# Patient Record
Sex: Male | Born: 1940
Health system: Southern US, Community
[De-identification: ages and names within clinical notes are randomized; demographics above are authoritative.]

## PROBLEM LIST (undated history)

## (undated) DIAGNOSIS — H3530 Unspecified macular degeneration: Secondary | ICD-10-CM

## (undated) DIAGNOSIS — J45909 Unspecified asthma, uncomplicated: Secondary | ICD-10-CM

## (undated) DIAGNOSIS — H9190 Unspecified hearing loss, unspecified ear: Secondary | ICD-10-CM

## (undated) DIAGNOSIS — M722 Plantar fascial fibromatosis: Secondary | ICD-10-CM

## (undated) DIAGNOSIS — E119 Type 2 diabetes mellitus without complications: Secondary | ICD-10-CM

## (undated) HISTORY — PX: EYE SURGERY: SHX253

## (undated) HISTORY — DX: Type 2 diabetes mellitus without complications: E11.9

## (undated) HISTORY — PX: HEMORRHOID SURGERY: SHX153

---

## 1999-04-18 ENCOUNTER — Encounter: Payer: Self-pay | Admitting: Emergency Medicine

## 1999-04-18 ENCOUNTER — Emergency Department (HOSPITAL_COMMUNITY): Admission: EM | Admit: 1999-04-18 | Discharge: 1999-04-18 | Payer: Self-pay | Admitting: Emergency Medicine

## 1999-04-28 ENCOUNTER — Ambulatory Visit (HOSPITAL_COMMUNITY): Admission: RE | Admit: 1999-04-28 | Discharge: 1999-04-28 | Payer: Self-pay | Admitting: Internal Medicine

## 1999-04-28 ENCOUNTER — Encounter: Payer: Self-pay | Admitting: Internal Medicine

## 2001-08-29 ENCOUNTER — Other Ambulatory Visit: Admission: RE | Admit: 2001-08-29 | Discharge: 2001-08-29 | Payer: Self-pay | Admitting: Otolaryngology

## 2001-08-29 ENCOUNTER — Encounter (INDEPENDENT_AMBULATORY_CARE_PROVIDER_SITE_OTHER): Payer: Self-pay | Admitting: Specialist

## 2001-12-11 ENCOUNTER — Ambulatory Visit (HOSPITAL_COMMUNITY): Admission: RE | Admit: 2001-12-11 | Discharge: 2001-12-11 | Payer: Self-pay | Admitting: Ophthalmology

## 2005-06-07 ENCOUNTER — Ambulatory Visit: Payer: Self-pay | Admitting: Pulmonary Disease

## 2009-10-27 ENCOUNTER — Encounter: Admission: RE | Admit: 2009-10-27 | Discharge: 2009-10-27 | Payer: Self-pay | Admitting: Family Medicine

## 2012-01-12 DIAGNOSIS — H3530 Unspecified macular degeneration: Secondary | ICD-10-CM | POA: Diagnosis not present

## 2012-03-09 DIAGNOSIS — L259 Unspecified contact dermatitis, unspecified cause: Secondary | ICD-10-CM | POA: Diagnosis not present

## 2012-08-07 DIAGNOSIS — Z23 Encounter for immunization: Secondary | ICD-10-CM | POA: Diagnosis not present

## 2012-08-13 ENCOUNTER — Emergency Department (HOSPITAL_BASED_OUTPATIENT_CLINIC_OR_DEPARTMENT_OTHER): Payer: Medicare Other

## 2012-08-13 ENCOUNTER — Emergency Department (HOSPITAL_BASED_OUTPATIENT_CLINIC_OR_DEPARTMENT_OTHER)
Admission: EM | Admit: 2012-08-13 | Discharge: 2012-08-13 | Disposition: A | Discharge status: Home / Self Care | Payer: Medicare Other | Attending: Emergency Medicine | Admitting: Emergency Medicine

## 2012-08-13 ENCOUNTER — Encounter (HOSPITAL_BASED_OUTPATIENT_CLINIC_OR_DEPARTMENT_OTHER): Payer: Self-pay | Admitting: Emergency Medicine

## 2012-08-13 DIAGNOSIS — S60229A Contusion of unspecified hand, initial encounter: Secondary | ICD-10-CM | POA: Diagnosis not present

## 2012-08-13 DIAGNOSIS — T148XXA Other injury of unspecified body region, initial encounter: Secondary | ICD-10-CM | POA: Diagnosis not present

## 2012-08-13 DIAGNOSIS — J45909 Unspecified asthma, uncomplicated: Secondary | ICD-10-CM | POA: Diagnosis not present

## 2012-08-13 DIAGNOSIS — IMO0002 Reserved for concepts with insufficient information to code with codable children: Secondary | ICD-10-CM | POA: Insufficient documentation

## 2012-08-13 DIAGNOSIS — M79609 Pain in unspecified limb: Secondary | ICD-10-CM | POA: Diagnosis not present

## 2012-08-13 DIAGNOSIS — R220 Localized swelling, mass and lump, head: Secondary | ICD-10-CM | POA: Diagnosis not present

## 2012-08-13 DIAGNOSIS — Y92009 Unspecified place in unspecified non-institutional (private) residence as the place of occurrence of the external cause: Secondary | ICD-10-CM | POA: Insufficient documentation

## 2012-08-13 DIAGNOSIS — Z79899 Other long term (current) drug therapy: Secondary | ICD-10-CM | POA: Diagnosis not present

## 2012-08-13 DIAGNOSIS — S199XXA Unspecified injury of neck, initial encounter: Secondary | ICD-10-CM | POA: Diagnosis not present

## 2012-08-13 DIAGNOSIS — R221 Localized swelling, mass and lump, neck: Secondary | ICD-10-CM | POA: Diagnosis not present

## 2012-08-13 DIAGNOSIS — S61409A Unspecified open wound of unspecified hand, initial encounter: Secondary | ICD-10-CM | POA: Diagnosis not present

## 2012-08-13 DIAGNOSIS — T1490XA Injury, unspecified, initial encounter: Secondary | ICD-10-CM | POA: Diagnosis not present

## 2012-08-13 DIAGNOSIS — S0003XA Contusion of scalp, initial encounter: Secondary | ICD-10-CM | POA: Diagnosis not present

## 2012-08-13 DIAGNOSIS — S0083XA Contusion of other part of head, initial encounter: Secondary | ICD-10-CM | POA: Diagnosis not present

## 2012-08-13 DIAGNOSIS — R51 Headache: Secondary | ICD-10-CM | POA: Insufficient documentation

## 2012-08-13 DIAGNOSIS — S0990XA Unspecified injury of head, initial encounter: Secondary | ICD-10-CM | POA: Diagnosis not present

## 2012-08-13 DIAGNOSIS — W010XXA Fall on same level from slipping, tripping and stumbling without subsequent striking against object, initial encounter: Secondary | ICD-10-CM | POA: Insufficient documentation

## 2012-08-13 DIAGNOSIS — M19049 Primary osteoarthritis, unspecified hand: Secondary | ICD-10-CM | POA: Diagnosis not present

## 2012-08-13 DIAGNOSIS — S60222A Contusion of left hand, initial encounter: Secondary | ICD-10-CM

## 2012-08-13 HISTORY — DX: Unspecified asthma, uncomplicated: J45.909

## 2012-08-13 MED ORDER — BACITRACIN ZINC 500 UNIT/GM EX OINT: TOPICAL_OINTMENT | Freq: Two times a day (BID) | CUTANEOUS | Status: DC

## 2012-08-13 MED ORDER — BACITRACIN 500 UNIT/GM EX OINT
1.0000 "application " | TOPICAL_OINTMENT | Freq: Two times a day (BID) | CUTANEOUS | Status: DC
  Administered 2012-08-13: 1 via TOPICAL
  Filled 2012-08-13: qty 0.9

## 2012-08-13 MED ORDER — NAPROXEN 500 MG PO TABS: 500.0000 mg | ORAL_TABLET | Freq: Two times a day (BID) | ORAL | Status: DC

## 2012-08-13 NOTE — ED Provider Notes (Signed)
History     CSN: [id number]  Arrival date & time 08/13/12  1153   First MD Initiated Contact with Patient 08/13/12 1215      Chief Complaint  Patient presents with  . Fall    (Consider location/radiation/quality/duration/timing/severity/associated sxs/prior treatment) HPI Comments: 71 year old male presents after a fall which was mechanical, states that he was walking in his garage when his shoe caught and he fell forward landing on his left hand in his left for head. This was acute in onset, the pain is persistent, gradually improving, mild at this time and not associated with weakness, numbness, nausea, vomiting, blurred vision, dizziness. He denies chest pain, shortness of breath, back or neck pain. He was immobilized with a backboard and c-collar prior to arrival by ambulance and had dressings placed over the dorsum of his left hand and his left forehead.  Patient is a 71 y.o. male presenting with fall. The history is provided by the patient and the EMS personnel.  Fall The accident occurred less than 1 hour ago.    Past Medical History  Diagnosis Date  . Asthma     No past surgical history on file.  No family history on file.  History  Substance Use Topics  . Smoking status: Not on file  . Smokeless tobacco: Not on file  . Alcohol Use:       Review of Systems  All other systems reviewed and are negative.    Allergies  Codeine and Prednisone  Home Medications   Current Outpatient Rx  Name Route Sig Dispense Refill  . ALBUTEROL SULFATE HFA 108 (90 BASE) MCG/ACT IN AERS Inhalation Inhale 2 puffs into the lungs every 6 (six) hours as needed.    Marland Kitchen BACITRACIN ZINC 500 UNIT/GM EX OINT Topical Apply topically 2 (two) times daily. 120 g 0  . NAPROXEN 500 MG PO TABS Oral Take 1 tablet (500 mg total) by mouth 2 (two) times daily with a meal. 30 tablet 0    BP 124/78  Pulse 61  Temp 97.9 F (36.6 C) (Oral)  Resp 17  SpO2 97%  Physical Exam  Nursing note and  vitals reviewed. Constitutional: He appears well-developed and well-nourished. No distress.  HENT:  Head: Normocephalic.  Mouth/Throat: Oropharynx is clear and moist. No oropharyngeal exudate.       Contusion and abrasion over the left upper forehead, no malocclusion, no hemotympanum, no raccoon eyes, no battle sign  Eyes: Conjunctivae normal and EOM are normal. Pupils are equal, round, and reactive to light. Right eye exhibits no discharge. Left eye exhibits no discharge. No scleral icterus.  Neck: Normal range of motion. Neck supple. No JVD present. No thyromegaly present.  Cardiovascular: Normal rate, regular rhythm, normal heart sounds and intact distal pulses.  Exam reveals no gallop and no friction rub.   No murmur heard. Pulmonary/Chest: Effort normal and breath sounds normal. No respiratory distress. He has no wheezes. He has no rales.  Abdominal: Soft. Bowel sounds are normal. He exhibits no distension and no mass. There is no tenderness.  Musculoskeletal: Normal range of motion. He exhibits tenderness ( Minimal tenderness over the dorsum of the left hand with associated small skin tear). He exhibits no edema.  Lymphadenopathy:    He has no cervical adenopathy.  Neurological: He is alert. Coordination normal.       Normal gait, normal balance, normal speech, normal extraocular movements, normal strength of the bilateral upper and lower extremities.  Skin: Skin is warm and dry.  There is erythema ( Small hematoma with associated abrasion to the left upper 4 head, no laceration).  Psychiatric: He has a normal mood and affect. His behavior is normal.    ED Course  Procedures (including critical care time)  Labs Reviewed - No data to display Ct Head Wo Contrast  08/13/2012  *RADIOLOGY REPORT*  Clinical Data: Fall with abrasion to left side of forehead. Headache.  CT HEAD WITHOUT CONTRAST  Technique:  Contiguous axial images were obtained from the base of the skull through the vertex  without contrast.  Comparison: None.  Findings: Bone windows demonstrate mild left frontal scalp soft tissue swelling, including image 43 of series 3.  Minimal mucosal thickening of ethmoid air cells.  No underlying skull fracture. Clear mastoid air cells.  Soft tissue windows demonstrate mild low density in the periventricular white matter likely related to small vessel disease. No  mass lesion, hemorrhage, hydrocephalus, acute infarct, intra-axial, or extra-axial fluid collection.  IMPRESSION:  1.  Mild left frontal scalp soft tissue swelling, without acute intracranial abnormality. 2.  Mild small vessel ischemic change. 3.  Minimal sinus disease.   Original Report Authenticated By: Consuello Bossier, M.D.    Dg Hand Complete Left  08/13/2012  *RADIOLOGY REPORT*  Clinical Data:  Left ring finger pain at proximal interphalangeal joint  LEFT HAND - COMPLETE 3+ VIEW  Comparison: None.  Findings: No acute fracture, or malalignment.  Mild soft tissue swelling about the ring finger proximal interphalangeal joint.  The carpus is intact.  Minimal degenerative changes without focality.  IMPRESSION: Negative radiographs of the left hand   Original Report Authenticated By: HEATH      1. Contusion of scalp   2. Minor head injury   3. Contusion of left hand       MDM  Rule out intracranial hemorrhage, suspect superficial skin injuries, patient has normal balance and neurologic exam at this time. According to medical record the patient is not on anticoagulant therapy   CT scan evaluated by myself, I see no signs of intracranial hemorrhage or fracture. Left hand radiographs reveal no sign of fracture. Patient given reassurance, he has normal mental status and is with family members who will watch him today. Wound care provided bacitracin and sterile dressing.     Vida Roller, MD 08/13/12 1352

## 2012-08-13 NOTE — ED Notes (Signed)
Per EMS:  Pt fell this am, tripped on rug.  Pt has abrasion to forehead and skin tears to left hand.  No LOC.

## 2012-10-05 DIAGNOSIS — H6690 Otitis media, unspecified, unspecified ear: Secondary | ICD-10-CM | POA: Diagnosis not present

## 2013-02-06 DIAGNOSIS — R05 Cough: Secondary | ICD-10-CM | POA: Diagnosis not present

## 2013-02-06 DIAGNOSIS — IMO0002 Reserved for concepts with insufficient information to code with codable children: Secondary | ICD-10-CM | POA: Diagnosis not present

## 2013-02-06 DIAGNOSIS — M79609 Pain in unspecified limb: Secondary | ICD-10-CM | POA: Diagnosis not present

## 2013-02-06 DIAGNOSIS — R059 Cough, unspecified: Secondary | ICD-10-CM | POA: Diagnosis not present

## 2013-02-06 DIAGNOSIS — M1710 Unilateral primary osteoarthritis, unspecified knee: Secondary | ICD-10-CM | POA: Diagnosis not present

## 2013-06-16 ENCOUNTER — Emergency Department (HOSPITAL_BASED_OUTPATIENT_CLINIC_OR_DEPARTMENT_OTHER): Payer: Medicare Other

## 2013-06-16 ENCOUNTER — Encounter (HOSPITAL_BASED_OUTPATIENT_CLINIC_OR_DEPARTMENT_OTHER): Payer: Self-pay | Admitting: *Deleted

## 2013-06-16 ENCOUNTER — Emergency Department (HOSPITAL_BASED_OUTPATIENT_CLINIC_OR_DEPARTMENT_OTHER)
Admission: EM | Admit: 2013-06-16 | Discharge: 2013-06-16 | Disposition: A | Discharge status: Home / Self Care | Payer: Medicare Other | Attending: Emergency Medicine | Admitting: Emergency Medicine

## 2013-06-16 DIAGNOSIS — M7730 Calcaneal spur, unspecified foot: Secondary | ICD-10-CM | POA: Diagnosis not present

## 2013-06-16 DIAGNOSIS — Z8669 Personal history of other diseases of the nervous system and sense organs: Secondary | ICD-10-CM | POA: Diagnosis not present

## 2013-06-16 DIAGNOSIS — M79671 Pain in right foot: Secondary | ICD-10-CM

## 2013-06-16 DIAGNOSIS — Z87891 Personal history of nicotine dependence: Secondary | ICD-10-CM | POA: Diagnosis not present

## 2013-06-16 DIAGNOSIS — M79609 Pain in unspecified limb: Secondary | ICD-10-CM | POA: Insufficient documentation

## 2013-06-16 DIAGNOSIS — J45909 Unspecified asthma, uncomplicated: Secondary | ICD-10-CM | POA: Insufficient documentation

## 2013-06-16 HISTORY — DX: Unspecified macular degeneration: H35.30

## 2013-06-16 MED ORDER — TRAMADOL HCL 50 MG PO TABS: 50.0000 mg | ORAL_TABLET | Freq: Four times a day (QID) | ORAL | Status: DC | PRN

## 2013-06-16 MED ORDER — TRAMADOL HCL 50 MG PO TABS
ORAL_TABLET | ORAL | Status: AC
  Filled 2013-06-16: qty 1

## 2013-06-16 MED ORDER — TRAMADOL HCL 50 MG PO TABS
50.0000 mg | ORAL_TABLET | Freq: Once | ORAL | Status: AC
  Administered 2013-06-16: 50 mg via ORAL

## 2013-06-16 MED ORDER — HYDROCODONE-ACETAMINOPHEN 5-325 MG PO TABS
1.0000 | ORAL_TABLET | Freq: Once | ORAL | Status: DC
  Filled 2013-06-16: qty 1

## 2013-06-16 NOTE — ED Provider Notes (Signed)
CSN: [id number]     Arrival date & time 06/16/13  4403 History  This chart was scribed for Doug Sou, MD by Ladona Ridgel Day, ED scribe. This patient was seen in room MH10/MH10 and the patient's care was started at 1947.   First MD Initiated Contact with Patient 06/16/13 1947     Chief Complaint  Patient presents with  . Foot Pain   Patient is a 72 y.o. male presenting with lower extremity pain. The history is provided by the patient. No language interpreter was used.  Foot Pain This is a new problem. The current episode started more than 2 days ago. The problem occurs constantly. The problem has been gradually worsening. Pertinent negatives include no chest pain, no abdominal pain, no headaches and no shortness of breath. Nothing relieves the symptoms. He has tried acetaminophen for the symptoms. The treatment provided no relief.   HPI Comments: Bryan Turner is a 72 y.o. male who presents to the Emergency Department complaining of constant, gradual onset, gradually worsening, 8/10, right foot pain which he localizes to the dorsal aspect of his foot, onset 1 week ago, no recent injuries. He states minimal pain with ranging his right ankle. He states pain is improved when he is walking but recently has been using a cane for assistance to ambulate. He states taking tylenol w/out relief from his foot pain.  He has a hx of mild asthma, takes no regular medicines. He does not smoke or drink alcohol.  He is allergic to codeine and prednisone.  Past Medical History  Diagnosis Date  . Asthma   . Macular degeneration    Past Surgical History  Procedure Laterality Date  . Eye surgery    . Hemorrhoid surgery     No family history on file. History  Substance Use Topics  . Smoking status: Former Smoker    Quit date: 06/16/1992  . Smokeless tobacco: Never Used  . Alcohol Use: No    Review of Systems  Constitutional: Negative.  Negative for fever and chills.  HENT: Negative.  Negative for  congestion.   Respiratory: Negative.  Negative for shortness of breath.   Cardiovascular: Negative.  Negative for chest pain.  Gastrointestinal: Negative.  Negative for nausea, vomiting and abdominal pain.  Musculoskeletal: Negative.  Negative for back pain.       Right dorsal foot pain  Skin: Negative.  Negative for color change and pallor.  Neurological: Negative.  Negative for weakness and headaches.  Psychiatric/Behavioral: Negative.   All other systems reviewed and are negative.   A complete 10 system review of systems was obtained and all systems are negative except as noted in the HPI and PMH.   Allergies  Aspirin; Codeine; and Prednisone  Home Medications   Current Outpatient Rx  Name  Route  Sig  Dispense  Refill  . albuterol (PROVENTIL HFA;VENTOLIN HFA) 108 (90 BASE) MCG/ACT inhaler   Inhalation   Inhale 2 puffs into the lungs every 6 (six) hours as needed.         . bacitracin ointment   Topical   Apply topically 2 (two) times daily.   120 g   0   . naproxen (NAPROSYN) 500 MG tablet   Oral   Take 1 tablet (500 mg total) by mouth 2 (two) times daily with a meal.   30 tablet   0    Triage Vitals: BP 131/73  Pulse 67  Temp(Src) 98 F (36.7 C) (Oral)  Resp 20  Ht 6' (1.829 m)  Wt 227 lb (102.967 kg)  BMI 30.78 kg/m2  SpO2 98% Physical Exam  Nursing note and vitals reviewed. Constitutional: He appears well-developed and well-nourished.  HENT:  Head: Normocephalic and atraumatic.  Eyes: Conjunctivae are normal. Pupils are equal, round, and reactive to light.  Neck: Neck supple. No tracheal deviation present. No thyromegaly present.  Cardiovascular: Normal rate and regular rhythm.   No murmur heard. Pulmonary/Chest: Effort normal and breath sounds normal.  Abdominal: Soft. Bowel sounds are normal. He exhibits no distension. There is no tenderness.  Musculoskeletal: Normal range of motion. He exhibits tenderness. He exhibits no edema.  Right lower  extremity no swelling no redness no temperature differential in comparison to left lower extremity. DP pulse 2+. He is minimally tender at the dorsum of the foot. Good capillary refill. Patient walks with cane has pain at right foot upon walking.  Neurological: He is alert. Coordination normal.  Skin: Skin is warm and dry. No rash noted.  Psychiatric: He has a normal mood and affect.    ED Course   Procedures (including critical care time) DIAGNOSTIC STUDIES: Oxygen Saturation is 98% on room air, normal by my interpretation.    COORDINATION OF CARE: At 806 PM Discussed treatment plan with patient which includes pain medicine, right foot X-ray. Patient agrees.   Labs Reviewed - No data to display No results found. No diagnosis found. 9:40 PM pain improved after treatment with tramadol. X-ray viewed by me No results found for this or any previous visit. Dg Foot Complete Right  06/16/2013   *RADIOLOGY REPORT*  Clinical Data: 72 year old male with right foot pain.  RIGHT FOOT COMPLETE - 3+ VIEW  Comparison: None  Findings: There is no evidence of fracture, subluxation or dislocation. The Lisfranc joints are intact. No focal bony lesions are present. Vascular calcifications are identified. A small calcaneal spur is noted.  IMPRESSION: No evidence of acute abnormality.  Calcaneal spur.   Original Report Authenticated By: Harmon Pier, M.D.    MDM  Plan prescription tramadol He is instructed to go to the local pharmacy to have an orthotic made for his shoe. ReferralDr, Charlsie Merles as needed  Diagnosis right foot pain I personally performed the services described in this documentation, which was scribed in my presence. The recorded information has been reviewed and considered.   Doug Sou, MD 06/16/13 2144

## 2013-06-16 NOTE — ED Notes (Signed)
C/o right foot pain x 1 week- also having leg spasms- denies injury

## 2013-07-04 DIAGNOSIS — M722 Plantar fascial fibromatosis: Secondary | ICD-10-CM | POA: Diagnosis not present

## 2013-07-11 DIAGNOSIS — M722 Plantar fascial fibromatosis: Secondary | ICD-10-CM | POA: Diagnosis not present

## 2013-08-10 DIAGNOSIS — Z23 Encounter for immunization: Secondary | ICD-10-CM | POA: Diagnosis not present

## 2013-11-06 DIAGNOSIS — R05 Cough: Secondary | ICD-10-CM | POA: Diagnosis not present

## 2013-11-06 DIAGNOSIS — J111 Influenza due to unidentified influenza virus with other respiratory manifestations: Secondary | ICD-10-CM | POA: Diagnosis not present

## 2013-11-07 DIAGNOSIS — J111 Influenza due to unidentified influenza virus with other respiratory manifestations: Secondary | ICD-10-CM | POA: Diagnosis not present

## 2013-11-16 ENCOUNTER — Emergency Department (HOSPITAL_BASED_OUTPATIENT_CLINIC_OR_DEPARTMENT_OTHER)
Admission: EM | Admit: 2013-11-16 | Discharge: 2013-11-16 | Disposition: A | Discharge status: Home / Self Care | Payer: Medicare Other | Attending: Emergency Medicine | Admitting: Emergency Medicine

## 2013-11-16 ENCOUNTER — Emergency Department (HOSPITAL_BASED_OUTPATIENT_CLINIC_OR_DEPARTMENT_OTHER): Payer: Medicare Other

## 2013-11-16 ENCOUNTER — Encounter (HOSPITAL_BASED_OUTPATIENT_CLINIC_OR_DEPARTMENT_OTHER): Payer: Self-pay | Admitting: Emergency Medicine

## 2013-11-16 DIAGNOSIS — Z791 Long term (current) use of non-steroidal anti-inflammatories (NSAID): Secondary | ICD-10-CM | POA: Insufficient documentation

## 2013-11-16 DIAGNOSIS — J45901 Unspecified asthma with (acute) exacerbation: Secondary | ICD-10-CM | POA: Diagnosis not present

## 2013-11-16 DIAGNOSIS — Z87891 Personal history of nicotine dependence: Secondary | ICD-10-CM | POA: Insufficient documentation

## 2013-11-16 DIAGNOSIS — R05 Cough: Secondary | ICD-10-CM | POA: Diagnosis not present

## 2013-11-16 DIAGNOSIS — R9431 Abnormal electrocardiogram [ECG] [EKG]: Secondary | ICD-10-CM | POA: Diagnosis not present

## 2013-11-16 DIAGNOSIS — R6883 Chills (without fever): Secondary | ICD-10-CM | POA: Diagnosis not present

## 2013-11-16 DIAGNOSIS — J209 Acute bronchitis, unspecified: Secondary | ICD-10-CM | POA: Diagnosis not present

## 2013-11-16 DIAGNOSIS — Z8669 Personal history of other diseases of the nervous system and sense organs: Secondary | ICD-10-CM | POA: Insufficient documentation

## 2013-11-16 DIAGNOSIS — Z79899 Other long term (current) drug therapy: Secondary | ICD-10-CM | POA: Insufficient documentation

## 2013-11-16 DIAGNOSIS — R059 Cough, unspecified: Secondary | ICD-10-CM | POA: Diagnosis not present

## 2013-11-16 LAB — CBC WITH DIFFERENTIAL/PLATELET
BASOS ABS: 0 10*3/uL (ref 0.0–0.1)
Basophils Relative: 0 % (ref 0–1)
Eosinophils Absolute: 0.1 10*3/uL (ref 0.0–0.7)
Eosinophils Relative: 0 % (ref 0–5)
HEMATOCRIT: 40.6 % (ref 39.0–52.0)
HEMOGLOBIN: 13.8 g/dL (ref 13.0–17.0)
LYMPHS PCT: 30 % (ref 12–46)
Lymphs Abs: 5.7 10*3/uL — ABNORMAL HIGH (ref 0.7–4.0)
MCH: 30.6 pg (ref 26.0–34.0)
MCHC: 34 g/dL (ref 30.0–36.0)
MCV: 90 fL (ref 78.0–100.0)
MONO ABS: 1.8 10*3/uL — AB (ref 0.1–1.0)
MONOS PCT: 9 % (ref 3–12)
NEUTROS ABS: 11.6 10*3/uL — AB (ref 1.7–7.7)
Neutrophils Relative %: 61 % (ref 43–77)
Platelets: 221 10*3/uL (ref 150–400)
RBC: 4.51 MIL/uL (ref 4.22–5.81)
RDW: 12.7 % (ref 11.5–15.5)
WBC: 19.2 10*3/uL — AB (ref 4.0–10.5)

## 2013-11-16 LAB — COMPREHENSIVE METABOLIC PANEL
ALK PHOS: 52 U/L (ref 39–117)
ALT: 16 U/L (ref 0–53)
AST: 20 U/L (ref 0–37)
Albumin: 3.4 g/dL — ABNORMAL LOW (ref 3.5–5.2)
BILIRUBIN TOTAL: 1.3 mg/dL — AB (ref 0.3–1.2)
BUN: 11 mg/dL (ref 6–23)
CHLORIDE: 100 meq/L (ref 96–112)
CO2: 22 meq/L (ref 19–32)
CREATININE: 1 mg/dL (ref 0.50–1.35)
Calcium: 8.9 mg/dL (ref 8.4–10.5)
GFR calc Af Amer: 84 mL/min — ABNORMAL LOW (ref >90)
GFR, EST NON AFRICAN AMERICAN: 73 mL/min — AB (ref >90)
Glucose, Bld: 244 mg/dL — ABNORMAL HIGH (ref 70–99)
Potassium: 3.9 mEq/L (ref 3.7–5.3)
Sodium: 140 mEq/L (ref 137–147)
Total Protein: 6.9 g/dL (ref 6.0–8.3)

## 2013-11-16 LAB — TROPONIN I: Troponin I: <0.3 ng/mL (ref <0.30)

## 2013-11-16 MED ORDER — ALBUTEROL SULFATE HFA 108 (90 BASE) MCG/ACT IN AERS
2.0000 | INHALATION_SPRAY | RESPIRATORY_TRACT | Status: DC | PRN
  Administered 2013-11-16: 2 via RESPIRATORY_TRACT
  Filled 2013-11-16: qty 6.7

## 2013-11-16 MED ORDER — IPRATROPIUM BROMIDE 0.02 % IN SOLN
0.5000 mg | Freq: Once | RESPIRATORY_TRACT | Status: AC
  Administered 2013-11-16: 0.5 mg via RESPIRATORY_TRACT
  Filled 2013-11-16: qty 2.5

## 2013-11-16 MED ORDER — AZITHROMYCIN 250 MG PO TABS: ORAL_TABLET | ORAL | Status: DC

## 2013-11-16 MED ORDER — ALBUTEROL SULFATE (2.5 MG/3ML) 0.083% IN NEBU
5.0000 mg | INHALATION_SOLUTION | Freq: Once | RESPIRATORY_TRACT | Status: AC
  Administered 2013-11-16: 5 mg via RESPIRATORY_TRACT
  Filled 2013-11-16: qty 6

## 2013-11-16 NOTE — ED Notes (Signed)
Coughing and wheezing for the last twenty four hours.  Recently diagnosed with the flu which had resolved.

## 2013-11-16 NOTE — Discharge Instructions (Signed)
Zithromax as prescribed.  Albuterol inhaler 2 puffs every 4 hours as needed for wheezing.  Return to the emergency department if you develop chest pain, difficulty breathing, or other new or concerning symptoms.   Acute Bronchitis Bronchitis is inflammation of the airways that extend from the windpipe into the lungs (bronchi). The inflammation often causes mucus to develop. This leads to a cough, which is the most common symptom of bronchitis.  In acute bronchitis, the condition usually develops suddenly and goes away over time, usually in a couple weeks. Smoking, allergies, and asthma can make bronchitis worse. Repeated episodes of bronchitis may cause further lung problems.  CAUSES Acute bronchitis is most often caused by the same virus that causes a cold. The virus can spread from person to person (contagious).  SIGNS AND SYMPTOMS   Cough.   Fever.   Coughing up mucus.   Body aches.   Chest congestion.   Chills.   Shortness of breath.   Sore throat.  DIAGNOSIS  Acute bronchitis is usually diagnosed through a physical exam. Tests, such as chest X-rays, are sometimes done to rule out other conditions.  TREATMENT  Acute bronchitis usually goes away in a couple weeks. Often times, no medical treatment is necessary. Medicines are sometimes given for relief of fever or cough. Antibiotics are usually not needed but may be prescribed in certain situations. In some cases, an inhaler may be recommended to help reduce shortness of breath and control the cough. A cool mist vaporizer may also be used to help thin bronchial secretions and make it easier to clear the chest.  HOME CARE INSTRUCTIONS  Get plenty of rest.   Drink enough fluids to keep your urine clear or pale yellow (unless you have a medical condition that requires fluid restriction). Increasing fluids may help thin your secretions and will prevent dehydration.   Only take over-the-counter or prescription medicines  as directed by your health care provider.   Avoid smoking and secondhand smoke. Exposure to cigarette smoke or irritating chemicals will make bronchitis worse. If you are a smoker, consider using nicotine gum or skin patches to help control withdrawal symptoms. Quitting smoking will help your lungs heal faster.   Reduce the chances of another bout of acute bronchitis by washing your hands frequently, avoiding people with cold symptoms, and trying not to touch your hands to your mouth, nose, or eyes.   Follow up with your health care provider as directed.  SEEK MEDICAL CARE IF: Your symptoms do not improve after 1 week of treatment.  SEEK IMMEDIATE MEDICAL CARE IF:  You develop an increased fever or chills.   You have chest pain.   You have severe shortness of breath.  You have bloody sputum.   You develop dehydration.  You develop fainting.  You develop repeated vomiting.  You develop a severe headache. MAKE SURE YOU:   Understand these instructions.  Will watch your condition.  Will get help right away if you are not doing well or get worse. Document Released: 12/09/2004 Document Revised: 07/04/2013 Document Reviewed: 04/24/2013 [facility] Patient Information 2014 [place].

## 2013-11-16 NOTE — ED Provider Notes (Signed)
CSN: [id number]     Arrival date & time 11/16/13  0998 History   First MD Initiated Contact with Patient 11/16/13 1021     Chief Complaint  Patient presents with  . Cough   (Consider location/radiation/quality/duration/timing/severity/associated sxs/prior Treatment) HPI Comments: Patient is a 73 year old male with history of asthma. He presents today with complaints of chest congestion, cough, and wheezing which is worsened over the past 24 hours. He was diagnosed 11 days ago with influenza and was improving until 2 days ago. He denies fevers but does state that he is tight in the chest and back. This is worse with breathing. He denies any leg pain or swelling.  Patient is a 73 y.o. male presenting with cough. The history is provided by the patient.  Cough Cough characteristics:  Productive Sputum characteristics:  Clear Severity:  Moderate Duration:  10 days Timing:  Constant Progression:  Worsening Chronicity:  New Worsened by:  Nothing tried Ineffective treatments:  None tried Associated symptoms: chills   Associated symptoms: no chest pain and no fever     Past Medical History  Diagnosis Date  . Asthma   . Macular degeneration    Past Surgical History  Procedure Laterality Date  . Eye surgery    . Hemorrhoid surgery     No family history on file. History  Substance Use Topics  . Smoking status: Former Smoker    Quit date: 06/16/1992  . Smokeless tobacco: Never Used  . Alcohol Use: No    Review of Systems  Constitutional: Positive for chills. Negative for fever.  Respiratory: Positive for cough.   Cardiovascular: Negative for chest pain.  All other systems reviewed and are negative.    Allergies  Aspirin; Codeine; and Prednisone  Home Medications   Current Outpatient Rx  Name  Route  Sig  Dispense  Refill  . albuterol (PROVENTIL HFA;VENTOLIN HFA) 108 (90 BASE) MCG/ACT inhaler   Inhalation   Inhale 2 puffs into the lungs every 6 (six) hours as needed.          . bacitracin ointment   Topical   Apply topically 2 (two) times daily.   120 g   0   . naproxen (NAPROSYN) 500 MG tablet   Oral   Take 1 tablet (500 mg total) by mouth 2 (two) times daily with a meal.   30 tablet   0   . traMADol (ULTRAM) 50 MG tablet   Oral   Take 1 tablet (50 mg total) by mouth every 6 (six) hours as needed for pain.   15 tablet   0    BP 117/69  Temp(Src) 98.8 F (37.1 C) (Oral)  Resp 22  SpO2 98% Physical Exam  Nursing note and vitals reviewed. Constitutional: He is oriented to person, place, and time. He appears well-developed and well-nourished. No distress.  HENT:  Head: Normocephalic and atraumatic.  Mouth/Throat: Oropharynx is clear and moist.  Neck: Normal range of motion. Neck supple.  Cardiovascular: Normal rate, regular rhythm and normal heart sounds.   No murmur heard. Pulmonary/Chest: Effort normal and breath sounds normal. No respiratory distress. He has no wheezes.  Abdominal: Soft. Bowel sounds are normal. He exhibits no distension. There is no tenderness.  Musculoskeletal: Normal range of motion. He exhibits no edema.  Lymphadenopathy:    He has no cervical adenopathy.  Neurological: He is alert and oriented to person, place, and time.  Skin: Skin is warm and dry. He is not diaphoretic.  ED Course  Procedures (including critical care time) Labs Review Labs Reviewed - No data to display Imaging Review No results found.  EKG Interpretation    Date/Time:  Friday November 16 2013 10:57:43 EST Ventricular Rate:  76 PR Interval:  190 QRS Duration: 122 QT Interval:  418 QTC Calculation: 470 R Axis:   41 Text Interpretation:  Normal sinus rhythm Right bundle branch block Abnormal ECG Confirmed by DELOS  MD, Gorman Safi ([id number]) on 11/16/2013 12:13:45 PM            MDM  No diagnosis found. Patient is a 73 year old male presents with persistent cough for 10 days. He was diagnosed with influenza A and seemed to improve  until 2 days ago. He is now having a productive cough. His workup reveals no evidence for cardiac etiology and chest x-ray is negative for pneumonia. I suspect a superimposed bronchitis and we'll treat with Zithromax. He is having no hypoxia and his respiratory status is stable. I believe he is stable for discharge and he is to return to the emergency department if his symptoms worsen or change.    Veryl Speak, MD 11/16/13 1215

## 2013-11-21 ENCOUNTER — Encounter: Payer: Self-pay | Admitting: Podiatry

## 2013-11-21 ENCOUNTER — Ambulatory Visit (INDEPENDENT_AMBULATORY_CARE_PROVIDER_SITE_OTHER): Payer: Medicare Other | Admitting: Podiatry

## 2013-11-21 VITALS — BP 99/81 | HR 86 | Resp 12

## 2013-11-21 DIAGNOSIS — M722 Plantar fascial fibromatosis: Secondary | ICD-10-CM

## 2013-11-21 MED ORDER — TRIAMCINOLONE ACETONIDE 10 MG/ML IJ SUSP
10.0000 mg | Freq: Once | INTRAMUSCULAR | Status: AC
  Administered 2013-11-21: 10 mg

## 2013-11-22 NOTE — Progress Notes (Signed)
Subjective:     Patient ID: Bryan Turner, male   DOB: 20-Jun-1941, 73 y.o.   MRN: [id number]  HPI patient states that his right heel is still hurting. Slightly better but worse when he gets up in the morning and after. To sitting and feels like it needs to be stretched more   Review of Systems     Objective:   Physical Exam Neurovascular status unchanged with pain in the medial fascially band at its insertion into the calcaneus and slightly into the central band    Assessment:     Continued plantar fasciitis right that is failing to respond to brace and stretching exercises    Plan:     Reinjected the plantar fascia 3 mg Kenalog Xylocaine mixture and went ahead and dispensed night splint with instructions on usage. He will be reevaluated in 4-6 weeks and may require custom orthotic devices

## 2013-11-24 ENCOUNTER — Emergency Department (HOSPITAL_BASED_OUTPATIENT_CLINIC_OR_DEPARTMENT_OTHER): Payer: Medicare Other

## 2013-11-24 ENCOUNTER — Emergency Department (HOSPITAL_BASED_OUTPATIENT_CLINIC_OR_DEPARTMENT_OTHER)
Admission: EM | Admit: 2013-11-24 | Discharge: 2013-11-24 | Disposition: A | Discharge status: Home / Self Care | Payer: Medicare Other | Attending: Emergency Medicine | Admitting: Emergency Medicine

## 2013-11-24 ENCOUNTER — Encounter (HOSPITAL_BASED_OUTPATIENT_CLINIC_OR_DEPARTMENT_OTHER): Payer: Self-pay | Admitting: Emergency Medicine

## 2013-11-24 DIAGNOSIS — Z8669 Personal history of other diseases of the nervous system and sense organs: Secondary | ICD-10-CM | POA: Insufficient documentation

## 2013-11-24 DIAGNOSIS — Z87891 Personal history of nicotine dependence: Secondary | ICD-10-CM | POA: Insufficient documentation

## 2013-11-24 DIAGNOSIS — S298XXA Other specified injuries of thorax, initial encounter: Secondary | ICD-10-CM | POA: Diagnosis not present

## 2013-11-24 DIAGNOSIS — J45909 Unspecified asthma, uncomplicated: Secondary | ICD-10-CM | POA: Diagnosis not present

## 2013-11-24 DIAGNOSIS — S20219A Contusion of unspecified front wall of thorax, initial encounter: Secondary | ICD-10-CM | POA: Diagnosis not present

## 2013-11-24 DIAGNOSIS — R1011 Right upper quadrant pain: Secondary | ICD-10-CM | POA: Diagnosis not present

## 2013-11-24 DIAGNOSIS — Y939 Activity, unspecified: Secondary | ICD-10-CM | POA: Insufficient documentation

## 2013-11-24 DIAGNOSIS — Z79899 Other long term (current) drug therapy: Secondary | ICD-10-CM | POA: Insufficient documentation

## 2013-11-24 DIAGNOSIS — S20211A Contusion of right front wall of thorax, initial encounter: Secondary | ICD-10-CM

## 2013-11-24 DIAGNOSIS — W19XXXA Unspecified fall, initial encounter: Secondary | ICD-10-CM

## 2013-11-24 DIAGNOSIS — Y92009 Unspecified place in unspecified non-institutional (private) residence as the place of occurrence of the external cause: Secondary | ICD-10-CM | POA: Insufficient documentation

## 2013-11-24 DIAGNOSIS — R079 Chest pain, unspecified: Secondary | ICD-10-CM | POA: Diagnosis not present

## 2013-11-24 DIAGNOSIS — W1809XA Striking against other object with subsequent fall, initial encounter: Secondary | ICD-10-CM | POA: Insufficient documentation

## 2013-11-24 MED ORDER — TRAMADOL HCL 50 MG PO TABS: 50.0000 mg | ORAL_TABLET | Freq: Four times a day (QID) | ORAL | Status: AC | PRN

## 2013-11-24 NOTE — ED Provider Notes (Signed)
Medical screening examination/treatment/procedure(s) were conducted as a shared visit with non-physician practitioner(s) or resident  and myself.  I personally evaluated the patient during the encounter and agree with the findings and plan unless otherwise indicated.    I have personally reviewed any xrays and/ or EKG's with the provider and I agree with interpretation.   Right anterior rib pain since falling over tub on Thursday. Pain with palpation. Exam right lower anterior rib pain with palpation, no step off, abd soft, no focal tenderness.  Pain not worse with eating but with coughing/ breathing.   Bedside US no free fluid in abdo.  Discussed reasons to return, pt has fup with pcp for type II DM this week.   Ultrasound limited abdominal and limited transthoracic ultrasound (FAST)  Indication: Right flank pain, fall Four views were obtained using the low frequency transducer: Splenorenal, Hepatorenal, Retrovesical, Pericardial subxyphoid Interpretation: No free fluid visualized surrounding the kidneys, pelvis or pericardium. Images archived electronically Dr. Reather Converse personally performed and interpreted the images  See NP note for further details, well appearing in ED.   Bryan Clonts, MD 11/25/13 [phone]

## 2013-11-24 NOTE — ED Provider Notes (Signed)
CSN: [id number]     Arrival date & time 11/24/13  1200 History   First MD Initiated Contact with Patient 11/24/13 1222     Chief Complaint  Patient presents with  . Fall  . Rib Injury   (Consider location/radiation/quality/duration/timing/severity/associated sxs/prior Treatment) Patient is a 73 y.o. male presenting with fall. The history is provided by the patient.  Fall This is a new problem. The current episode started in the past 7 days. The problem occurs constantly. The problem has been gradually worsening. Pertinent negatives include no anorexia, congestion, diaphoresis, fever, headaches, nausea, neck pain or vomiting. Abdominal pain: right upper at rib edge. The symptoms are aggravated by twisting and coughing.   Bryan Turner is a 73 y.o. male who presents to the ED with right side chest and rib pain. He fell in the bath tub 2 days ago and hit his right side. He has continued to have increased pain in the right lower chest area. Pain is worse with movement and deep breath. He took Tramadol that he had from a previous injury but but continues to wake up at night with pain. He denies nausea, vomiting, shortness of breat or cardiac problems.   Past Medical History  Diagnosis Date  . Asthma   . Macular degeneration    Past Surgical History  Procedure Laterality Date  . Eye surgery    . Hemorrhoid surgery     No family history on file. History  Substance Use Topics  . Smoking status: Former Smoker    Quit date: 06/16/1992  . Smokeless tobacco: Never Used  . Alcohol Use: No    Review of Systems  Constitutional: Negative for fever and diaphoresis.  HENT: Negative for congestion.   Eyes: Negative for visual disturbance.  Respiratory: Negative for chest tightness.   Cardiovascular: Negative for leg swelling.  Gastrointestinal: Negative for nausea, vomiting and anorexia. Abdominal pain: right upper at rib edge.  Musculoskeletal: Negative for neck pain.  Skin: Negative for  wound.  Neurological: Negative for dizziness and headaches.  Psychiatric/Behavioral: Negative for confusion. The patient is not nervous/anxious.     Allergies  Aspirin; Codeine; and Prednisone  Home Medications   Current Outpatient Rx  Name  Route  Sig  Dispense  Refill  . albuterol (PROVENTIL HFA;VENTOLIN HFA) 108 (90 BASE) MCG/ACT inhaler   Inhalation   Inhale 2 puffs into the lungs every 6 (six) hours as needed.         . traMADol (ULTRAM) 50 MG tablet   Oral   Take 1 tablet (50 mg total) by mouth every 6 (six) hours as needed for pain.   15 tablet   0   . ZOSTAVAX 10175 UNT/0.65ML injection                BP 136/77  Pulse 59  Temp(Src) 98.7 F (37.1 C) (Oral)  Resp 20  SpO2 97% Physical Exam  Nursing note and vitals reviewed. Constitutional: He is oriented to person, place, and time. He appears well-developed and well-nourished. No distress.  HENT:  Head: Normocephalic and atraumatic.  Eyes: Conjunctivae and EOM are normal.  Neck: Normal range of motion. Neck supple.  Cardiovascular: Normal rate and regular rhythm.   Pulmonary/Chest: Effort normal. Wheezes: occasional  He exhibits tenderness (right).  Right anterior rib pain.  Abdominal: Soft. Bowel sounds are normal. There is tenderness in the right upper quadrant. There is no rebound, no guarding and no CVA tenderness.  Musculoskeletal: Normal range of motion.  Neurological: He is alert and oriented to person, place, and time. No cranial nerve deficit.  Skin: Skin is warm and dry.  Psychiatric: He has a normal mood and affect. His behavior is normal.   Dg Chest 2 View  11/24/2013   CLINICAL DATA:  Fall, right chest pain  EXAM: CHEST  2 VIEW  COMPARISON:  11/16/2013  FINDINGS: The heart size and mediastinal contours are within normal limits. Both lungs are clear. The visualized skeletal structures are unremarkable. Minor diffuse thoracic degenerative change. Stable exam.  IMPRESSION: No active  cardiopulmonary disease.   Electronically Signed   By: Daryll Brod M.D.   On: 11/24/2013 12:43    ED Course: Dr. Reather Converse in to examine the patient and ultrasound.  Procedures  MDM  73 y.o. male with right anterior rib pain s/p fall. Will treat pain and he will return for worsening symptoms. No concern for abdominal hematoma or liver contusion at this time. Detailed instructions on return, signs and symptoms to watch for. He voices understanding.    Medication List    TAKE these medications       traMADol 50 MG tablet  Commonly known as:  ULTRAM  Take 1 tablet (50 mg total) by mouth every 6 (six) hours as needed.      ASK your doctor about these medications       albuterol 108 (90 BASE) MCG/ACT inhaler  Commonly known as:  PROVENTIL HFA;VENTOLIN HFA  Inhale 2 puffs into the lungs every 6 (six) hours as needed.     ZOSTAVAX 16109 UNT/0.65ML injection  Generic drug:  zoster vaccine live (PF)           Ashley Murrain, NP 11/26/13 1221

## 2013-11-24 NOTE — Discharge Instructions (Signed)
Thank you for allowing me to take care of your today. It was very nice to meet you and your family.  Continue your Tramadol as needed for pain. Apply ice to the area. Follow up with your doctor as scheduled. Return here as needed.

## 2013-11-24 NOTE — ED Notes (Signed)
Patient reports that he fell over lip of tub on Thursday landing on right rib area, no loc. Pain increased with inspiration and any movement

## 2013-11-28 DIAGNOSIS — IMO0001 Reserved for inherently not codable concepts without codable children: Secondary | ICD-10-CM | POA: Diagnosis not present

## 2013-11-28 NOTE — ED Provider Notes (Signed)
Medical screening examination/treatment/procedure(s) were conducted as a shared visit with non-physician practitioner(s) or resident  and myself.  I personally evaluated the patient during the encounter and agree with the findings and plan unless otherwise indicated.    I have personally reviewed any xrays and/ or EKG's with the provider and I agree with interpretation.   See my note.   Mariea Clonts, MD 11/28/13 [phone]

## 2013-12-07 ENCOUNTER — Encounter: Payer: Medicare Other | Attending: Family Medicine

## 2013-12-07 VITALS — Ht 72.0 in | Wt 217.4 lb

## 2013-12-07 DIAGNOSIS — E119 Type 2 diabetes mellitus without complications: Secondary | ICD-10-CM | POA: Diagnosis not present

## 2013-12-07 DIAGNOSIS — Z713 Dietary counseling and surveillance: Secondary | ICD-10-CM | POA: Insufficient documentation

## 2013-12-12 NOTE — Progress Notes (Signed)
Patient was seen on 12/07/13 for the first of a series of three diabetes self-management courses at the [facility].  Current HbA1c: not available  The following learning objectives were met by the patient during this class:  Describe diabetes  State some common risk factors for diabetes  Defines the role of glucose and insulin  Identifies type of diabetes and pathophysiology  Describe the relationship between diabetes and cardiovascular risk  State the members of the Healthcare Team  States the rationale for glucose monitoring  State when to test glucose  State their individual Target Range  State the importance of logging glucose readings  Describe how to interpret glucose readings  Identifies A1C target  Explain the correlation between A1c and eAG values  State symptoms and treatment of high blood glucose  State symptoms and treatment of low blood glucose  Explain proper technique for glucose testing  Identifies proper sharps disposal  Handouts given during class include:  Living Well with Diabetes book  Carb Counting and Meal Planning book  Meal Plan Card  Carbohydrate guide  Meal planning worksheet  Low Sodium Flavoring Tips  The diabetes portion plate  O3J to eAG Conversion Chart  Diabetes Medications  Diabetes Recommended Care Schedule  Support Group  Diabetes Success Plan  Core Class Satisfaction Survey  Follow-Up Plan:  Attend core 2

## 2013-12-21 ENCOUNTER — Encounter: Payer: Medicare Other | Attending: Family Medicine

## 2013-12-21 DIAGNOSIS — E119 Type 2 diabetes mellitus without complications: Secondary | ICD-10-CM | POA: Diagnosis not present

## 2013-12-21 DIAGNOSIS — Z713 Dietary counseling and surveillance: Secondary | ICD-10-CM | POA: Diagnosis not present

## 2013-12-25 NOTE — Progress Notes (Signed)
Patient was seen on 11/20/13 for the third of a series of three diabetes self-management courses at the [facility]. The following learning objectives were met by the patient during this class:    State the amount of activity recommended for healthy living   Describe activities suitable for individual needs   Identify ways to regularly incorporate activity into daily life   Identify barriers to activity and ways to over come these barriers  Identify diabetes medications being personally used and their primary action for lowering glucose and possible side effects   Describe role of stress on blood glucose and develop strategies to address psychosocial issues   Identify diabetes complications and ways to prevent them  Explain how to manage diabetes during illness   Evaluate success in meeting personal goal   Establish 2-3 goals that they will plan to diligently work on until they return for the  57-monthfollow-up visit  Goals:  Follow Diabetes Meal Plan as instructed  Aim for 15-30 mins of physical activity daily as tolerated  Bring food record and glucose log to your follow up visit  Your patient has established the following 4 month goals in their individualized success plan:  Count carbs at most meal and snacks  Reduce fat in my diet  Increase my activity at least 7 days a week by walking  Take DM medications as scheduled  Test glucose at least 4 days a week  To help manage my stress, I will Walk at least 5 times a week  Your patient has identified these potential barriers to change:  None noted  Your patient has identified their diabetes self-care support plan as  DSME support Group

## 2014-01-17 DIAGNOSIS — E119 Type 2 diabetes mellitus without complications: Secondary | ICD-10-CM | POA: Diagnosis not present

## 2014-01-17 DIAGNOSIS — H3530 Unspecified macular degeneration: Secondary | ICD-10-CM | POA: Diagnosis not present

## 2014-02-21 DIAGNOSIS — IMO0001 Reserved for inherently not codable concepts without codable children: Secondary | ICD-10-CM | POA: Diagnosis not present

## 2014-02-26 DIAGNOSIS — M25569 Pain in unspecified knee: Secondary | ICD-10-CM | POA: Diagnosis not present

## 2014-02-26 DIAGNOSIS — E119 Type 2 diabetes mellitus without complications: Secondary | ICD-10-CM | POA: Diagnosis not present

## 2014-03-18 ENCOUNTER — Ambulatory Visit: Payer: PRIVATE HEALTH INSURANCE

## 2014-03-28 ENCOUNTER — Encounter (HOSPITAL_COMMUNITY): Admission: EM | Disposition: A | Discharge status: Home Health | Payer: Self-pay | Source: Home / Self Care

## 2014-03-28 ENCOUNTER — Observation Stay (HOSPITAL_COMMUNITY): Payer: Medicare Other | Admitting: Anesthesiology

## 2014-03-28 ENCOUNTER — Encounter (HOSPITAL_COMMUNITY): Payer: Medicare Other | Admitting: Anesthesiology

## 2014-03-28 ENCOUNTER — Inpatient Hospital Stay (HOSPITAL_BASED_OUTPATIENT_CLINIC_OR_DEPARTMENT_OTHER)
Admission: EM | Admit: 2014-03-28 | Discharge: 2014-03-31 | DRG: 418 | Disposition: A | Discharge status: Home Health | Payer: Medicare Other | Attending: General Surgery | Admitting: General Surgery

## 2014-03-28 ENCOUNTER — Observation Stay (HOSPITAL_COMMUNITY): Payer: Medicare Other

## 2014-03-28 ENCOUNTER — Emergency Department (HOSPITAL_BASED_OUTPATIENT_CLINIC_OR_DEPARTMENT_OTHER): Payer: Medicare Other

## 2014-03-28 ENCOUNTER — Encounter (HOSPITAL_BASED_OUTPATIENT_CLINIC_OR_DEPARTMENT_OTHER): Payer: Self-pay | Admitting: Emergency Medicine

## 2014-03-28 DIAGNOSIS — R5383 Other fatigue: Secondary | ICD-10-CM | POA: Diagnosis present

## 2014-03-28 DIAGNOSIS — K819 Cholecystitis, unspecified: Secondary | ICD-10-CM | POA: Diagnosis present

## 2014-03-28 DIAGNOSIS — M545 Low back pain, unspecified: Secondary | ICD-10-CM | POA: Diagnosis present

## 2014-03-28 DIAGNOSIS — K560 Paralytic ileus: Secondary | ICD-10-CM | POA: Diagnosis not present

## 2014-03-28 DIAGNOSIS — Z9181 History of falling: Secondary | ICD-10-CM | POA: Diagnosis not present

## 2014-03-28 DIAGNOSIS — E119 Type 2 diabetes mellitus without complications: Secondary | ICD-10-CM | POA: Diagnosis present

## 2014-03-28 DIAGNOSIS — H9190 Unspecified hearing loss, unspecified ear: Secondary | ICD-10-CM | POA: Diagnosis present

## 2014-03-28 DIAGNOSIS — K8066 Calculus of gallbladder and bile duct with acute and chronic cholecystitis without obstruction: Secondary | ICD-10-CM | POA: Diagnosis not present

## 2014-03-28 DIAGNOSIS — Z87891 Personal history of nicotine dependence: Secondary | ICD-10-CM | POA: Diagnosis not present

## 2014-03-28 DIAGNOSIS — K8010 Calculus of gallbladder with chronic cholecystitis without obstruction: Secondary | ICD-10-CM | POA: Diagnosis present

## 2014-03-28 DIAGNOSIS — K8020 Calculus of gallbladder without cholecystitis without obstruction: Secondary | ICD-10-CM | POA: Diagnosis not present

## 2014-03-28 DIAGNOSIS — Z79899 Other long term (current) drug therapy: Secondary | ICD-10-CM

## 2014-03-28 DIAGNOSIS — H3530 Unspecified macular degeneration: Secondary | ICD-10-CM | POA: Diagnosis present

## 2014-03-28 DIAGNOSIS — K8000 Calculus of gallbladder with acute cholecystitis without obstruction: Principal | ICD-10-CM | POA: Diagnosis present

## 2014-03-28 DIAGNOSIS — K810 Acute cholecystitis: Secondary | ICD-10-CM | POA: Diagnosis not present

## 2014-03-28 DIAGNOSIS — R5381 Other malaise: Secondary | ICD-10-CM | POA: Diagnosis not present

## 2014-03-28 DIAGNOSIS — R1011 Right upper quadrant pain: Secondary | ICD-10-CM | POA: Diagnosis not present

## 2014-03-28 DIAGNOSIS — J45909 Unspecified asthma, uncomplicated: Secondary | ICD-10-CM | POA: Diagnosis present

## 2014-03-28 HISTORY — PX: CHOLECYSTECTOMY: SHX55

## 2014-03-28 LAB — CBC WITH DIFFERENTIAL/PLATELET
BASOS ABS: 0 10*3/uL (ref 0.0–0.1)
BASOS PCT: 0 % (ref 0–1)
EOS ABS: 0.1 10*3/uL (ref 0.0–0.7)
EOS PCT: 1 % (ref 0–5)
HEMATOCRIT: 39.6 % (ref 39.0–52.0)
Hemoglobin: 13.4 g/dL (ref 13.0–17.0)
Lymphocytes Relative: 21 % (ref 12–46)
Lymphs Abs: 2.5 10*3/uL (ref 0.7–4.0)
MCH: 31.8 pg (ref 26.0–34.0)
MCHC: 33.8 g/dL (ref 30.0–36.0)
MCV: 94.1 fL (ref 78.0–100.0)
MONO ABS: 1.7 10*3/uL — AB (ref 0.1–1.0)
MONOS PCT: 14 % — AB (ref 3–12)
Neutro Abs: 7.6 10*3/uL (ref 1.7–7.7)
Neutrophils Relative %: 64 % (ref 43–77)
Platelets: 189 10*3/uL (ref 150–400)
RBC: 4.21 MIL/uL — ABNORMAL LOW (ref 4.22–5.81)
RDW: 13.5 % (ref 11.5–15.5)
WBC: 12 10*3/uL — ABNORMAL HIGH (ref 4.0–10.5)

## 2014-03-28 LAB — COMPREHENSIVE METABOLIC PANEL
ALBUMIN: 3.8 g/dL (ref 3.5–5.2)
ALT: 19 U/L (ref 0–53)
AST: 21 U/L (ref 0–37)
Alkaline Phosphatase: 41 U/L (ref 39–117)
BUN: 18 mg/dL (ref 6–23)
CALCIUM: 9.2 mg/dL (ref 8.4–10.5)
CO2: 24 mEq/L (ref 19–32)
CREATININE: 1.1 mg/dL (ref 0.50–1.35)
Chloride: 105 mEq/L (ref 96–112)
GFR calc Af Amer: 74 mL/min — ABNORMAL LOW (ref >90)
GFR calc non Af Amer: 64 mL/min — ABNORMAL LOW (ref >90)
Glucose, Bld: 185 mg/dL — ABNORMAL HIGH (ref 70–99)
Potassium: 4.5 mEq/L (ref 3.7–5.3)
Sodium: 143 mEq/L (ref 137–147)
Total Bilirubin: 0.9 mg/dL (ref 0.3–1.2)
Total Protein: 6.9 g/dL (ref 6.0–8.3)

## 2014-03-28 LAB — URINALYSIS, ROUTINE W REFLEX MICROSCOPIC
GLUCOSE, UA: NEGATIVE mg/dL
HGB URINE DIPSTICK: NEGATIVE
KETONES UR: 15 mg/dL — AB
LEUKOCYTES UA: NEGATIVE
Nitrite: NEGATIVE
PROTEIN: NEGATIVE mg/dL
Specific Gravity, Urine: 1.027 (ref 1.005–1.030)
Urobilinogen, UA: 0.2 mg/dL (ref 0.0–1.0)
pH: 5 (ref 5.0–8.0)

## 2014-03-28 LAB — GLUCOSE, CAPILLARY: Glucose-Capillary: 157 mg/dL — ABNORMAL HIGH (ref 70–99)

## 2014-03-28 LAB — SURGICAL PCR SCREEN
MRSA, PCR: NEGATIVE
Staphylococcus aureus: NEGATIVE

## 2014-03-28 LAB — LIPASE, BLOOD: Lipase: 27 U/L (ref 11–59)

## 2014-03-28 SURGERY — LAPAROSCOPIC CHOLECYSTECTOMY WITH INTRAOPERATIVE CHOLANGIOGRAM
Anesthesia: General | Site: Abdomen

## 2014-03-28 MED ORDER — INSULIN ASPART 100 UNIT/ML ~~LOC~~ SOLN: 0.0000 [IU] | Freq: Three times a day (TID) | SUBCUTANEOUS | Status: DC

## 2014-03-28 MED ORDER — MIDAZOLAM HCL 2 MG/2ML IJ SOLN
INTRAMUSCULAR | Status: AC
  Filled 2014-03-28: qty 2

## 2014-03-28 MED ORDER — ONDANSETRON HCL 4 MG/2ML IJ SOLN: 4.0000 mg | Freq: Four times a day (QID) | INTRAMUSCULAR | Status: DC | PRN

## 2014-03-28 MED ORDER — PIPERACILLIN-TAZOBACTAM 3.375 G IVPB 30 MIN
3.3750 g | Freq: Three times a day (TID) | INTRAVENOUS | Status: DC
  Administered 2014-03-28 – 2014-03-31 (×8): 3.375 g via INTRAVENOUS
  Filled 2014-03-28 (×10): qty 50

## 2014-03-28 MED ORDER — FENTANYL CITRATE 0.05 MG/ML IJ SOLN
INTRAMUSCULAR | Status: AC
  Administered 2014-03-28: 100 ug
  Filled 2014-03-28: qty 2

## 2014-03-28 MED ORDER — SODIUM CHLORIDE 0.9 % IV SOLN: INTRAVENOUS | Status: DC

## 2014-03-28 MED ORDER — LACTATED RINGERS IV SOLN
INTRAVENOUS | Status: DC | PRN
  Administered 2014-03-28: 1000 mL

## 2014-03-28 MED ORDER — FENTANYL CITRATE 0.05 MG/ML IJ SOLN
INTRAMUSCULAR | Status: AC
  Filled 2014-03-28: qty 5

## 2014-03-28 MED ORDER — HYDROMORPHONE HCL PF 1 MG/ML IJ SOLN
0.5000 mg | Freq: Once | INTRAMUSCULAR | Status: DC
  Filled 2014-03-28: qty 1

## 2014-03-28 MED ORDER — ROCURONIUM BROMIDE 100 MG/10ML IV SOLN
INTRAVENOUS | Status: AC
  Filled 2014-03-28: qty 1

## 2014-03-28 MED ORDER — LACTATED RINGERS IV SOLN: INTRAVENOUS | Status: DC

## 2014-03-28 MED ORDER — PIPERACILLIN-TAZOBACTAM 3.375 G IVPB 30 MIN
3.3750 g | Freq: Three times a day (TID) | INTRAVENOUS | Status: DC
  Filled 2014-03-28: qty 50

## 2014-03-28 MED ORDER — PROPOFOL 10 MG/ML IV BOLUS
INTRAVENOUS | Status: DC | PRN
  Administered 2014-03-28: 130 mg via INTRAVENOUS

## 2014-03-28 MED ORDER — FENTANYL CITRATE 0.05 MG/ML IJ SOLN
50.0000 ug | Freq: Once | INTRAMUSCULAR | Status: AC
  Administered 2014-03-28: 50 ug via INTRAVENOUS
  Filled 2014-03-28: qty 2

## 2014-03-28 MED ORDER — LIDOCAINE HCL (PF) 2 % IJ SOLN
INTRAMUSCULAR | Status: DC | PRN
  Administered 2014-03-28: 100 mg via INTRADERMAL

## 2014-03-28 MED ORDER — BUPIVACAINE-EPINEPHRINE 0.25% -1:200000 IJ SOLN
INTRAMUSCULAR | Status: DC | PRN
  Administered 2014-03-28: 15 mL

## 2014-03-28 MED ORDER — LACTATED RINGERS IV SOLN
INTRAVENOUS | Status: DC | PRN
  Administered 2014-03-28: 14:00:00 via INTRAVENOUS

## 2014-03-28 MED ORDER — PIPERACILLIN-TAZOBACTAM 3.375 G IVPB 30 MIN
3.3750 g | Freq: Once | INTRAVENOUS | Status: AC
  Administered 2014-03-28: 3.375 g via INTRAVENOUS
  Filled 2014-03-28 (×2): qty 50

## 2014-03-28 MED ORDER — ONDANSETRON HCL 4 MG/2ML IJ SOLN
4.0000 mg | Freq: Once | INTRAMUSCULAR | Status: AC
  Administered 2014-03-28: 4 mg via INTRAVENOUS
  Filled 2014-03-28: qty 2

## 2014-03-28 MED ORDER — SUCCINYLCHOLINE CHLORIDE 20 MG/ML IJ SOLN
INTRAMUSCULAR | Status: DC | PRN
  Administered 2014-03-28: 100 mg via INTRAVENOUS

## 2014-03-28 MED ORDER — TRAMADOL HCL 50 MG PO TABS
100.0000 mg | ORAL_TABLET | Freq: Two times a day (BID) | ORAL | Status: DC | PRN
  Administered 2014-03-29 – 2014-03-31 (×5): 100 mg via ORAL
  Filled 2014-03-28 (×6): qty 2

## 2014-03-28 MED ORDER — LIDOCAINE HCL (CARDIAC) 20 MG/ML IV SOLN
INTRAVENOUS | Status: AC
  Filled 2014-03-28: qty 5

## 2014-03-28 MED ORDER — ROCURONIUM BROMIDE 100 MG/10ML IV SOLN
INTRAVENOUS | Status: DC | PRN
  Administered 2014-03-28: 10 mg via INTRAVENOUS
  Administered 2014-03-28: 20 mg via INTRAVENOUS

## 2014-03-28 MED ORDER — ACETAMINOPHEN 650 MG RE SUPP: 650.0000 mg | Freq: Four times a day (QID) | RECTAL | Status: DC | PRN

## 2014-03-28 MED ORDER — POTASSIUM CHLORIDE IN NACL 20-0.9 MEQ/L-% IV SOLN
INTRAVENOUS | Status: DC
  Filled 2014-03-28 (×3): qty 1000

## 2014-03-28 MED ORDER — DIPHENHYDRAMINE HCL 50 MG/ML IJ SOLN: 12.5000 mg | Freq: Four times a day (QID) | INTRAMUSCULAR | Status: DC | PRN

## 2014-03-28 MED ORDER — FENTANYL CITRATE 0.05 MG/ML IJ SOLN
12.5000 ug | INTRAMUSCULAR | Status: DC | PRN
  Administered 2014-03-28: 12.5 ug via INTRAVENOUS
  Administered 2014-03-28 – 2014-03-30 (×6): 25 ug via INTRAVENOUS
  Filled 2014-03-28 (×9): qty 2

## 2014-03-28 MED ORDER — MEPERIDINE HCL 50 MG/ML IJ SOLN: 6.2500 mg | INTRAMUSCULAR | Status: DC | PRN

## 2014-03-28 MED ORDER — IOHEXOL 300 MG/ML  SOLN
INTRAMUSCULAR | Status: DC | PRN
  Administered 2014-03-28: 7 mL

## 2014-03-28 MED ORDER — PHENYLEPHRINE HCL 10 MG/ML IJ SOLN
INTRAMUSCULAR | Status: DC | PRN
  Administered 2014-03-28: 40 ug via INTRAVENOUS
  Administered 2014-03-28: 80 ug via INTRAVENOUS
  Administered 2014-03-28: 40 ug via INTRAVENOUS
  Administered 2014-03-28: 80 ug via INTRAVENOUS
  Administered 2014-03-28: 40 ug via INTRAVENOUS

## 2014-03-28 MED ORDER — FENTANYL CITRATE 0.05 MG/ML IJ SOLN
100.0000 ug | Freq: Once | INTRAMUSCULAR | Status: AC
  Administered 2014-03-28: 100 ug via INTRAVENOUS

## 2014-03-28 MED ORDER — PIPERACILLIN-TAZOBACTAM 3.375 G IVPB
INTRAVENOUS | Status: AC
  Filled 2014-03-28: qty 50

## 2014-03-28 MED ORDER — PROMETHAZINE HCL 25 MG/ML IJ SOLN: 6.2500 mg | INTRAMUSCULAR | Status: DC | PRN

## 2014-03-28 MED ORDER — POTASSIUM CHLORIDE IN NACL 20-0.9 MEQ/L-% IV SOLN
INTRAVENOUS | Status: DC
  Administered 2014-03-28: 75 mL via INTRAVENOUS
  Administered 2014-03-29: 16:00:00 via INTRAVENOUS
  Administered 2014-03-30: 75 mL via INTRAVENOUS
  Administered 2014-03-30: 20:00:00 via INTRAVENOUS
  Filled 2014-03-28 (×8): qty 1000

## 2014-03-28 MED ORDER — FENTANYL CITRATE 0.05 MG/ML IJ SOLN
INTRAMUSCULAR | Status: DC | PRN
  Administered 2014-03-28: 100 ug via INTRAVENOUS
  Administered 2014-03-28 (×3): 50 ug via INTRAVENOUS

## 2014-03-28 MED ORDER — PROPOFOL 10 MG/ML IV BOLUS
INTRAVENOUS | Status: AC
  Filled 2014-03-28: qty 20

## 2014-03-28 MED ORDER — ACETAMINOPHEN 325 MG PO TABS: 650.0000 mg | ORAL_TABLET | Freq: Four times a day (QID) | ORAL | Status: DC | PRN

## 2014-03-28 MED ORDER — FENTANYL CITRATE 0.05 MG/ML IJ SOLN
25.0000 ug | INTRAMUSCULAR | Status: DC | PRN
  Administered 2014-03-28 – 2014-03-29 (×2): 25 ug via INTRAVENOUS
  Filled 2014-03-28 (×2): qty 2

## 2014-03-28 MED ORDER — GLYCOPYRROLATE 0.2 MG/ML IJ SOLN
INTRAMUSCULAR | Status: DC | PRN
  Administered 2014-03-28: .4 mg via INTRAVENOUS

## 2014-03-28 MED ORDER — MIDAZOLAM HCL 5 MG/5ML IJ SOLN
INTRAMUSCULAR | Status: DC | PRN
  Administered 2014-03-28: 2 mg via INTRAVENOUS

## 2014-03-28 MED ORDER — DIPHENHYDRAMINE HCL 12.5 MG/5ML PO ELIX: 12.5000 mg | ORAL_SOLUTION | Freq: Four times a day (QID) | ORAL | Status: DC | PRN

## 2014-03-28 MED ORDER — SODIUM CHLORIDE 0.9 % IR SOLN
Status: DC | PRN
  Administered 2014-03-28: 1000 mL

## 2014-03-28 MED ORDER — NEOSTIGMINE METHYLSULFATE 10 MG/10ML IV SOLN
INTRAVENOUS | Status: DC | PRN
  Administered 2014-03-28: 3 mg via INTRAVENOUS

## 2014-03-28 MED ORDER — FENTANYL CITRATE 0.05 MG/ML IJ SOLN
INTRAMUSCULAR | Status: AC
  Filled 2014-03-28: qty 2

## 2014-03-28 MED ORDER — SODIUM CHLORIDE 0.9 % IV BOLUS (SEPSIS)
1000.0000 mL | Freq: Once | INTRAVENOUS | Status: AC
  Administered 2014-03-28: 1000 mL via INTRAVENOUS

## 2014-03-28 MED ORDER — ONDANSETRON HCL 4 MG/2ML IJ SOLN
INTRAMUSCULAR | Status: AC
  Filled 2014-03-28: qty 2

## 2014-03-28 MED ORDER — ONDANSETRON HCL 4 MG/2ML IJ SOLN
INTRAMUSCULAR | Status: DC | PRN
  Administered 2014-03-28: 4 mg via INTRAVENOUS

## 2014-03-28 MED ORDER — PIPERACILLIN-TAZOBACTAM 3.375 G IVPB 30 MIN
3.3750 g | Freq: Once | INTRAVENOUS | Status: AC
  Administered 2014-03-28: 3.375 g via INTRAVENOUS
  Filled 2014-03-28: qty 50

## 2014-03-28 MED ORDER — BUPIVACAINE-EPINEPHRINE (PF) 0.25% -1:200000 IJ SOLN
INTRAMUSCULAR | Status: AC
  Filled 2014-03-28: qty 30

## 2014-03-28 SURGICAL SUPPLY — 39 items
ADH SKN CLS APL DERMABOND .7 (GAUZE/BANDAGES/DRESSINGS) ×1
APPLIER CLIP ROT 10 11.4 M/L (STAPLE) ×3
APR CLP MED LRG 11.4X10 (STAPLE) ×1
BAG SPEC RTRVL LRG 6X4 10 (ENDOMECHANICALS)
CANISTER SUCTION 2500CC (MISCELLANEOUS) ×3 IMPLANT
CATH REDDICK CHOLANGI 4FR 50CM (CATHETERS) IMPLANT
CHLORAPREP W/TINT 26ML (MISCELLANEOUS) ×3 IMPLANT
CLIP APPLIE ROT 10 11.4 M/L (STAPLE) ×1 IMPLANT
COVER MAYO STAND STRL (DRAPES) ×3 IMPLANT
DECANTER SPIKE VIAL GLASS SM (MISCELLANEOUS) ×1 IMPLANT
DERMABOND ADVANCED (GAUZE/BANDAGES/DRESSINGS) ×2
DERMABOND ADVANCED .7 DNX12 (GAUZE/BANDAGES/DRESSINGS) ×1 IMPLANT
DRAPE C-ARM 42X120 X-RAY (DRAPES) ×3 IMPLANT
DRAPE LAPAROSCOPIC ABDOMINAL (DRAPES) ×3 IMPLANT
DRAPE UTILITY XL STRL (DRAPES) ×3 IMPLANT
ELECT REM PT RETURN 9FT ADLT (ELECTROSURGICAL) ×3
ELECTRODE REM PT RTRN 9FT ADLT (ELECTROSURGICAL) ×1 IMPLANT
GLOVE BIOGEL PI IND STRL 7.5 (GLOVE) ×1 IMPLANT
GLOVE BIOGEL PI INDICATOR 7.5 (GLOVE) ×2
GLOVE SS BIOGEL STRL SZ 7.5 (GLOVE) ×1 IMPLANT
GLOVE SUPERSENSE BIOGEL SZ 7.5 (GLOVE) ×2
GOWN STRL REUS W/TWL XL LVL3 (GOWN DISPOSABLE) ×6 IMPLANT
HEMOSTAT SNOW SURGICEL 2X4 (HEMOSTASIS) ×2 IMPLANT
KIT BASIN OR (CUSTOM PROCEDURE TRAY) ×3 IMPLANT
NS IRRIG 1000ML POUR BTL (IV SOLUTION) IMPLANT
POUCH SPECIMEN RETRIEVAL 10MM (ENDOMECHANICALS) IMPLANT
SCISSORS LAP 5X35 DISP (ENDOMECHANICALS) ×3 IMPLANT
SET CHOLANGIOGRAPH MIX (MISCELLANEOUS) ×3 IMPLANT
SET IRRIG TUBING LAPAROSCOPIC (IRRIGATION / IRRIGATOR) ×3 IMPLANT
SLEEVE XCEL OPT CAN 5 100 (ENDOMECHANICALS) ×3 IMPLANT
SOLUTION ANTI FOG 6CC (MISCELLANEOUS) ×3 IMPLANT
SUT MNCRL AB 4-0 PS2 18 (SUTURE) ×3 IMPLANT
TOWEL OR 17X26 10 PK STRL BLUE (TOWEL DISPOSABLE) ×3 IMPLANT
TOWEL OR NON WOVEN STRL DISP B (DISPOSABLE) ×3 IMPLANT
TRAY LAP CHOLE (CUSTOM PROCEDURE TRAY) ×3 IMPLANT
TROCAR BLADELESS OPT 5 100 (ENDOMECHANICALS) ×3 IMPLANT
TROCAR XCEL BLUNT TIP 100MML (ENDOMECHANICALS) ×3 IMPLANT
TROCAR XCEL NON-BLD 11X100MML (ENDOMECHANICALS) ×3 IMPLANT
TUBING INSUFFLATION 10FT LAP (TUBING) ×3 IMPLANT

## 2014-03-28 NOTE — Op Note (Signed)
Preoperative Diagnosis: cholelithiasis and acute cholecystitis  Postoprative Diagnosis: cholelithiasis and acute gangrenous cholecystitis  Procedure: Procedure(s): LAPAROSCOPIC CHOLECYSTECTOMY WITH INTRAOPERATIVE CHOLANGIOGRAM   Surgeon: Excell Seltzer T   Assistants: none  Anesthesia:  General endotracheal anesthesia  Indications: patient is a 73 year old male who presents with a three-day history of acute severe and worsening right upper quadrant abdominal pain. Gallbladder ultrasound has shown multiple gallstones. He is suspected of having acute cholecystitis and I recommended proceeding with laparoscopic cholecystectomy with cholangiogram. We discussed the indications and nature of the surgery and risks detailed extensively elsewhere. He is brought to the operating room for this procedure.  Procedure Detail:  Patient was brought to the operating room, placed in the supine position on the operating table, and general endotracheal anesthesia induced. The abdomen was widely sterilely prepped and draped. He was on broad-spectrum preoperative IV antibiotics. PAS were placed. Patient timeout was performed and the procedure verified. Access was obtained 1/2 cm open Hassan technique incision at the umbilicus for about suture of 0 Vicryl and pneumoperitoneum established. Standard 4 port technique was used. The gallbladder was exposed and was tensely distended and gangrenous. The gallbladder was aspirated and the fundus grasped and elevated up over the liver. There were no adhesions to the gallbladder. The infundibulum was very edematous but less severely inflamed. The infundibulum was grasped and retracted inferolaterally. The tissue was very edematous. Peritoneum anterior and posterior to close triangle was incised and fibrofatty tissue stripped off the neck of the gallbladder toward the porta hepatus. Close triangle was thoroughly dissected and the distal gallbladder was dissected away from the  cholecystic plate. The cystic duct and cystic artery were skeletonized and a good critical view obtained. When the anatomy was clear the cystic artery was doubly clipped proximally clipped distally and divided. The cystic duct was clipped at the gallbladder junction and an operative cholangiogram obtained through the cystic duct. This showed good filling of a normal common bile duct and intrahepatic ducts with free flow to the duodenum and no filling defects. A low insertion of the cystic duct was noted incidentally. The clench cath was removed and the cystic duct triply clipped proximally and divided. The gallbladder was then dissected free using hook cautery and placed in an Endo Catch bag. The gallbladder bed was cauterized and a Surgicel pack placed and complete hemostasis obtained. The gallbladder was removed through the umbilicus. The abdomen was irrigated. There was no evidence of bleeding or trocar injury. The mattress which was secured the umbilicus all CO2 evacuated. Skin incisions were closed with subcuticular Monocryl and Dermabond. Sponge needle and instrument counts were correct.    Findings: Acute gangrenous cholecystitis  Estimated Blood Loss:  Minimal         Drains: none  Blood Given: none          Specimens: gallbladder and contents        Complications:  * No complications entered in OR log *         Disposition: PACU - hemodynamically stable.         Condition: stable

## 2014-03-28 NOTE — Anesthesia Postprocedure Evaluation (Signed)
  Anesthesia Post-op Note  Patient: Bryan Turner  Procedure(s) Performed: Procedure(s) (LRB): LAPAROSCOPIC CHOLECYSTECTOMY WITH INTRAOPERATIVE CHOLANGIOGRAM (N/A)  Patient Location: PACU  Anesthesia Type: General  Level of Consciousness: awake and alert   Airway and Oxygen Therapy: Patient Spontanous Breathing  Post-op Pain: mild  Post-op Assessment: Post-op Vital signs reviewed, Patient's Cardiovascular Status Stable, Respiratory Function Stable, Patent Airway and No signs of Nausea or vomiting  Last Vitals:  Filed Vitals:   03/28/14 1748  BP: 167/82  Pulse: 60  Temp: 36.2 C  Resp: 20    Post-op Vital Signs: stable   Complications: No apparent anesthesia complications

## 2014-03-28 NOTE — ED Notes (Signed)
MD at bedside. 

## 2014-03-28 NOTE — ED Notes (Signed)
[facility] at bedside preparing for transport 

## 2014-03-28 NOTE — Transfer of Care (Signed)
Immediate Anesthesia Transfer of Care Note  Patient: Bryan Turner  Procedure(s) Performed: Procedure(s) (LRB): LAPAROSCOPIC CHOLECYSTECTOMY WITH INTRAOPERATIVE CHOLANGIOGRAM (N/A)  Patient Location: PACU  Anesthesia Type: General  Level of Consciousness: sedated, patient cooperative and responds to stimulation  Airway & Oxygen Therapy: Patient Spontanous Breathing and Patient connected to face mask oxgen  Post-op Assessment: Report given to PACU RN and Post -op Vital signs reviewed and stable  Post vital signs: Reviewed and stable  Complications: No apparent anesthesia complications

## 2014-03-28 NOTE — Anesthesia Preprocedure Evaluation (Signed)
Anesthesia Evaluation  Patient identified by MRN, date of birth, ID band Patient awake    Reviewed: Allergy & Precautions, H&P , NPO status , Patient's Chart, lab work & pertinent test results  Airway Mallampati: II TM Distance: >3 FB Neck ROM: Full    Dental no notable dental hx.    Pulmonary asthma , former smoker,  breath sounds clear to auscultation  Pulmonary exam normal       Cardiovascular negative cardio ROS  Rhythm:Regular Rate:Normal     Neuro/Psych negative neurological ROS  negative psych ROS   GI/Hepatic negative GI ROS, Neg liver ROS,   Endo/Other  diabetes, Type 2, Oral Hypoglycemic Agents  Renal/GU negative Renal ROS  negative genitourinary   Musculoskeletal negative musculoskeletal ROS (+)   Abdominal   Peds negative pediatric ROS (+)  Hematology negative hematology ROS (+)   Anesthesia Other Findings Upper front teeth bridged  Reproductive/Obstetrics negative OB ROS                           Anesthesia Physical Anesthesia Plan  ASA: II  Anesthesia Plan: General   Post-op Pain Management:    Induction: Intravenous  Airway Management Planned: Oral ETT  Additional Equipment:   Intra-op Plan:   Post-operative Plan: Extubation in OR  Informed Consent: I have reviewed the patients History and Physical, chart, labs and discussed the procedure including the risks, benefits and alternatives for the proposed anesthesia with the patient or authorized representative who has indicated his/her understanding and acceptance.   Dental advisory given  Plan Discussed with: CRNA  Anesthesia Plan Comments:         Anesthesia Quick Evaluation

## 2014-03-28 NOTE — ED Provider Notes (Signed)
CSN: [id number]     Arrival date & time 03/28/14  3299 History   First MD Initiated Contact with Patient 03/28/14 0818     Chief Complaint  Patient presents with  . Flank Pain     (Consider location/radiation/quality/duration/timing/severity/associated sxs/prior Treatment) HPI 73 year old male with 3 days of right upper quadrant abdominal pain. It was constant 2 nights ago but yesterday seemed to be getting better.This morning it is back to severe. He describes as a 9/10 sharp pain. He's had nausea without any vomiting. He's been having some right low back pain as well. No hematuria or dysuria. He tried one tramadol with some success. No diarrhea or constipation. Had a normal bowel movement this morning. Has had no appetite during this time. No prior history of abdominal issues or surgery.  Past Medical History  Diagnosis Date  . Asthma   . Macular degeneration   . Diabetes mellitus without complication    Past Surgical History  Procedure Laterality Date  . Eye surgery    . Hemorrhoid surgery     No family history on file. History  Substance Use Topics  . Smoking status: Former Smoker    Quit date: 06/16/1992  . Smokeless tobacco: Never Used  . Alcohol Use: No    Review of Systems  Constitutional: Negative for fever.  Respiratory: Negative for cough.   Cardiovascular: Negative for chest pain.  Gastrointestinal: Positive for nausea and abdominal pain. Negative for vomiting, diarrhea, constipation and blood in stool.  Genitourinary: Negative for dysuria.  Musculoskeletal: Positive for back pain.  All other systems reviewed and are negative.     Allergies  Aspirin; Codeine; and Prednisone  Home Medications   Prior to Admission medications   Medication Sig Start Date End Date Taking? Authorizing Provider  metFORMIN (GLUCOPHAGE) 1000 MG tablet Take 1,000 mg by mouth 2 (two) times daily with a meal.   Yes Historical Provider, MD  albuterol (PROVENTIL HFA;VENTOLIN HFA)  108 (90 BASE) MCG/ACT inhaler Inhale 2 puffs into the lungs every 6 (six) hours as needed.    Historical Provider, MD  traMADol (ULTRAM) 50 MG tablet Take 1 tablet (50 mg total) by mouth every 6 (six) hours as needed. 11/24/13   Gratton, NP  ZOSTAVAX 24268 UNT/0.65ML injection  09/10/13   Historical Provider, MD   BP 120/69  Pulse 62  Temp(Src) 97.6 F (36.4 C) (Oral)  Resp 18  Ht 6' (1.829 m)  Wt 204 lb (92.534 kg)  BMI 27.66 kg/m2  SpO2 99% Physical Exam  Nursing note and vitals reviewed. Constitutional: He is oriented to person, place, and time. He appears well-developed and well-nourished.  HENT:  Head: Normocephalic and atraumatic.  Right Ear: External ear normal.  Left Ear: External ear normal.  Nose: Nose normal.  Eyes: Right eye exhibits no discharge. Left eye exhibits no discharge.  Neck: Neck supple.  Cardiovascular: Normal rate, regular rhythm, normal heart sounds and intact distal pulses.   Pulmonary/Chest: Effort normal.  Abdominal: Soft. There is tenderness in the right upper quadrant. There is no CVA tenderness and negative Murphy's sign.  Musculoskeletal: He exhibits no edema.  Neurological: He is alert and oriented to person, place, and time.  Skin: Skin is warm and dry. There is pallor.    ED Course  Procedures (including critical care time) Labs Review Labs Reviewed  URINALYSIS, ROUTINE W REFLEX MICROSCOPIC - Abnormal; Notable for the following:    Color, Urine AMBER (*)    Bilirubin Urine SMALL (*)  Ketones, ur 15 (*)    All other components within normal limits  CBC WITH DIFFERENTIAL - Abnormal; Notable for the following:    WBC 12.0 (*)    RBC 4.21 (*)    Monocytes Relative 14 (*)    Monocytes Absolute 1.7 (*)    All other components within normal limits  COMPREHENSIVE METABOLIC PANEL - Abnormal; Notable for the following:    Glucose, Bld 185 (*)    GFR calc non Af Amer 64 (*)    GFR calc Af Amer 74 (*)    All other components within  normal limits  LIPASE, BLOOD    Imaging Review US Abdomen Complete  03/28/2014   CLINICAL DATA:  Intermittent right upper quadrant pain extending to upper back. Diabetes.  EXAM: ULTRASOUND ABDOMEN COMPLETE  COMPARISON:  Report from 05/15/1999 ultrasound. Images not available.  FINDINGS: Gallbladder:  Multiple mobile bile gallstones. Mild gallbladder wall thickening. No pericholecystic fluid. Patient was not tender over this region during scanning per ultrasound technologist.  Common bile duct:  Diameter: 6.6 mm. Distal aspect not visualized secondary to bowel gas.  Liver:  No focal lesion identified. Within normal limits in parenchymal echogenicity.  IVC:  Poorly delineated secondary to bowel gas.  Pancreas:  Poorly delineated secondary to bowel gas.  Spleen:  Size and appearance within normal limits.  Right Kidney:  Length: 12.0 cm. Mild renal parenchymal thinning. No hydronephrosis or obvious mass. Evaluation slightly limited by bowel gas.  Left Kidney:  Length: 10.6 cm. Mild renal parenchymal thinning. No hydronephrosis or obvious mass. Evaluation slightly limited by bowel gas.  Abdominal aorta:  Evaluation limited by bowel gas. Atherosclerotic type changes noted. Maximal transverse dimension obtained of 2.9 cm.  Other findings:  None.  IMPRESSION: Multiple mobile bile gallstones. Mild gallbladder wall thickening. No pericholecystic fluid. Patient was not tender over this region during scanning per ultrasound technologist.  Mild bilateral renal parenchymal thinning without hydronephrosis.  Secondary to bowel gas, limited evaluation of distal common bile duct, inferior vena cava, pancreas, portions of the kidneys and abdominal aorta.  Atherosclerotic type changes of the aorta suspected with maximal transverse dimension 2.9 cm   Electronically Signed   By: Chauncey Cruel M.D.   On: 03/28/2014 09:39     EKG Interpretation None      MDM   Final diagnoses:  Cholecystitis    Patient's history and exam  are concerning for early cholecystitis. He has poor pain control with multiple doses of fentanyl. Due to this a consult to Dr. Excell Seltzer, who accepts the patient is a transfer and admission to [facility]. Will give the patient Zosyn, keep n.p.o., give fluids, and pain control. Is stable for transfer at this time.    Ephraim Hamburger, MD 03/28/14 1051

## 2014-03-28 NOTE — ED Notes (Signed)
O2 applied

## 2014-03-28 NOTE — H&P (Signed)
Patient interviewed and examined, agree with PA note above. Plan to proceed with cholecystectomy.  I discussed the procedure in detail.   We discussed the risks and benefits of a laparoscopic cholecystectomy and possible cholangiogram including, but not limited to bleeding, infection, injury to surrounding structures such as the intestine or liver, bile leak, retained gallstones, need to convert to an open procedure, prolonged diarrhea, blood clots such as  DVT, common bile duct injury, anesthesia risks, and possible need for additional procedures.  The likelihood of improvement in symptoms and return to the patient's normal status is good. We discussed the typical post-operative recovery course. [name] MD, FACS  03/28/2014 3:09 PM

## 2014-03-28 NOTE — ED Notes (Signed)
RUQ pain that started Tuesday and radiates to right flank.  Pain is intermittent and described as moderate to severe.

## 2014-03-28 NOTE — ED Notes (Signed)
Pt and family informed of transfer, admission, and plan of care.

## 2014-03-28 NOTE — ED Notes (Signed)
States pain decreased initially but returned and is described as severe.

## 2014-03-28 NOTE — H&P (Signed)
Bryan Turner is an 73 y.o. male.    Melinda Crutch, MD:  PCP  Chief Complaint: Abdominal pain HPI: 72 year old male with 3 days of right upper quadrant abdominal pain. It was constant 2 nights ago but yesterday seemed to be getting better.This morning it is back to severe. He describes as a 9/10 sharp pain. He's had nausea without any vomiting. He's been having some right low back pain as well. No hematuria or dysuria. He tried one tramadol with some success. No diarrhea or constipation. Had a normal bowel movement this morning. Has had no appetite during this time. No prior history of abdominal issues or surgery.  HE was seen in the ED at [facility].  Work up shows elevated WBC, normal lipase and LFT's glucose is up some.  Abd US shows:  Multiple mobile bile gallstones. Mild gallbladder wall thickening. No pericholecystic fluid. Patient was not tender over this regionduring scanning per ultrasound technologist. Common bile duct: Diameter: 6.6 mm. Distal aspect not visualized secondary to bowel gas. She was transferred to [facility] and as a direct admission for possible surgery today.    Past Medical History  Diagnosis Date  . Asthma   . Macular degeneration   . Diabetes mellitus without complication     Past Surgical History  Procedure Laterality Date  . Eye surgery    . Hemorrhoid surgery      No family history on file. Social History:  reports that he quit smoking about 21 years ago. He has never used smokeless tobacco. He reports that he does not drink alcohol or use illicit drugs.  Allergies:  Allergies  Allergen Reactions  . Aspirin     Bleeds easily  . Codeine Swelling    Throat swelling  . Flagyl [Metronidazole] Other (See Comments)    Stomach issues  . Prednisone   . Tamiflu [Oseltamivir] Other (See Comments)    Hallucinations    Medications Prior to Admission  Medication Sig Dispense Refill  . Fish Oil-Cholecalciferol (FISH OIL + D3 PO) Take 1 capsule by mouth daily.       . Lutein-Zeaxanthin 15-0.7 MG CAPS Take 1 capsule by mouth daily.      . metFORMIN (GLUCOPHAGE) 500 MG tablet Take 500 mg by mouth 2 (two) times daily with a meal.      . multivitamin (ONE-A-DAY MEN'S) TABS tablet Take 1 tablet by mouth daily.      . saw palmetto 80 MG capsule Take 80 mg by mouth daily.      . traMADol (ULTRAM) 50 MG tablet Take 50 mg by mouth every 6 (six) hours as needed (pain).      . traMADol (ULTRAM) 50 MG tablet Take 1 tablet (50 mg total) by mouth every 6 (six) hours as needed.  15 tablet  0  . ZOSTAVAX 64332 UNT/0.65ML injection         Results for orders placed during the hospital encounter of 03/28/14 (from the past 48 hour(s))  URINALYSIS, ROUTINE W REFLEX MICROSCOPIC     Status: Abnormal   Collection Time    03/28/14  8:34 AM      Result Value Ref Range   Color, Urine AMBER (*) YELLOW   Comment: BIOCHEMICALS MAY BE AFFECTED BY COLOR   APPearance CLEAR  CLEAR   Specific Gravity, Urine 1.027  1.005 - 1.030   pH 5.0  5.0 - 8.0   Glucose, UA NEGATIVE  NEGATIVE mg/dL   Hgb urine dipstick NEGATIVE  NEGATIVE  Bilirubin Urine SMALL (*) NEGATIVE   Ketones, ur 15 (*) NEGATIVE mg/dL   Protein, ur NEGATIVE  NEGATIVE mg/dL   Urobilinogen, UA 0.2  0.0 - 1.0 mg/dL   Nitrite NEGATIVE  NEGATIVE   Leukocytes, UA NEGATIVE  NEGATIVE   Comment: MICROSCOPIC NOT DONE ON URINES WITH NEGATIVE PROTEIN, BLOOD, LEUKOCYTES, NITRITE, OR GLUCOSE <1000 mg/dL.  CBC WITH DIFFERENTIAL     Status: Abnormal   Collection Time    03/28/14  8:45 AM      Result Value Ref Range   WBC 12.0 (*) 4.0 - 10.5 K/uL   RBC 4.21 (*) 4.22 - 5.81 MIL/uL   Hemoglobin 13.4  13.0 - 17.0 g/dL   HCT 39.6  39.0 - 52.0 %   MCV 94.1  78.0 - 100.0 fL   MCH 31.8  26.0 - 34.0 pg   MCHC 33.8  30.0 - 36.0 g/dL   RDW 13.5  11.5 - 15.5 %   Platelets 189  150 - 400 K/uL   Neutrophils Relative % 64  43 - 77 %   Neutro Abs 7.6  1.7 - 7.7 K/uL   Lymphocytes Relative 21  12 - 46 %   Lymphs Abs 2.5  0.7 - 4.0  K/uL   Monocytes Relative 14 (*) 3 - 12 %   Monocytes Absolute 1.7 (*) 0.1 - 1.0 K/uL   Eosinophils Relative 1  0 - 5 %   Eosinophils Absolute 0.1  0.0 - 0.7 K/uL   Basophils Relative 0  0 - 1 %   Basophils Absolute 0.0  0.0 - 0.1 K/uL  COMPREHENSIVE METABOLIC PANEL     Status: Abnormal   Collection Time    03/28/14  8:45 AM      Result Value Ref Range   Sodium 143  137 - 147 mEq/L   Potassium 4.5  3.7 - 5.3 mEq/L   Chloride 105  96 - 112 mEq/L   CO2 24  19 - 32 mEq/L   Glucose, Bld 185 (*) 70 - 99 mg/dL   BUN 18  6 - 23 mg/dL   Creatinine, Ser 1.10  0.50 - 1.35 mg/dL   Calcium 9.2  8.4 - 10.5 mg/dL   Total Protein 6.9  6.0 - 8.3 g/dL   Albumin 3.8  3.5 - 5.2 g/dL   AST 21  0 - 37 U/L   ALT 19  0 - 53 U/L   Alkaline Phosphatase 41  39 - 117 U/L   Total Bilirubin 0.9  0.3 - 1.2 mg/dL   GFR calc non Af Amer 64 (*) >90 mL/min   GFR calc Af Amer 74 (*) >90 mL/min   Comment: (NOTE)     The eGFR has been calculated using the CKD EPI equation.     This calculation has not been validated in all clinical situations.     eGFR's persistently <90 mL/min signify possible Chronic Kidney     Disease.  LIPASE, BLOOD     Status: None   Collection Time    03/28/14  8:45 AM      Result Value Ref Range   Lipase 27  11 - 59 U/L   US Abdomen Complete  03/28/2014   CLINICAL DATA:  Intermittent right upper quadrant pain extending to upper back. Diabetes.  EXAM: ULTRASOUND ABDOMEN COMPLETE  COMPARISON:  Report from 05/15/1999 ultrasound. Images not available.  FINDINGS: Gallbladder:  Multiple mobile bile gallstones. Mild gallbladder wall thickening. No pericholecystic fluid. Patient was not tender  over this region during scanning per ultrasound technologist.  Common bile duct:  Diameter: 6.6 mm. Distal aspect not visualized secondary to bowel gas.  Liver:  No focal lesion identified. Within normal limits in parenchymal echogenicity.  IVC:  Poorly delineated secondary to bowel gas.  Pancreas:  Poorly  delineated secondary to bowel gas.  Spleen:  Size and appearance within normal limits.  Right Kidney:  Length: 12.0 cm. Mild renal parenchymal thinning. No hydronephrosis or obvious mass. Evaluation slightly limited by bowel gas.  Left Kidney:  Length: 10.6 cm. Mild renal parenchymal thinning. No hydronephrosis or obvious mass. Evaluation slightly limited by bowel gas.  Abdominal aorta:  Evaluation limited by bowel gas. Atherosclerotic type changes noted. Maximal transverse dimension obtained of 2.9 cm.  Other findings:  None.  IMPRESSION: Multiple mobile bile gallstones. Mild gallbladder wall thickening. No pericholecystic fluid. Patient was not tender over this region during scanning per ultrasound technologist.  Mild bilateral renal parenchymal thinning without hydronephrosis.  Secondary to bowel gas, limited evaluation of distal common bile duct, inferior vena cava, pancreas, portions of the kidneys and abdominal aorta.  Atherosclerotic type changes of the aorta suspected with maximal transverse dimension 2.9 cm   Electronically Signed   By: Chauncey Cruel M.D.   On: 03/28/2014 09:39    Review of Systems  Constitutional: Negative.        He has lost 110 lbs over the years with diet.  HENT:       He is very hard of hearing with bilateral hearing aids   Eyes:       He has macular degeneration but vision is clear currently.  Respiratory: Positive for cough. Negative for hemoptysis, sputum production, shortness of breath and wheezing.        It sounds like him and his wife had a mild URI over the last month.  No sx currently.  Cardiovascular: Negative.   Gastrointestinal: Positive for heartburn, nausea and abdominal pain (RUQ going to back). Negative for vomiting, diarrhea, constipation, blood in stool and melena.  Genitourinary: Negative.   Musculoskeletal:       He has a heel spur and this gives him some trouble walking for a distance.  Skin: Negative.   Neurological: Negative.    Endo/Heme/Allergies: Negative for environmental allergies and polydipsia. Does not bruise/bleed easily.  Psychiatric/Behavioral: Negative.    BP 151/78  HR 64 Blood pressure 146/75, pulse 59, temperature 98.7 F (37.1 C), temperature source Oral, resp. rate 16, height 6' (1.829 m), weight 92.534 kg (204 lb), SpO2 99.00%. Physical Exam  Constitutional: He is oriented to person, place, and time. He appears well-developed and well-nourished.  Pale and having allot of pain RUQ  HENT:  Head: Normocephalic and atraumatic.  Nose: Nose normal.  Eyes: Conjunctivae and EOM are normal. Pupils are equal, round, and reactive to light. Right eye exhibits no discharge. Left eye exhibits no discharge. No scleral icterus.  Neck: Normal range of motion. Neck supple. No JVD present. No tracheal deviation present. No thyromegaly present.  Cardiovascular: Normal rate, regular rhythm, normal heart sounds and intact distal pulses.  Exam reveals no gallop.   No murmur heard. Respiratory: Effort normal and breath sounds normal. No respiratory distress. He has no wheezes. He has no rales. He exhibits no tenderness.  GI: Soft. Bowel sounds are normal. He exhibits no distension. There is tenderness (tender RUQ, Better after fentanyl).  Musculoskeletal: He exhibits no edema and no tenderness.  Lymphadenopathy:    He has no cervical  adenopathy.  Neurological: He is alert and oriented to person, place, and time. No cranial nerve deficit.  Skin: Skin is warm and dry. No rash noted. No erythema. There is pallor.  Psychiatric: He has a normal mood and affect. His behavior is normal. Judgment and thought content normal.     Assessment/Plan Cholecystitis and cholelithiasis AODM, on Metformin Hx of tobacco use 20 years 1-1.5PPD, quit 1993. Macular degeneration  Plan:  Hydrate, he was treated with Zosyn in [place], and transferred to [facility].  Dr. Excell Seltzer has seen him and plans surgery this afternoon.  Earnstine Regal 03/28/2014, 1:26 PM

## 2014-03-28 NOTE — ED Notes (Signed)
Report called to unit RN [facility] 5 [place]

## 2014-03-28 NOTE — ED Notes (Signed)
Patient transported to Ultrasound 

## 2014-03-29 ENCOUNTER — Encounter (HOSPITAL_COMMUNITY): Payer: Self-pay | Admitting: General Surgery

## 2014-03-29 DIAGNOSIS — R5381 Other malaise: Secondary | ICD-10-CM | POA: Diagnosis not present

## 2014-03-29 DIAGNOSIS — K8066 Calculus of gallbladder and bile duct with acute and chronic cholecystitis without obstruction: Secondary | ICD-10-CM | POA: Diagnosis not present

## 2014-03-29 LAB — COMPREHENSIVE METABOLIC PANEL
ALT: 26 U/L (ref 0–53)
AST: 30 U/L (ref 0–37)
Albumin: 2.8 g/dL — ABNORMAL LOW (ref 3.5–5.2)
Alkaline Phosphatase: 36 U/L — ABNORMAL LOW (ref 39–117)
BUN: 14 mg/dL (ref 6–23)
CO2: 25 meq/L (ref 19–32)
Calcium: 8.4 mg/dL (ref 8.4–10.5)
Chloride: 99 mEq/L (ref 96–112)
Creatinine, Ser: 1.26 mg/dL (ref 0.50–1.35)
GFR calc Af Amer: 63 mL/min — ABNORMAL LOW (ref >90)
GFR, EST NON AFRICAN AMERICAN: 54 mL/min — AB (ref >90)
Glucose, Bld: 150 mg/dL — ABNORMAL HIGH (ref 70–99)
POTASSIUM: 4.1 meq/L (ref 3.7–5.3)
SODIUM: 136 meq/L — AB (ref 137–147)
TOTAL PROTEIN: 5.9 g/dL — AB (ref 6.0–8.3)
Total Bilirubin: 1.3 mg/dL — ABNORMAL HIGH (ref 0.3–1.2)

## 2014-03-29 LAB — CBC
HCT: 35.6 % — ABNORMAL LOW (ref 39.0–52.0)
Hemoglobin: 11.9 g/dL — ABNORMAL LOW (ref 13.0–17.0)
MCH: 31 pg (ref 26.0–34.0)
MCHC: 33.4 g/dL (ref 30.0–36.0)
MCV: 92.7 fL (ref 78.0–100.0)
Platelets: 159 10*3/uL (ref 150–400)
RBC: 3.84 MIL/uL — AB (ref 4.22–5.81)
RDW: 13.5 % (ref 11.5–15.5)
WBC: 13.6 10*3/uL — ABNORMAL HIGH (ref 4.0–10.5)

## 2014-03-29 LAB — GLUCOSE, CAPILLARY
GLUCOSE-CAPILLARY: 138 mg/dL — AB (ref 70–99)
Glucose-Capillary: 127 mg/dL — ABNORMAL HIGH (ref 70–99)
Glucose-Capillary: 133 mg/dL — ABNORMAL HIGH (ref 70–99)
Glucose-Capillary: 134 mg/dL — ABNORMAL HIGH (ref 70–99)
Glucose-Capillary: 128 mg/dL — ABNORMAL HIGH (ref 70–99)

## 2014-03-29 MED ORDER — TAMSULOSIN HCL 0.4 MG PO CAPS
0.4000 mg | ORAL_CAPSULE | Freq: Every day | ORAL | Status: DC
  Administered 2014-03-29 – 2014-03-30 (×2): 0.4 mg via ORAL
  Filled 2014-03-29 (×3): qty 1

## 2014-03-29 MED ORDER — AMOXICILLIN-POT CLAVULANATE 875-125 MG PO TABS: 1.0000 | ORAL_TABLET | Freq: Two times a day (BID) | ORAL | Status: DC

## 2014-03-29 MED ORDER — MEPERIDINE HCL 50 MG PO TABS: 25.0000 mg | ORAL_TABLET | ORAL | Status: DC | PRN

## 2014-03-29 MED ORDER — MEPERIDINE HCL 50 MG PO TABS
25.0000 mg | ORAL_TABLET | ORAL | Status: DC | PRN
  Administered 2014-03-29: 50 mg via ORAL
  Filled 2014-03-29: qty 1

## 2014-03-29 NOTE — Progress Notes (Signed)
UR completed. Patient changed to inpatient- con't to require IV antibiotics, IVF and IV pain medication

## 2014-03-29 NOTE — Care Management Note (Signed)
    Page 1 of 1   03/29/2014     2:46:12 PM CARE MANAGEMENT NOTE 03/29/2014  Patient:  Bryan Turner, Bryan Turner   Account Number:  [id number]  Date Initiated:  03/29/2014  Documentation initiated by:  Sunday Spillers  Subjective/Objective Assessment:   73 yo male admitted s/p lap chole with gangrene. PTA lived at home with spouse.     Action/Plan:   Home when stable   Anticipated DC Date:  04/07/2014   Anticipated DC Plan:  [place]  CM consult      [facility] Choice  HOME HEALTH   Choice offered to / List presented to:  C-4 Adult Children           Status of service:  In process, will continue to follow Medicare Important Message given?   (If response is "NO", the following Medicare IM given date fields will be blank) Date Medicare IM given:   Date Additional Medicare IM given:    Discharge Disposition:  [place]  Per UR Regulation:  Reviewed for med. necessity/level of care/duration of stay  If discussed at [place] of Stay Meetings, dates discussed:    Comments:  03-29-14 [place] [phone] Spoke with patient and daughter at bedside. Discussed PT recommendations for SNF and HH. Patient continues to decline SNF. Agree to [facility] services. Provided them with [facility] list for choice. They are going to review the list and make a choice.

## 2014-03-29 NOTE — Progress Notes (Signed)
In and out cath performed as per order, approximately 600 ccs amber urine returned.  Patient states he feels better after urine drained.

## 2014-03-29 NOTE — Progress Notes (Signed)
Patient has not voided this shift, did attempt to use urinal but nothing came out per patient and tech.  Bladder scan performed, read >375 mls.  Notified [place], [place], orders obtained for in and out cath as well as flomax.

## 2014-03-29 NOTE — Progress Notes (Signed)
Patient rhas congested nonproductive cough  -states he had this cough prior to coming to hospital ,afebrile at 98.7. DR Lucia Gaskins notified and instructed to have patient use incentive spirometer.

## 2014-03-29 NOTE — Progress Notes (Signed)
I have interviewed and examined this patient. I agree with the assessment and treatment plan outlined by Ms. Osborne.  He will need to be on antibiotics for a few days. He is a high fall risk and PT will be involved as well.  Edsel Petrin. Dalbert Batman, M.D., [facility] General and Minimally invasive Surgery Breast and Colorectal Surgery Office:   [phone] Pager:   [phone] \

## 2014-03-29 NOTE — Evaluation (Signed)
Physical Therapy Evaluation Patient Details Name: TAYRON HUNNELL MRN: [id number] DOB: Jul 26, 1941 Today's Date: 03/29/2014   History of Present Illness  s/p lap chole 03/28/14  Clinical Impression  Pt presents with decreased balance while ambulating. Pt will benefit from PT to address problems listed. Pt declined SNF rehab although he needs balance remediation. Pt will accept HHPT.    Follow Up Recommendations SNF;Supervision/Assistance - 24 hour (pt declined SNF, will accept HHPT although he could benefit from SNF with falls history)    Equipment Recommendations  Rolling walker with 5" wheels    Recommendations for Other Services       Precautions / Restrictions Precautions Precautions: Fall      Mobility  Bed Mobility Overal bed mobility: Needs Assistance Bed Mobility: Rolling;Sidelying to Sit Rolling: Min assist Sidelying to sit: Mod assist;Min assist       General bed mobility comments: cues to roll  to side and then push up, required   Transfers Overall transfer level: Needs assistance Equipment used: Rolling walker (2 wheeled) Transfers: Sit to/from Stand Sit to Stand: Mod assist         General transfer comment: cues for hand palcement, required  lifting assist from lower bed level., support  to stabilize after standing up at RW, tendency for posterior lean  Ambulation/Gait Ambulation/Gait assistance: Mod assist Ambulation Distance (Feet): 90 Feet Assistive device: Rolling walker (2 wheeled) Gait Pattern/deviations: Step-through pattern;Decreased stride length;Decreased weight shift to right;Decreased weight shift to left;Staggering right;Drifts right/left;Narrow base of support   Gait velocity interpretation: <1.8 ft/sec, indicative of risk for recurrent falls General Gait Details: noted to  have icrease on posterior balance loss  with turning of RW. Stability required for balance.  Stairs            Wheelchair Mobility    Modified Rankin (Stroke  Patients Only)       Balance Overall balance assessment: Needs assistance Sitting-balance support: No upper extremity supported;Feet supported Sitting balance-Leahy Scale: Fair   Postural control: Posterior lean Standing balance support: Bilateral upper extremity supported;During functional activity Standing balance-Leahy Scale: Poor                               Pertinent Vitals/Pain abd pain 8- RN getting medication    Home Living Family/patient expects to be discharged to:: Private residence Living Arrangements: Spouse/significant other Available Help at Discharge: Family Type of Home: House Home Access: Stairs to enter   Technical brewer of Steps: 1 Home Layout: One level Home Equipment: Cane - single point;Shower seat;Grab bars - tub/shower;Toilet riser Additional Comments: does not use cane very often. Has had 3-4 falls    Prior Function Level of Independence: Independent               Hand Dominance        Extremity/Trunk Assessment   Upper Extremity Assessment: Generalized weakness           Lower Extremity Assessment: Generalized weakness;RLE deficits/detail;LLE deficits/detail RLE Deficits / Details: noted toes flexed during gait. LLE Deficits / Details: same as R     Communication      Cognition Arousal/Alertness: Awake/alert Behavior During Therapy: WFL for tasks assessed/performed Overall Cognitive Status: Within Functional Limits for tasks assessed Area of Impairment: Safety/judgement     Memory: Decreased recall of precautions              General Comments      Exercises  Assessment/Plan    PT Assessment Patient needs continued PT services  PT Diagnosis Difficulty walking;Generalized weakness   PT Problem List Decreased strength;Decreased activity tolerance;Decreased balance;Decreased mobility;Decreased knowledge of precautions;Decreased safety awareness;Decreased knowledge of use of DME;Pain   PT Treatment Interventions DME instruction;Gait training;Stair training;Functional mobility training;Therapeutic activities;Therapeutic exercise;Patient/family education   PT Goals (Current goals can be found in the Care Plan section) Acute Rehab PT Goals Patient Stated Goal: I want to go haome PT Goal Formulation: With patient/family Time For Goal Achievement: 04/12/14 Potential to Achieve Goals: Good    Frequency Min 3X/week   Barriers to discharge        Co-evaluation               End of Session Equipment Utilized During Treatment: Gait belt Activity Tolerance: Patient limited by pain Patient left: in chair;with call bell/phone within reach;with chair alarm set;with family/visitor present Nurse Communication: Mobility status         Time: 6803-2122 PT Time Calculation (min): 30 min   Charges:   PT Evaluation $Initial PT Evaluation Tier I: 1 Procedure PT Treatments $Gait Training: 23-37 mins   PT G Codes:          Claretha Cooper 03/29/2014, 11:45 AM Tresa Endo PT [phone]

## 2014-03-29 NOTE — Progress Notes (Signed)
Patient ID: Bryan Turner, male   DOB: Nov 29, 1940, 73 y.o.   MRN: [id number] 1 Day Post-Op  Subjective: Pt having pain.  Pain control issues as fentanyl is not lasting long enough.  Tolerating a solid diet.  Wife reports 3 recent falls at home with one episode resulting in a rib fx.  Son reports very unsteady last night when trying to walk to bathroom last night.  Objective: Vital signs in last 24 hours: Temp:  [97.2 F (36.2 C)-100 F (37.8 C)] 98.5 F (36.9 C) (05/15 0530) Pulse Rate:  [55-81] 75 (05/15 0530) Resp:  [16-24] 16 (05/15 0530) BP: (118-170)/(64-82) 132/70 mmHg (05/15 0530) SpO2:  [94 %-100 %] 94 % (05/15 0530)    Intake/Output from previous day: 05/14 0701 - 05/15 0700 In: 1417.5 [I.V.:1417.5] Out: 400 [Urine:400] Intake/Output this shift:    PE: Abd: soft, appropriately tender, +BS, incisions c/d/i, ND Heart: regular Lungs: CTAB  Lab Results:   Recent Labs  03/28/14 0845 03/29/14 0405  WBC 12.0* 13.6*  HGB 13.4 11.9*  HCT 39.6 35.6*  PLT 189 159   BMET  Recent Labs  03/28/14 0845 03/29/14 0405  NA 143 136*  K 4.5 4.1  CL 105 99  CO2 24 25  GLUCOSE 185* 150*  BUN 18 14  CREATININE 1.10 1.26  CALCIUM 9.2 8.4   PT/INR No results found for this basename: LABPROT, INR,  in the last 72 hours CMP     Component Value Date/Time   NA 136* 03/29/2014 0405   K 4.1 03/29/2014 0405   CL 99 03/29/2014 0405   CO2 25 03/29/2014 0405   GLUCOSE 150* 03/29/2014 0405   BUN 14 03/29/2014 0405   CREATININE 1.26 03/29/2014 0405   CALCIUM 8.4 03/29/2014 0405   PROT 5.9* 03/29/2014 0405   ALBUMIN 2.8* 03/29/2014 0405   AST 30 03/29/2014 0405   ALT 26 03/29/2014 0405   ALKPHOS 36* 03/29/2014 0405   BILITOT 1.3* 03/29/2014 0405   GFRNONAA 54* 03/29/2014 0405   GFRAA 63* 03/29/2014 0405   Lipase     Component Value Date/Time   LIPASE 27 03/28/2014 0845       Studies/Results: Dg Cholangiogram Operative  03/28/2014   CLINICAL DATA:  surgery right upper abdominal  plan  EXAM: INTRAOPERATIVE CHOLANGIOGRAM  TECHNIQUE: Cholangiographic images from the C-arm fluoroscopic device were submitted for interpretation post-operatively. Please see the procedural report for the amount of contrast and the fluoroscopy time utilized.  COMPARISON:  None.  FINDINGS: No persistent filling defects in the common duct. Intrahepatic ducts are incompletely visualized, appearing decompressed centrally. Contrast passes into the duodenum.  : Negative for retained common duct stone.   Electronically Signed   By: Arne Cleveland M.D.   On: 03/28/2014 16:25   US Abdomen Complete  03/28/2014   CLINICAL DATA:  Intermittent right upper quadrant pain extending to upper back. Diabetes.  EXAM: ULTRASOUND ABDOMEN COMPLETE  COMPARISON:  Report from 05/15/1999 ultrasound. Images not available.  FINDINGS: Gallbladder:  Multiple mobile bile gallstones. Mild gallbladder wall thickening. No pericholecystic fluid. Patient was not tender over this region during scanning per ultrasound technologist.  Common bile duct:  Diameter: 6.6 mm. Distal aspect not visualized secondary to bowel gas.  Liver:  No focal lesion identified. Within normal limits in parenchymal echogenicity.  IVC:  Poorly delineated secondary to bowel gas.  Pancreas:  Poorly delineated secondary to bowel gas.  Spleen:  Size and appearance within normal limits.  Right Kidney:  Length: 12.0  cm. Mild renal parenchymal thinning. No hydronephrosis or obvious mass. Evaluation slightly limited by bowel gas.  Left Kidney:  Length: 10.6 cm. Mild renal parenchymal thinning. No hydronephrosis or obvious mass. Evaluation slightly limited by bowel gas.  Abdominal aorta:  Evaluation limited by bowel gas. Atherosclerotic type changes noted. Maximal transverse dimension obtained of 2.9 cm.  Other findings:  None.  IMPRESSION: Multiple mobile bile gallstones. Mild gallbladder wall thickening. No pericholecystic fluid. Patient was not tender over this region during  scanning per ultrasound technologist.  Mild bilateral renal parenchymal thinning without hydronephrosis.  Secondary to bowel gas, limited evaluation of distal common bile duct, inferior vena cava, pancreas, portions of the kidneys and abdominal aorta.  Atherosclerotic type changes of the aorta suspected with maximal transverse dimension 2.9 cm   Electronically Signed   By: Chauncey Cruel M.D.   On: 03/28/2014 09:39    Anti-infectives: Anti-infectives   Start     Dose/Rate Route Frequency Ordered Stop   03/28/14 2200  piperacillin-tazobactam (ZOSYN) IVPB 3.375 g     3.375 g 12.5 mL/hr over 4 Hours Intravenous 3 times per day 03/28/14 1759     03/28/14 2000  piperacillin-tazobactam (ZOSYN) IVPB 3.375 g  Status:  Discontinued     3.375 g 12.5 mL/hr over 4 Hours Intravenous 3 times per day 03/28/14 1753 03/28/14 1759   03/28/14 1530  piperacillin-tazobactam (ZOSYN) IVPB 3.375 g     3.375 g 100 mL/hr over 30 Minutes Intravenous  Once 03/28/14 1528 03/28/14 1515   03/28/14 1030  piperacillin-tazobactam (ZOSYN) IVPB 3.375 g     3.375 g 100 mL/hr over 30 Minutes Intravenous  Once 03/28/14 1025 03/28/14 1115       Assessment/Plan  1. POD 1 s/p lap chole for gangrenous cholecystitis 2. Gait instability/deconditioning 3. DM  Plan: 1. Due to gangrenous cholecystitis, will leave patient on at least another 24 hours of IV abx therapy.  Recheck labs in AM 2. Regular diet, carb modified 3. PT eval and treatment given gait instability issues.  I have thoroughly discussed this with him and his family about following up with his PCP about this issues as he has already had 3 falls at home since December, one resulting in a rib fx.  They are agreeable to this 4. I have also d/w pharmacy his pain regimen as he is essentially anaphylactic to codeine.  I will start him on oral demerol for pain control with tramadol and tylenol as back ups.  The only other option is methadone, which I am not prescribing.   5.  Hopefully home tomorrow or Sunday.     LOS: 1 day    Henreitta Cea 03/29/2014, 9:57 AM Pager: [phone]

## 2014-03-29 NOTE — Discharge Instructions (Signed)
Laparoscopic Cholecystectomy, Care After °Refer to this sheet in the next few weeks. These instructions provide you with information on caring for yourself after your procedure. Your health care provider may also give you more specific instructions. Your treatment has been planned according to current medical practices, but problems sometimes occur. Call your health care provider if you have any problems or questions after your procedure. °WHAT TO EXPECT AFTER THE PROCEDURE °After your procedure, it is typical to have the following: °· Pain at your incision sites. You will be given pain medicines to control the pain. °· Mild nausea or vomiting. This should improve after the first 24 hours. °· Bloating and possibly shoulder pain from the gas used during the procedure. This will improve after the first 24 hours. °HOME CARE INSTRUCTIONS  °· Change bandages (dressings) as directed by your health care provider. °· Keep the wound dry and clean. You may wash the wound gently with soap and water. Gently blot or dab the area dry. °· Do not take baths or use swimming pools or hot tubs for 2 weeks or until your health care provider approves. °· Only take over-the-counter or prescription medicines as directed by your health care provider. °· Continue your normal diet as directed by your health care provider. °· Do not lift anything heavier than 10 pounds (4.5 kg) until your health care provider approves. °· Do not play contact sports for 1 week or until your health care provider approves. °SEEK MEDICAL CARE IF:  °· You have redness, swelling, or increasing pain in the wound. °· You notice yellowish-white fluid (pus) coming from the wound. °· You have drainage from the wound that lasts longer than 1 day. °· You notice a bad smell coming from the wound or dressing. °· Your surgical cuts (incisions) break open. °SEEK IMMEDIATE MEDICAL CARE IF:  °· You develop a rash. °· You have difficulty breathing. °· You have chest pain. °· You  have a fever. °· You have increasing pain in the shoulders (shoulder strap areas). °· You have dizzy episodes or faint while standing. °· You have severe abdominal pain. °· You feel sick to your stomach (nauseous) or throw up (vomit) and this lasts for more than 1 day. °Document Released: 11/01/2005 Document Revised: 08/22/2013 Document Reviewed: 06/13/2013 °ExitCare® Patient Information ©2014 [employer]. ° °CCS ______[facility] °LAPAROSCOPIC SURGERY: POST OP INSTRUCTIONS °Always review your discharge instruction sheet given to you by the facility where your surgery was performed. °IF YOU HAVE DISABILITY OR FAMILY LEAVE FORMS, YOU MUST BRING THEM TO THE OFFICE FOR PROCESSING.   °DO NOT GIVE THEM TO YOUR DOCTOR. ° °1. A prescription for pain medication may be given to you upon discharge.  Take your pain medication as prescribed, if needed.  If narcotic pain medicine is not needed, then you may take acetaminophen (Tylenol) or ibuprofen (Advil) as needed. °2. Take your usually prescribed medications unless otherwise directed. °3. If you need a refill on your pain medication, please contact your pharmacy.  They will contact our office to request authorization. Prescriptions will not be filled after 5pm or on week-ends. °4. You should follow a light diet the first few days after arrival home, such as soup and crackers, etc.  Be sure to include lots of fluids daily. °5. Most patients will experience some swelling and bruising in the area of the incisions.  Ice packs will help.  Swelling and bruising can take several days to resolve.  °6. It is common   to experience some constipation if taking pain medication after surgery.  Increasing fluid intake and taking a stool softener (such as Colace) will usually help or prevent this problem from occurring.  A mild laxative (Milk of Magnesia or Miralax) should be taken according to package instructions if there are no bowel movements after 48  hours. °7. Unless discharge instructions indicate otherwise, you may remove your bandages 24-48 hours after surgery, and you may shower at that time.  You may have steri-strips (small skin tapes) in place directly over the incision.  These strips should be left on the skin for 7-10 days.  If your surgeon used skin glue on the incision, you may shower in 24 hours.  The glue will flake off over the next 2-3 weeks.  Any sutures or staples will be removed at the office during your follow-up visit. °8. ACTIVITIES:  You may resume regular (light) daily activities beginning the next day--such as daily self-care, walking, climbing stairs--gradually increasing activities as tolerated.  You may have sexual intercourse when it is comfortable.  Refrain from any heavy lifting or straining until approved by your doctor. °a. You may drive when you are no longer taking prescription pain medication, you can comfortably wear a seatbelt, and you can safely maneuver your car and apply brakes. °b. RETURN TO WORK:  __________________________________________________________ °9. You should see your doctor in the office for a follow-up appointment approximately 2-3 weeks after your surgery.  Make sure that you call for this appointment within a day or two after you arrive home to insure a convenient appointment time. °10. OTHER INSTRUCTIONS: __________________________________________________________________________________________________________________________ __________________________________________________________________________________________________________________________ °WHEN TO CALL YOUR DOCTOR: °1. Fever over 101.0 °2. Inability to urinate °3. Continued bleeding from incision. °4. Increased pain, redness, or drainage from the incision. °5. Increasing abdominal pain ° °The clinic staff is available to answer your questions during regular business hours.  Please don’t hesitate to call and ask to speak to one of the nurses for  clinical concerns.  If you have a medical emergency, go to the nearest emergency room or call 911.  A surgeon from [facility] is always on call at the hospital. °[address], [address], [place], [place]  [address] ? [address] [address], [place], [place]   [address] °[phone] ? [phone] ? FAX [phone] °Web site: [email or web address] ° °

## 2014-03-30 LAB — COMPREHENSIVE METABOLIC PANEL
ALBUMIN: 2.7 g/dL — AB (ref 3.5–5.2)
ALT: 31 U/L (ref 0–53)
AST: 34 U/L (ref 0–37)
Alkaline Phosphatase: 40 U/L (ref 39–117)
BUN: 11 mg/dL (ref 6–23)
CALCIUM: 8.3 mg/dL — AB (ref 8.4–10.5)
CHLORIDE: 99 meq/L (ref 96–112)
CO2: 21 mEq/L (ref 19–32)
Creatinine, Ser: 1.02 mg/dL (ref 0.50–1.35)
GFR calc Af Amer: 82 mL/min — ABNORMAL LOW (ref >90)
GFR calc non Af Amer: 70 mL/min — ABNORMAL LOW (ref >90)
Glucose, Bld: 140 mg/dL — ABNORMAL HIGH (ref 70–99)
Potassium: 3.9 mEq/L (ref 3.7–5.3)
Sodium: 135 mEq/L — ABNORMAL LOW (ref 137–147)
TOTAL PROTEIN: 6.2 g/dL (ref 6.0–8.3)
Total Bilirubin: 1.3 mg/dL — ABNORMAL HIGH (ref 0.3–1.2)

## 2014-03-30 LAB — URINALYSIS, ROUTINE W REFLEX MICROSCOPIC
GLUCOSE, UA: NEGATIVE mg/dL
Hgb urine dipstick: NEGATIVE
LEUKOCYTES UA: NEGATIVE
NITRITE: NEGATIVE
Protein, ur: 30 mg/dL — AB
SPECIFIC GRAVITY, URINE: 1.021 (ref 1.005–1.030)
Urobilinogen, UA: 1 mg/dL (ref 0.0–1.0)
pH: 5.5 (ref 5.0–8.0)

## 2014-03-30 LAB — CBC
HEMATOCRIT: 36.8 % — AB (ref 39.0–52.0)
Hemoglobin: 12.4 g/dL — ABNORMAL LOW (ref 13.0–17.0)
MCH: 30.9 pg (ref 26.0–34.0)
MCHC: 33.7 g/dL (ref 30.0–36.0)
MCV: 91.8 fL (ref 78.0–100.0)
PLATELETS: 160 10*3/uL (ref 150–400)
RBC: 4.01 MIL/uL — ABNORMAL LOW (ref 4.22–5.81)
RDW: 13.2 % (ref 11.5–15.5)
WBC: 13.6 10*3/uL — AB (ref 4.0–10.5)

## 2014-03-30 LAB — URINE MICROSCOPIC-ADD ON

## 2014-03-30 LAB — GLUCOSE, CAPILLARY
GLUCOSE-CAPILLARY: 172 mg/dL — AB (ref 70–99)
Glucose-Capillary: 149 mg/dL — ABNORMAL HIGH (ref 70–99)

## 2014-03-30 NOTE — Progress Notes (Signed)
Patient ID: Bryan Turner, male   DOB: Dec 11, 1940, 73 y.o.   MRN: [id number] 2 Days Post-Op  Subjective: Still very weak. Needing assistance to get up. Some mild nausea, drinking some fluids but no appetite. Pain is much better than before surgery and improving daily. No flatus or bowel movements yet. Notices urine is dark with an unusual odor.  Objective: Vital signs in last 24 hours: Temp:  [98.1 F (36.7 C)-99 F (37.2 C)] 98.1 F (36.7 C) (05/16 0646) Pulse Rate:  [70-83] 77 (05/16 0646) Resp:  [18] 18 (05/16 0646) BP: (103-129)/(62-77) 112/72 mmHg (05/16 0646) SpO2:  [90 %-95 %] 90 % (05/16 0646) Last BM Date: 03/28/14  Intake/Output from previous day: 05/15 0701 - 05/16 0700 In: 1370 [P.O.:720; I.V.:600; IV Piggyback:50] Out: 1025 [Urine:1025] Intake/Output this shift:    General appearance: alert, cooperative, fatigued and no distress Resp: no wheezing or increased work of breathing GI: mild distention. Soft and nontender. Bowel sounds hypoactive. Incision/Wound: no erythema or drainage  Lab Results:   Recent Labs  03/29/14 0405 03/30/14 0449  WBC 13.6* 13.6*  HGB 11.9* 12.4*  HCT 35.6* 36.8*  PLT 159 160   BMET  Recent Labs  03/29/14 0405 03/30/14 0449  NA 136* 135*  K 4.1 3.9  CL 99 99  CO2 25 21  GLUCOSE 150* 140*  BUN 14 11  CREATININE 1.26 1.02  CALCIUM 8.4 8.3*   CBG (last 3)   Recent Labs  03/29/14 1156 03/29/14 1630 03/29/14 2219  GLUCAP 134* 128* 138*    Lab Results  Component Value Date   ALT 31 03/30/2014   AST 34 03/30/2014   ALKPHOS 40 03/30/2014   BILITOT 1.3* 03/30/2014     Studies/Results: Dg Cholangiogram Operative  03/28/2014   CLINICAL DATA:  surgery right upper abdominal plan  EXAM: INTRAOPERATIVE CHOLANGIOGRAM  TECHNIQUE: Cholangiographic images from the C-arm fluoroscopic device were submitted for interpretation post-operatively. Please see the procedural report for the amount of contrast and the fluoroscopy time  utilized.  COMPARISON:  None.  FINDINGS: No persistent filling defects in the common duct. Intrahepatic ducts are incompletely visualized, appearing decompressed centrally. Contrast passes into the duodenum.  : Negative for retained common duct stone.   Electronically Signed   By: Arne Cleveland M.D.   On: 03/28/2014 16:25   US Abdomen Complete  03/28/2014   CLINICAL DATA:  Intermittent right upper quadrant pain extending to upper back. Diabetes.  EXAM: ULTRASOUND ABDOMEN COMPLETE  COMPARISON:  Report from 05/15/1999 ultrasound. Images not available.  FINDINGS: Gallbladder:  Multiple mobile bile gallstones. Mild gallbladder wall thickening. No pericholecystic fluid. Patient was not tender over this region during scanning per ultrasound technologist.  Common bile duct:  Diameter: 6.6 mm. Distal aspect not visualized secondary to bowel gas.  Liver:  No focal lesion identified. Within normal limits in parenchymal echogenicity.  IVC:  Poorly delineated secondary to bowel gas.  Pancreas:  Poorly delineated secondary to bowel gas.  Spleen:  Size and appearance within normal limits.  Right Kidney:  Length: 12.0 cm. Mild renal parenchymal thinning. No hydronephrosis or obvious mass. Evaluation slightly limited by bowel gas.  Left Kidney:  Length: 10.6 cm. Mild renal parenchymal thinning. No hydronephrosis or obvious mass. Evaluation slightly limited by bowel gas.  Abdominal aorta:  Evaluation limited by bowel gas. Atherosclerotic type changes noted. Maximal transverse dimension obtained of 2.9 cm.  Other findings:  None.  IMPRESSION: Multiple mobile bile gallstones. Mild gallbladder wall thickening. No pericholecystic fluid.  Patient was not tender over this region during scanning per ultrasound technologist.  Mild bilateral renal parenchymal thinning without hydronephrosis.  Secondary to bowel gas, limited evaluation of distal common bile duct, inferior vena cava, pancreas, portions of the kidneys and abdominal aorta.   Atherosclerotic type changes of the aorta suspected with maximal transverse dimension 2.9 cm   Electronically Signed   By: Chauncey Cruel M.D.   On: 03/28/2014 09:39    Anti-infectives: Anti-infectives   Start     Dose/Rate Route Frequency Ordered Stop   03/29/14 0000  amoxicillin-clavulanate (AUGMENTIN) 875-125 MG per tablet     1 tablet Oral 2 times daily 03/29/14 1505     03/28/14 2200  piperacillin-tazobactam (ZOSYN) IVPB 3.375 g     3.375 g 12.5 mL/hr over 4 Hours Intravenous 3 times per day 03/28/14 1759     03/28/14 2000  piperacillin-tazobactam (ZOSYN) IVPB 3.375 g  Status:  Discontinued     3.375 g 12.5 mL/hr over 4 Hours Intravenous 3 times per day 03/28/14 1753 03/28/14 1759   03/28/14 1530  piperacillin-tazobactam (ZOSYN) IVPB 3.375 g     3.375 g 100 mL/hr over 30 Minutes Intravenous  Once 03/28/14 1528 03/28/14 1515   03/28/14 1030  piperacillin-tazobactam (ZOSYN) IVPB 3.375 g     3.375 g 100 mL/hr over 30 Minutes Intravenous  Once 03/28/14 1025 03/28/14 1115      Assessment/Plan: s/p Procedure(s): LAPAROSCOPIC CHOLECYSTECTOMY WITH INTRAOPERATIVE CHOLANGIOGRAM Acute gangrenous cholecystitis. Diabetes mellitus. Still with some ileus and weak. White blood count remains mildly elevated. Continue IV antibiotics today. Ambulation and oral intake encouraged. Check UA today, CBC and chemistries in a.m.   LOS: 2 days    [name] 03/30/2014

## 2014-03-31 LAB — CBC
HCT: 34 % — ABNORMAL LOW (ref 39.0–52.0)
HEMOGLOBIN: 11.6 g/dL — AB (ref 13.0–17.0)
MCH: 30.6 pg (ref 26.0–34.0)
MCHC: 34.1 g/dL (ref 30.0–36.0)
MCV: 89.7 fL (ref 78.0–100.0)
Platelets: 183 10*3/uL (ref 150–400)
RBC: 3.79 MIL/uL — ABNORMAL LOW (ref 4.22–5.81)
RDW: 13.2 % (ref 11.5–15.5)
WBC: 11.3 10*3/uL — ABNORMAL HIGH (ref 4.0–10.5)

## 2014-03-31 LAB — COMPREHENSIVE METABOLIC PANEL
ALT: 24 U/L (ref 0–53)
AST: 22 U/L (ref 0–37)
Albumin: 2.3 g/dL — ABNORMAL LOW (ref 3.5–5.2)
Alkaline Phosphatase: 43 U/L (ref 39–117)
BILIRUBIN TOTAL: 0.9 mg/dL (ref 0.3–1.2)
BUN: 11 mg/dL (ref 6–23)
CHLORIDE: 105 meq/L (ref 96–112)
CO2: 21 mEq/L (ref 19–32)
CREATININE: 0.94 mg/dL (ref 0.50–1.35)
Calcium: 8.2 mg/dL — ABNORMAL LOW (ref 8.4–10.5)
GFR calc non Af Amer: 80 mL/min — ABNORMAL LOW (ref >90)
GLUCOSE: 159 mg/dL — AB (ref 70–99)
POTASSIUM: 4 meq/L (ref 3.7–5.3)
Sodium: 139 mEq/L (ref 137–147)
Total Protein: 5.5 g/dL — ABNORMAL LOW (ref 6.0–8.3)

## 2014-03-31 LAB — GLUCOSE, CAPILLARY
GLUCOSE-CAPILLARY: 147 mg/dL — AB (ref 70–99)
GLUCOSE-CAPILLARY: 162 mg/dL — AB (ref 70–99)

## 2014-03-31 NOTE — Progress Notes (Signed)
Assessment unchanged. Pt and wife verbalized understanding of dc instructions through teach back. Scripts x 2 given as provided by MD. CM met with pt and family prior to dc regarding [facility] needs- see note. Discharged via wc to front entrance to meet awaiting vehicle to carry home. Accompanied by NT, wife and daughter.

## 2014-03-31 NOTE — Care Management Note (Signed)
Cm spoke with patient at the bedside concerning discharge planning. Pt declined SNF. MD orders for PT & RW. Per pt choice [facility] to provide [facility] services at discharge. [facility] rep notified at [phone]. Rw present at bedside prior to discharge. Pt's daughter to provide transport home. No other needs identified at this time.     Venita Lick Jillisa Harris,MSN,RN [phone]

## 2014-03-31 NOTE — Progress Notes (Signed)
Patient ID: Bryan Turner, male   DOB: October 11, 1941, 73 y.o.   MRN: [id number] 3 Days Post-Op  Subjective: Feels much better, once to go home. Denies pain. Tolerating diet. Having flatus.  Objective: Vital signs in last 24 hours: Temp:  [97.9 F (36.6 C)-98.5 F (36.9 C)] 98.2 F (36.8 C) (05/17 0532) Pulse Rate:  [80-84] 84 (05/17 0532) Resp:  [16-18] 16 (05/17 0532) BP: (127-146)/(72-87) 146/87 mmHg (05/17 0532) SpO2:  [93 %-94 %] 94 % (05/17 0532) Last BM Date: 03/28/14  Intake/Output from previous day: 05/16 0701 - 05/17 0700 In: 3610 [P.O.:360; I.V.:3000; IV Piggyback:250] Out: -  Intake/Output this shift:    General appearance: alert, cooperative, no distress and looking much better than yesterday GI: normal findings: soft, non-tender Incision/Wound: no erythema or drainage  Lab Results:   Recent Labs  03/30/14 0449 03/31/14 0518  WBC 13.6* 11.3*  HGB 12.4* 11.6*  HCT 36.8* 34.0*  PLT 160 183   BMET  Recent Labs  03/30/14 0449 03/31/14 0518  NA 135* 139  K 3.9 4.0  CL 99 105  CO2 21 21  GLUCOSE 140* 159*  BUN 11 11  CREATININE 1.02 0.94  CALCIUM 8.3* 8.2*     Studies/Results: No results found.  Anti-infectives: Anti-infectives   Start     Dose/Rate Route Frequency Ordered Stop   03/29/14 0000  amoxicillin-clavulanate (AUGMENTIN) 875-125 MG per tablet     1 tablet Oral 2 times daily 03/29/14 1505     03/28/14 2200  piperacillin-tazobactam (ZOSYN) IVPB 3.375 g     3.375 g 12.5 mL/hr over 4 Hours Intravenous 3 times per day 03/28/14 1759     03/28/14 2000  piperacillin-tazobactam (ZOSYN) IVPB 3.375 g  Status:  Discontinued     3.375 g 12.5 mL/hr over 4 Hours Intravenous 3 times per day 03/28/14 1753 03/28/14 1759   03/28/14 1530  piperacillin-tazobactam (ZOSYN) IVPB 3.375 g     3.375 g 100 mL/hr over 30 Minutes Intravenous  Once 03/28/14 1528 03/28/14 1515   03/28/14 1030  piperacillin-tazobactam (ZOSYN) IVPB 3.375 g     3.375 g 100 mL/hr  over 30 Minutes Intravenous  Once 03/28/14 1025 03/28/14 1115      Assessment/Plan: s/p Procedure(s): LAPAROSCOPIC CHOLECYSTECTOMY WITH INTRAOPERATIVE CHOLANGIOGRAM Doing well. Okay for discharge. Will followup with oral antibiotics at home for one week due to severe gangrenous cholecystitis .   LOS: 3 days    [name] 03/31/2014

## 2014-04-01 LAB — GLUCOSE, CAPILLARY: Glucose-Capillary: 122 mg/dL — ABNORMAL HIGH (ref 70–99)

## 2014-04-02 ENCOUNTER — Telehealth (INDEPENDENT_AMBULATORY_CARE_PROVIDER_SITE_OTHER): Payer: Self-pay | Admitting: General Surgery

## 2014-04-02 ENCOUNTER — Telehealth (INDEPENDENT_AMBULATORY_CARE_PROVIDER_SITE_OTHER): Payer: Self-pay

## 2014-04-02 DIAGNOSIS — R197 Diarrhea, unspecified: Secondary | ICD-10-CM | POA: Diagnosis not present

## 2014-04-02 DIAGNOSIS — R0602 Shortness of breath: Secondary | ICD-10-CM | POA: Diagnosis not present

## 2014-04-02 NOTE — Telephone Encounter (Signed)
Bryan Turner's wife called stating he was having more cough since surgery and wasn't feeling well.  Also having a burning pain in upper thigh.  Denies any lower extremity swelling.  Tolerating a diet.  Asked patient to call PCP to have cough evaluated.  Pt will call back with any further concerns.

## 2014-04-02 NOTE — Telephone Encounter (Signed)
Patient states husband cough has gotten worse ,she did get appointment with pcp today . Advised her to call if she had further concerns.

## 2014-04-02 NOTE — Telephone Encounter (Signed)
Pts wife called stating pt was going to see his pcp this morning as directed for pts lung concerns but had a loose stool and messed himself. Pt did not wait at pcp to see MD. She is concerned that he had a loose stool this morning early and one later in the morning. Pt has not had a BM since. No abd pain. No nausea just lack of appetite. Pt is tolerating liquids. No fever. She also states pt is coughing and now wheezing. I advised her that pt needs to see pcp today,urgent care or ER for lung issues. I stressed to her the most important issue at the moment is to be sure he does not have a lung infection or problem with his asthma. I explained that a loose stool once or twice a day may me expected after GB surgery. I advised her that if loose stools increase, fever,abd pain, vomiting occur we need to know this asap. She states she understands and will call pcp to go back to their office today for his cough, congestion and wheezing to be checked.

## 2014-04-03 DIAGNOSIS — R5383 Other fatigue: Secondary | ICD-10-CM | POA: Diagnosis not present

## 2014-04-03 DIAGNOSIS — E119 Type 2 diabetes mellitus without complications: Secondary | ICD-10-CM | POA: Diagnosis not present

## 2014-04-03 DIAGNOSIS — Z48815 Encounter for surgical aftercare following surgery on the digestive system: Secondary | ICD-10-CM | POA: Diagnosis not present

## 2014-04-03 DIAGNOSIS — H3530 Unspecified macular degeneration: Secondary | ICD-10-CM | POA: Diagnosis not present

## 2014-04-03 DIAGNOSIS — J209 Acute bronchitis, unspecified: Secondary | ICD-10-CM | POA: Diagnosis not present

## 2014-04-03 DIAGNOSIS — R5381 Other malaise: Secondary | ICD-10-CM | POA: Diagnosis not present

## 2014-04-03 DIAGNOSIS — IMO0001 Reserved for inherently not codable concepts without codable children: Secondary | ICD-10-CM | POA: Diagnosis not present

## 2014-04-04 ENCOUNTER — Ambulatory Visit: Payer: Medicare Other | Admitting: Podiatry

## 2014-04-04 DIAGNOSIS — E119 Type 2 diabetes mellitus without complications: Secondary | ICD-10-CM | POA: Diagnosis not present

## 2014-04-04 DIAGNOSIS — Z48815 Encounter for surgical aftercare following surgery on the digestive system: Secondary | ICD-10-CM | POA: Diagnosis not present

## 2014-04-04 DIAGNOSIS — R5381 Other malaise: Secondary | ICD-10-CM | POA: Diagnosis not present

## 2014-04-04 DIAGNOSIS — J209 Acute bronchitis, unspecified: Secondary | ICD-10-CM | POA: Diagnosis not present

## 2014-04-04 DIAGNOSIS — H3530 Unspecified macular degeneration: Secondary | ICD-10-CM | POA: Diagnosis not present

## 2014-04-04 DIAGNOSIS — IMO0001 Reserved for inherently not codable concepts without codable children: Secondary | ICD-10-CM | POA: Diagnosis not present

## 2014-04-04 NOTE — Discharge Summary (Signed)
Physician Discharge Summary  Patient ID: KAMARRION STFORT MRN: [id number] DOB/AGE: [birth year] 73 y.o.  Admit date: 03/28/2014 Discharge date: 04/04/2014  Admission Diagnoses:    Cholecystitis and cholelithiasis  AODM, on Metformin  Hx of tobacco use 20 years 1-1.5PPD, quit 1993.  Macular degeneration Multiple falls at home   Discharge Diagnoses:  Cholelithiasis and acute gangrenous cholecystitis AODM, on Metformin  Hx of tobacco use 20 years 1-1.5PPD, quit 1993.  Macular degeneration Multiple falls at home  Active Problems:   Cholecystitis   Cholelithiasis and acute cholecystitis without obstruction    PROCEDURES: LAPAROSCOPIC CHOLECYSTECTOMY WITH INTRAOPERATIVE CHOLANGIOGRAM, [name], MD, 03/28/14.    Hospital Course:  73 year old male with 3 days of right upper quadrant abdominal pain. It was constant 2 nights ago but yesterday seemed to be getting better.This morning it is back to severe. He describes as a 9/10 sharp pain. He's had nausea without any vomiting. He's been having some right low back pain as well. No hematuria or dysuria. He tried one tramadol with some success. No diarrhea or constipation. Had a normal bowel movement this morning. Has had no appetite during this time. No prior history of abdominal issues or surgery. HE was seen in the ED at [place] [facility]. Work up shows elevated WBC, normal lipase and LFT's glucose is up some. Abd US shows: Multiple mobile bile gallstones. Mild gallbladder wall thickening. No pericholecystic fluid. Patient was not tender over this regionduring scanning per ultrasound technologist. Common bile duct: Diameter: 6.6 mm. Distal aspect not visualized secondary to bowel gas.  She was transferred to [facility] and as a direct admission for possible surgery today. He was seen and taken to the OR that day.  He was found to have a acute gangrenous gallbladder.  He underwent cholecystectomy.  He was kept on IV antibiotics and slowly  mobilized.  His family reported multiple falls.  PT evaluated him and recommended SNF, and rolling walker.  He declined SNF.  He was willing to have home PT. He made slow progress and was ready for discharge on 03/31/14.  He was to follow up with PCP and our office as noted below.  He was sent on Augmentin at d/c.  Condition on d/c:  Improved    Disposition: 01-Home or Self Care  Discharge Instructions   Discharge patient    Complete by:  As directed             Medication List         amoxicillin-clavulanate 875-125 MG per tablet  Commonly known as:  AUGMENTIN  Take 1 tablet by mouth 2 (two) times daily.     FISH OIL + D3 PO  Take 1 capsule by mouth daily.     Lutein-Zeaxanthin 15-0.7 MG Caps  Take 1 capsule by mouth daily.     meperidine 50 MG tablet  Commonly known as:  DEMEROL  Take 0.5-1 tablets (25-50 mg total) by mouth every 4 (four) hours as needed for severe pain.     metFORMIN 500 MG tablet  Commonly known as:  GLUCOPHAGE  Take 500 mg by mouth 2 (two) times daily with a meal.     multivitamin Tabs tablet  Take 1 tablet by mouth daily.     saw palmetto 80 MG capsule  Take 80 mg by mouth daily.     traMADol 50 MG tablet  Commonly known as:  ULTRAM  Take 50 mg by mouth every 6 (six) hours as needed (pain).  traMADol 50 MG tablet  Commonly known as:  ULTRAM  Take 1 tablet (50 mg total) by mouth every 6 (six) hours as needed.     ZOSTAVAX 85631 UNT/0.65ML injection  Generic drug:  zoster vaccine live (PF)           Follow-up Information   Follow up with  Melinda Crutch, MD. Schedule an appointment as soon as possible for a visit in 1 week. (Call and make an appointment, to discuss your falling at home.)    Specialty:  Family Medicine   Contact information:   [address] [place] [address] [phone]       Follow up with [facility] On 04/16/2014. (1:45pm, arrive at 1:15pm for paperwork)    Contact information:   [address]   [place] [address] [phone]       Signed: Earnstine Regal 04/04/2014, 4:02 PM

## 2014-04-12 ENCOUNTER — Telehealth (INDEPENDENT_AMBULATORY_CARE_PROVIDER_SITE_OTHER): Payer: Self-pay

## 2014-04-12 DIAGNOSIS — E119 Type 2 diabetes mellitus without complications: Secondary | ICD-10-CM | POA: Diagnosis not present

## 2014-04-12 DIAGNOSIS — J209 Acute bronchitis, unspecified: Secondary | ICD-10-CM | POA: Diagnosis not present

## 2014-04-12 DIAGNOSIS — Z48815 Encounter for surgical aftercare following surgery on the digestive system: Secondary | ICD-10-CM | POA: Diagnosis not present

## 2014-04-12 DIAGNOSIS — R5383 Other fatigue: Secondary | ICD-10-CM | POA: Diagnosis not present

## 2014-04-12 DIAGNOSIS — H3530 Unspecified macular degeneration: Secondary | ICD-10-CM | POA: Diagnosis not present

## 2014-04-12 DIAGNOSIS — IMO0001 Reserved for inherently not codable concepts without codable children: Secondary | ICD-10-CM | POA: Diagnosis not present

## 2014-04-12 DIAGNOSIS — R5381 Other malaise: Secondary | ICD-10-CM | POA: Diagnosis not present

## 2014-04-12 NOTE — Telephone Encounter (Signed)
Benjamine Mola, Physical Therapist is calling today to let us know that pt states that he is feeling much better and he doesn't feel that he needs PT any longer.  She Pt has a po appt 04/16/14. Informed Benjamine Mola that I would send Dr Excell Seltzer a message and let him know.

## 2014-04-15 ENCOUNTER — Ambulatory Visit: Payer: PRIVATE HEALTH INSURANCE

## 2014-04-16 ENCOUNTER — Encounter (INDEPENDENT_AMBULATORY_CARE_PROVIDER_SITE_OTHER): Payer: Self-pay | Admitting: General Surgery

## 2014-04-16 ENCOUNTER — Ambulatory Visit (INDEPENDENT_AMBULATORY_CARE_PROVIDER_SITE_OTHER): Payer: Medicare Other | Admitting: General Surgery

## 2014-04-16 VITALS — BP 122/80 | HR 73 | Temp 97.8°F | Resp 16 | Ht 72.0 in | Wt 200.8 lb

## 2014-04-16 DIAGNOSIS — K8066 Calculus of gallbladder and bile duct with acute and chronic cholecystitis without obstruction: Secondary | ICD-10-CM

## 2014-04-16 NOTE — Patient Instructions (Signed)
Call us if we can help.  Congratulations on your weight loss, and diabetes control.

## 2014-04-16 NOTE — Progress Notes (Signed)
Bryan Turner [facility] [birth year] [id number] 04/16/2014   SAHEED Turner is a 73 y.o. male who had a laparoscopic cholecystectomy with intraoperative cholangiogram by Dr. Excell Seltzer.  The pathology report confirmed Gallbladder - ACUTE AND CHRONIC CHOLECYSTITIS AND CHOLELITHIASIS. Marland Kitchen  The patient reports that they are feeling well with normal bowel movements and good appetite.  The pre-operative symptoms of abdominal pain, nausea, and vomiting have resolved.    Physical examination - BP 122/80  Pulse 73  Temp(Src) 97.8 F (36.6 C)  Resp 16  Ht 6' (1.829 m)  Wt 91.082 kg (200 lb 12.8 oz)  BMI 27.23 kg/m2   Incisions appear well-healed with no sign of infection or bleeding.   Abdomen - soft, non-tender  Impression:  s/p laparoscopic cholecystectomy  Plan:  He may resume a regular diet and full activity.  He may follow-up on a PRN basis.  Cholecystitis and cholelithiasis  AODM, on Metformin  Hx of tobacco use 20 years 1-1.5PPD, quit 1993.  Macular degeneration  Multiple falls at home

## 2014-05-03 DIAGNOSIS — Z23 Encounter for immunization: Secondary | ICD-10-CM | POA: Diagnosis not present

## 2014-05-08 DIAGNOSIS — M79609 Pain in unspecified limb: Secondary | ICD-10-CM | POA: Diagnosis not present

## 2014-06-19 ENCOUNTER — Encounter: Payer: Self-pay | Admitting: Podiatry

## 2014-06-19 ENCOUNTER — Ambulatory Visit (INDEPENDENT_AMBULATORY_CARE_PROVIDER_SITE_OTHER): Payer: Medicare Other | Admitting: Podiatry

## 2014-06-19 VITALS — BP 125/78 | HR 74 | Resp 17

## 2014-06-19 DIAGNOSIS — M722 Plantar fascial fibromatosis: Secondary | ICD-10-CM

## 2014-06-19 MED ORDER — TRAMADOL HCL 50 MG PO TABS: 50.0000 mg | ORAL_TABLET | Freq: Three times a day (TID) | ORAL | Status: DC

## 2014-06-19 MED ORDER — TRIAMCINOLONE ACETONIDE 10 MG/ML IJ SUSP
10.0000 mg | Freq: Once | INTRAMUSCULAR | Status: AC
  Administered 2014-06-19: 10 mg

## 2014-06-19 NOTE — Progress Notes (Signed)
   Subjective:    Patient ID: Bryan Turner, male    DOB: 01/27/1941, 73 y.o.   MRN: [id number]  HPI  Pt presents with right foot pain, needs injection, got relief from last injection.  Review of Systems     Objective:   Physical Exam        Assessment & Plan:

## 2014-06-19 NOTE — Progress Notes (Signed)
Subjective:     Patient ID: Bryan Turner, male   DOB: [birth year], 73 y.o.   MRN: [id number]  HPI patient presents stating my heel has started to hurt again and it's bad when I get up in the morning or after sitting. I had a my gall bladder out and I'm not been as active as I was and I've lost 35 pounds   Review of Systems     Objective:   Physical Exam Neurovascular status intact with muscle strength adequate and range of motion subtalar midtarsal joint within normal limits. Patient's plantar heel right is very tender in the medial band at the insertion into the calcaneus    Assessment:     Plantar fasciitis right with inflammation and pain especially present first up in the morning    Plan:     Reviewed condition and reinjected the plantar fascia 3 mg Kenalog 5 mg I can Marcaine mixture and dispensed night splint with instructions on usage

## 2014-07-09 DIAGNOSIS — Z23 Encounter for immunization: Secondary | ICD-10-CM | POA: Diagnosis not present

## 2014-07-17 DIAGNOSIS — J329 Chronic sinusitis, unspecified: Secondary | ICD-10-CM | POA: Diagnosis not present

## 2014-08-22 DIAGNOSIS — E119 Type 2 diabetes mellitus without complications: Secondary | ICD-10-CM | POA: Diagnosis not present

## 2014-08-28 DIAGNOSIS — E119 Type 2 diabetes mellitus without complications: Secondary | ICD-10-CM | POA: Diagnosis not present

## 2014-10-24 ENCOUNTER — Ambulatory Visit: Payer: Medicare Other | Admitting: Podiatry

## 2014-12-28 IMAGING — CR DG FOOT COMPLETE 3+V*R*
3 series · 3 of 3 positions shown · non-contrast
Comparison: None

CLINICAL DATA: 72-year-old male with right foot pain.

RIGHT FOOT COMPLETE - 3+ VIEW

[t foot ap right]
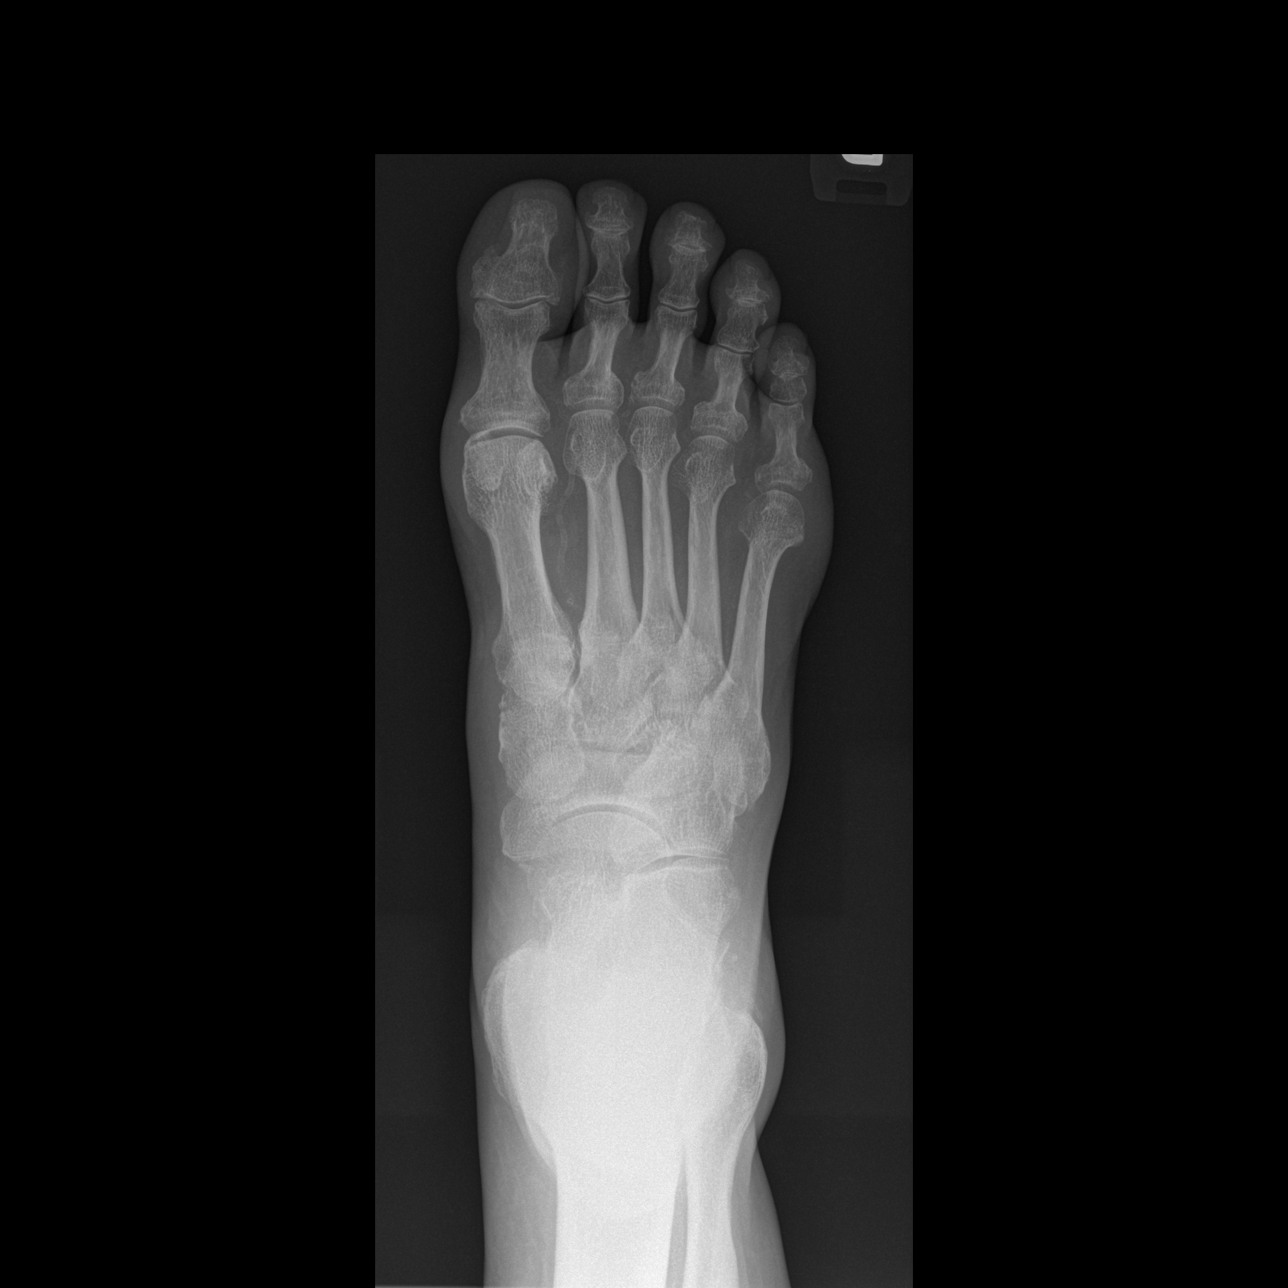

[t foot oblique right]
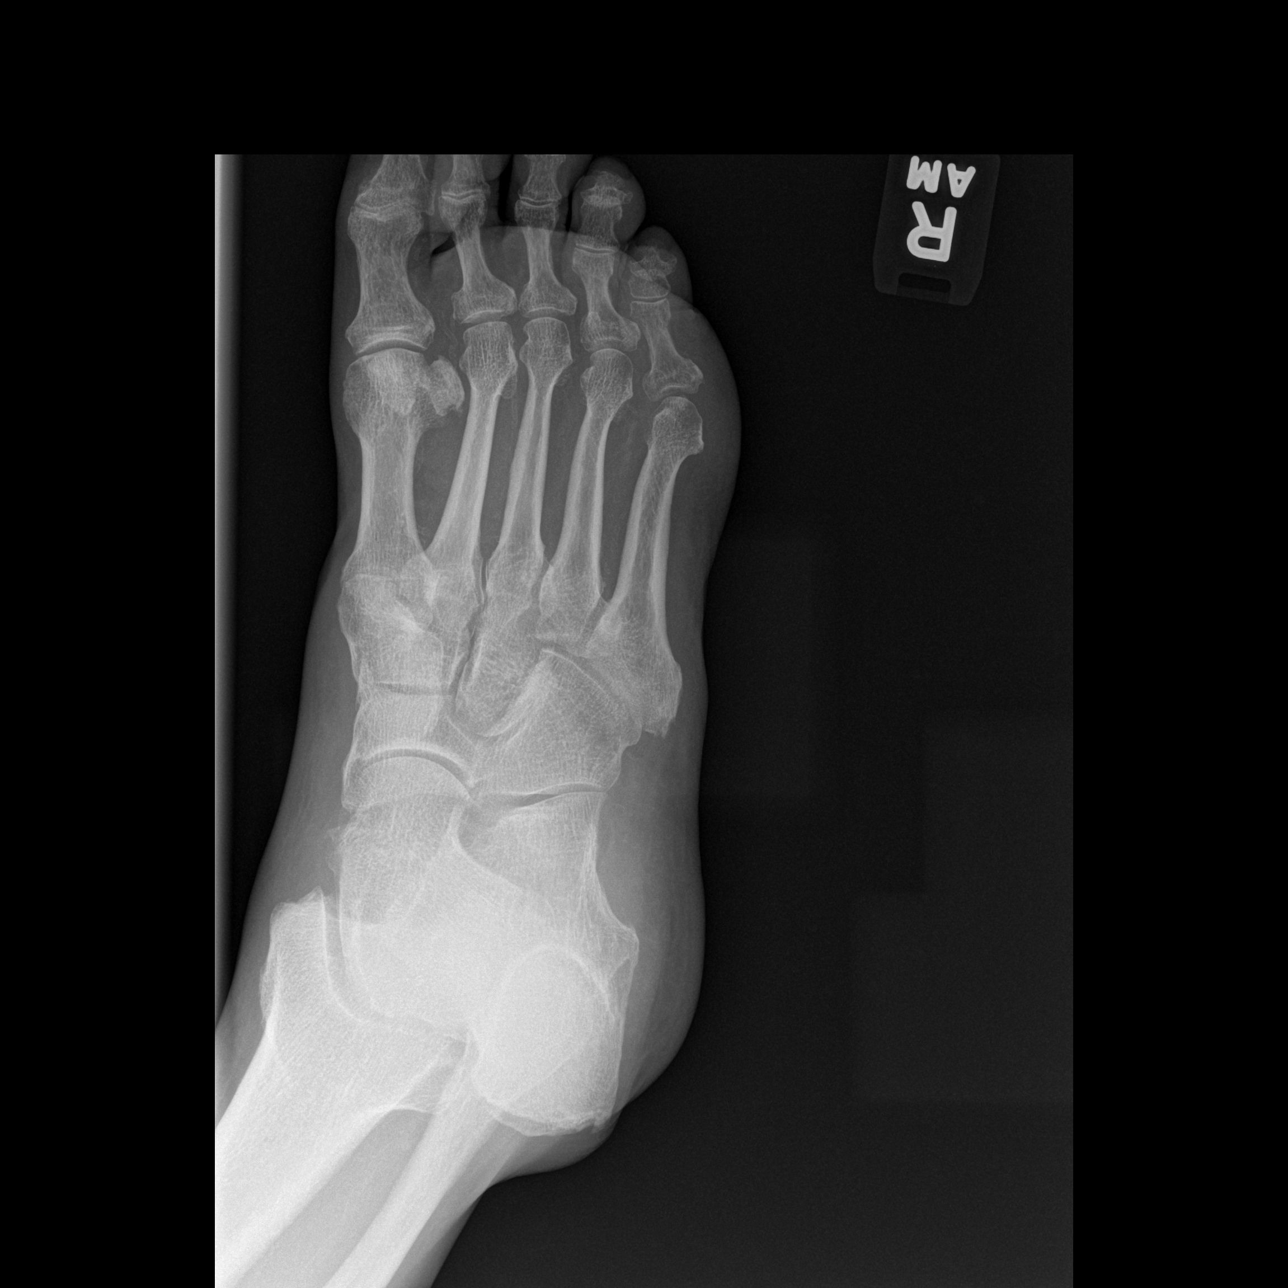

[t foot lat right]
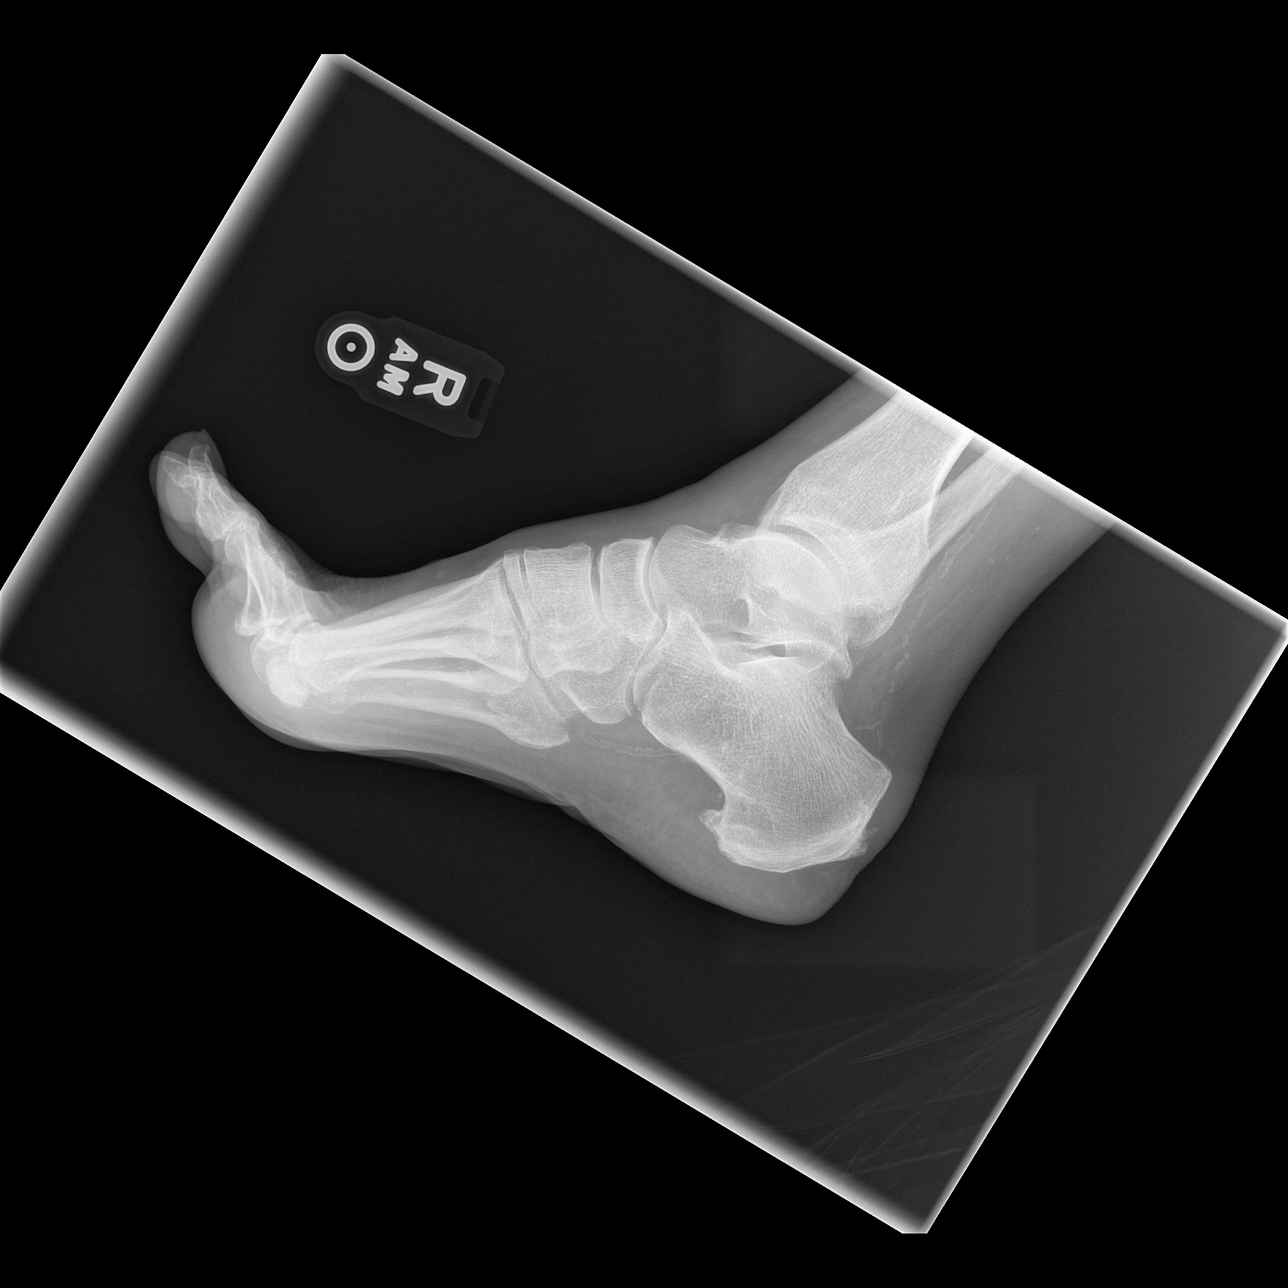

[3 of 3 positions shown; findings below may reference images not displayed]

FINDINGS: There is no evidence of fracture, subluxation or
dislocation.
The Lisfranc joints are intact.
No focal bony lesions are present.
Vascular calcifications are identified.
A small calcaneal spur is noted.
IMPRESSION: No evidence of acute abnormality.

Calcaneal spur.

## 2015-01-17 DIAGNOSIS — H2511 Age-related nuclear cataract, right eye: Secondary | ICD-10-CM | POA: Diagnosis not present

## 2015-01-17 DIAGNOSIS — E119 Type 2 diabetes mellitus without complications: Secondary | ICD-10-CM | POA: Diagnosis not present

## 2015-01-17 DIAGNOSIS — H3531 Nonexudative age-related macular degeneration: Secondary | ICD-10-CM | POA: Diagnosis not present

## 2015-01-24 DIAGNOSIS — Z961 Presence of intraocular lens: Secondary | ICD-10-CM | POA: Diagnosis not present

## 2015-01-24 DIAGNOSIS — H02834 Dermatochalasis of left upper eyelid: Secondary | ICD-10-CM | POA: Diagnosis not present

## 2015-01-24 DIAGNOSIS — H3531 Nonexudative age-related macular degeneration: Secondary | ICD-10-CM | POA: Diagnosis not present

## 2015-01-24 DIAGNOSIS — H02831 Dermatochalasis of right upper eyelid: Secondary | ICD-10-CM | POA: Diagnosis not present

## 2015-01-24 DIAGNOSIS — H25811 Combined forms of age-related cataract, right eye: Secondary | ICD-10-CM | POA: Diagnosis not present

## 2015-02-17 DIAGNOSIS — H269 Unspecified cataract: Secondary | ICD-10-CM | POA: Diagnosis not present

## 2015-02-17 DIAGNOSIS — H2511 Age-related nuclear cataract, right eye: Secondary | ICD-10-CM | POA: Diagnosis not present

## 2015-03-07 DIAGNOSIS — E119 Type 2 diabetes mellitus without complications: Secondary | ICD-10-CM | POA: Diagnosis not present

## 2015-03-11 DIAGNOSIS — Z0001 Encounter for general adult medical examination with abnormal findings: Secondary | ICD-10-CM | POA: Diagnosis not present

## 2015-03-11 DIAGNOSIS — E119 Type 2 diabetes mellitus without complications: Secondary | ICD-10-CM | POA: Diagnosis not present

## 2015-03-11 DIAGNOSIS — Z23 Encounter for immunization: Secondary | ICD-10-CM | POA: Diagnosis not present

## 2015-03-11 DIAGNOSIS — L570 Actinic keratosis: Secondary | ICD-10-CM | POA: Diagnosis not present

## 2015-05-12 ENCOUNTER — Ambulatory Visit (INDEPENDENT_AMBULATORY_CARE_PROVIDER_SITE_OTHER): Payer: Medicare Other

## 2015-05-12 ENCOUNTER — Ambulatory Visit (INDEPENDENT_AMBULATORY_CARE_PROVIDER_SITE_OTHER): Payer: Medicare Other | Admitting: Podiatry

## 2015-05-12 DIAGNOSIS — R52 Pain, unspecified: Secondary | ICD-10-CM

## 2015-05-12 DIAGNOSIS — M722 Plantar fascial fibromatosis: Secondary | ICD-10-CM

## 2015-05-12 MED ORDER — TRIAMCINOLONE ACETONIDE 10 MG/ML IJ SUSP
10.0000 mg | Freq: Once | INTRAMUSCULAR | Status: AC
  Administered 2015-05-12: 10 mg

## 2015-05-12 NOTE — Progress Notes (Signed)
   Subjective:    Patient ID: Bryan Turner, male    DOB: [birth year], 74 y.o.   MRN: [id number]  HPI  Patient presents here today with right heel pain that has returned  Since 2-3 weeks ago and has gotten worse.he is wearing the plantar brace and may need another one.  Review of Systems  All other systems reviewed and are negative.      Objective:   Physical Exam        Assessment & Plan:

## 2015-05-12 NOTE — Progress Notes (Signed)
Subjective:     Patient ID: Bryan Turner, male   DOB: 10-Aug-1941, 74 y.o.   MRN: [id number]  HPI patient states my right heel has started to bother me again and it is quite sore when palpated   Review of Systems     Objective:   Physical Exam Neurovascular status intact with discomfort in the right plantar heel that still present but improved from previous visit and has been over a year since he's been seen    Assessment:     Plantar fasciitis right    Plan:     Re-x-rayed and reviewed and injected the plantar fascia 3 mg Kenalog 5 mg Xylocaine and dispensed a fascial brace with instructions on usage

## 2015-06-18 DIAGNOSIS — J329 Chronic sinusitis, unspecified: Secondary | ICD-10-CM | POA: Diagnosis not present

## 2015-06-18 DIAGNOSIS — R05 Cough: Secondary | ICD-10-CM | POA: Diagnosis not present

## 2015-06-18 DIAGNOSIS — M79673 Pain in unspecified foot: Secondary | ICD-10-CM | POA: Diagnosis not present

## 2015-06-23 DIAGNOSIS — E119 Type 2 diabetes mellitus without complications: Secondary | ICD-10-CM | POA: Diagnosis not present

## 2015-06-24 DIAGNOSIS — H66001 Acute suppurative otitis media without spontaneous rupture of ear drum, right ear: Secondary | ICD-10-CM | POA: Diagnosis not present

## 2015-08-25 DIAGNOSIS — Z23 Encounter for immunization: Secondary | ICD-10-CM | POA: Diagnosis not present

## 2015-08-25 DIAGNOSIS — E119 Type 2 diabetes mellitus without complications: Secondary | ICD-10-CM | POA: Diagnosis not present

## 2015-09-15 DIAGNOSIS — E119 Type 2 diabetes mellitus without complications: Secondary | ICD-10-CM | POA: Diagnosis not present

## 2016-01-22 DIAGNOSIS — H26491 Other secondary cataract, right eye: Secondary | ICD-10-CM | POA: Diagnosis not present

## 2016-02-20 ENCOUNTER — Encounter: Payer: Self-pay | Admitting: Podiatry

## 2016-02-20 ENCOUNTER — Ambulatory Visit (INDEPENDENT_AMBULATORY_CARE_PROVIDER_SITE_OTHER): Payer: Medicare Other | Admitting: Podiatry

## 2016-02-20 VITALS — BP 155/69 | HR 60 | Resp 12

## 2016-02-20 DIAGNOSIS — M722 Plantar fascial fibromatosis: Secondary | ICD-10-CM | POA: Diagnosis not present

## 2016-02-20 MED ORDER — TRAMADOL HCL 50 MG PO TABS: 50.0000 mg | ORAL_TABLET | Freq: Three times a day (TID) | ORAL | Status: DC

## 2016-02-20 MED ORDER — TRIAMCINOLONE ACETONIDE 10 MG/ML IJ SUSP
10.0000 mg | Freq: Once | INTRAMUSCULAR | Status: AC
  Administered 2016-02-20: 10 mg

## 2016-02-22 NOTE — Progress Notes (Signed)
Subjective:     Patient ID: Bryan Turner, male   DOB: [birth year], 75 y.o.   MRN: [id number]  HPI patient presents stating I'm still having pain in my right heel and it did very well for a long time and has come back   Review of Systems     Objective:   Physical Exam Neurovascular status intact muscle strength adequate with exquisite discomfort plantar heel right at the insertional point tendon into the calcaneus    Assessment:     Plantar fasciitis right with inflammation of the tendon at insertion    Plan:     Inject the plantar fascia right 3 mg Kenalog 5 mg Xylocaine and applied fascial brace with instructions on usage along with his therapy

## 2016-03-17 DIAGNOSIS — E119 Type 2 diabetes mellitus without complications: Secondary | ICD-10-CM | POA: Diagnosis not present

## 2016-03-17 DIAGNOSIS — Z125 Encounter for screening for malignant neoplasm of prostate: Secondary | ICD-10-CM | POA: Diagnosis not present

## 2016-03-22 DIAGNOSIS — Z7984 Long term (current) use of oral hypoglycemic drugs: Secondary | ICD-10-CM | POA: Diagnosis not present

## 2016-03-22 DIAGNOSIS — Z0000 Encounter for general adult medical examination without abnormal findings: Secondary | ICD-10-CM | POA: Diagnosis not present

## 2016-03-22 DIAGNOSIS — E1140 Type 2 diabetes mellitus with diabetic neuropathy, unspecified: Secondary | ICD-10-CM | POA: Diagnosis not present

## 2016-07-26 DIAGNOSIS — E119 Type 2 diabetes mellitus without complications: Secondary | ICD-10-CM | POA: Diagnosis not present

## 2016-07-26 DIAGNOSIS — H353132 Nonexudative age-related macular degeneration, bilateral, intermediate dry stage: Secondary | ICD-10-CM | POA: Diagnosis not present

## 2016-08-16 DIAGNOSIS — Z23 Encounter for immunization: Secondary | ICD-10-CM | POA: Diagnosis not present

## 2016-11-02 DIAGNOSIS — E113291 Type 2 diabetes mellitus with mild nonproliferative diabetic retinopathy without macular edema, right eye: Secondary | ICD-10-CM | POA: Diagnosis not present

## 2016-11-02 DIAGNOSIS — Z0100 Encounter for examination of eyes and vision without abnormal findings: Secondary | ICD-10-CM | POA: Diagnosis not present

## 2016-11-02 DIAGNOSIS — E113292 Type 2 diabetes mellitus with mild nonproliferative diabetic retinopathy without macular edema, left eye: Secondary | ICD-10-CM | POA: Diagnosis not present

## 2016-11-09 DIAGNOSIS — J069 Acute upper respiratory infection, unspecified: Secondary | ICD-10-CM | POA: Diagnosis not present

## 2017-01-01 DIAGNOSIS — H66011 Acute suppurative otitis media with spontaneous rupture of ear drum, right ear: Secondary | ICD-10-CM | POA: Diagnosis not present

## 2017-01-01 DIAGNOSIS — H72821 Total perforations of tympanic membrane, right ear: Secondary | ICD-10-CM | POA: Diagnosis not present

## 2017-01-04 DIAGNOSIS — H66001 Acute suppurative otitis media without spontaneous rupture of ear drum, right ear: Secondary | ICD-10-CM | POA: Diagnosis not present

## 2017-01-14 ENCOUNTER — Ambulatory Visit: Payer: Medicare Other

## 2017-01-14 ENCOUNTER — Telehealth: Payer: Self-pay | Admitting: *Deleted

## 2017-01-14 ENCOUNTER — Ambulatory Visit (INDEPENDENT_AMBULATORY_CARE_PROVIDER_SITE_OTHER): Payer: Medicare Other | Admitting: Podiatry

## 2017-01-14 ENCOUNTER — Encounter: Payer: Self-pay | Admitting: Podiatry

## 2017-01-14 DIAGNOSIS — M722 Plantar fascial fibromatosis: Secondary | ICD-10-CM

## 2017-01-14 MED ORDER — TRAMADOL HCL 50 MG PO TABS: 50.0000 mg | ORAL_TABLET | Freq: Four times a day (QID) | ORAL | 0 refills | Status: DC | PRN

## 2017-01-14 MED ORDER — TRIAMCINOLONE ACETONIDE 10 MG/ML IJ SUSP: 10.0000 mg | Freq: Once | INTRAMUSCULAR | Status: DC

## 2017-01-14 NOTE — Telephone Encounter (Signed)
Pr's wife, [name] states Dr. Paulla Dolly was going to call in Tramadol and it is not at the pharmacy. I did not see the orders in Medications or in today clinicals. I asked Dr. Jacqualyn Posey if he would order the Tramadol for the pt. Dr. Jacqualyn Posey ordered Tramadol 50mg  #28 one tablet every 6 hours. Orders called to [place].

## 2017-01-16 NOTE — Progress Notes (Signed)
Subjective:     Patient ID: Bryan Turner, male   DOB: 1941/09/14, 76 y.o.   MRN: [id number]  HPI patient presents stating my heel has started to really bother me a can and my brace is no longer affective as it help me before but I cannot wear any more due to the fact that it's broken down over this long period of time   Review of Systems     Objective:   Physical Exam Neurovascular status intact negative Homans sign was noted with patient's right foot showing quite a bit of discomfort in the medial band at the insertional point tendon calcaneus    Assessment:     Plantar fasciitis right with inflammation fluid buildup    Plan:     H&P and today I injected the plantar fascial right 3 mg Kenalog 5 mg Xylocaine and applied fascial brace

## 2017-02-15 DIAGNOSIS — M179 Osteoarthritis of knee, unspecified: Secondary | ICD-10-CM | POA: Diagnosis not present

## 2017-02-15 DIAGNOSIS — J302 Other seasonal allergic rhinitis: Secondary | ICD-10-CM | POA: Diagnosis not present

## 2017-02-18 ENCOUNTER — Emergency Department (HOSPITAL_BASED_OUTPATIENT_CLINIC_OR_DEPARTMENT_OTHER): Payer: Medicare Other

## 2017-02-18 ENCOUNTER — Encounter (HOSPITAL_BASED_OUTPATIENT_CLINIC_OR_DEPARTMENT_OTHER): Payer: Self-pay

## 2017-02-18 ENCOUNTER — Emergency Department (HOSPITAL_BASED_OUTPATIENT_CLINIC_OR_DEPARTMENT_OTHER)
Admission: EM | Admit: 2017-02-18 | Discharge: 2017-02-18 | Disposition: A | Discharge status: Home / Self Care | Payer: Medicare Other | Attending: Emergency Medicine | Admitting: Emergency Medicine

## 2017-02-18 DIAGNOSIS — R51 Headache: Secondary | ICD-10-CM | POA: Diagnosis not present

## 2017-02-18 DIAGNOSIS — Z87891 Personal history of nicotine dependence: Secondary | ICD-10-CM | POA: Diagnosis not present

## 2017-02-18 DIAGNOSIS — J45909 Unspecified asthma, uncomplicated: Secondary | ICD-10-CM | POA: Diagnosis not present

## 2017-02-18 DIAGNOSIS — M5481 Occipital neuralgia: Secondary | ICD-10-CM | POA: Diagnosis not present

## 2017-02-18 DIAGNOSIS — E119 Type 2 diabetes mellitus without complications: Secondary | ICD-10-CM | POA: Diagnosis not present

## 2017-02-18 HISTORY — DX: Plantar fascial fibromatosis: M72.2

## 2017-02-18 HISTORY — DX: Unspecified hearing loss, unspecified ear: H91.90

## 2017-02-18 LAB — CBC WITH DIFFERENTIAL/PLATELET
BASOS ABS: 0.1 10*3/uL (ref 0.0–0.1)
Basophils Relative: 1 %
Eosinophils Absolute: 0.4 10*3/uL (ref 0.0–0.7)
Eosinophils Relative: 6 %
HCT: 39.4 % (ref 39.0–52.0)
Hemoglobin: 13.2 g/dL (ref 13.0–17.0)
Lymphocytes Relative: 32 %
Lymphs Abs: 2.4 10*3/uL (ref 0.7–4.0)
MCH: 31.4 pg (ref 26.0–34.0)
MCHC: 33.5 g/dL (ref 30.0–36.0)
MCV: 93.6 fL (ref 78.0–100.0)
Monocytes Absolute: 0.9 10*3/uL (ref 0.1–1.0)
Monocytes Relative: 11 %
Neutro Abs: 3.8 10*3/uL (ref 1.7–7.7)
Neutrophils Relative %: 50 %
Platelets: 202 10*3/uL (ref 150–400)
RBC: 4.21 MIL/uL — ABNORMAL LOW (ref 4.22–5.81)
RDW: 13.5 % (ref 11.5–15.5)
WBC: 7.5 10*3/uL (ref 4.0–10.5)

## 2017-02-18 LAB — BASIC METABOLIC PANEL
Anion gap: 7 (ref 5–15)
BUN: 28 mg/dL — AB (ref 6–20)
CO2: 26 mmol/L (ref 22–32)
Calcium: 9.3 mg/dL (ref 8.9–10.3)
Chloride: 106 mmol/L (ref 101–111)
Creatinine, Ser: 1.34 mg/dL — ABNORMAL HIGH (ref 0.61–1.24)
GFR calc Af Amer: 58 mL/min — ABNORMAL LOW (ref >60)
GFR calc non Af Amer: 50 mL/min — ABNORMAL LOW (ref >60)
Glucose, Bld: 121 mg/dL — ABNORMAL HIGH (ref 65–99)
POTASSIUM: 4.9 mmol/L (ref 3.5–5.1)
Sodium: 139 mmol/L (ref 135–145)

## 2017-02-18 MED ORDER — CARBAMAZEPINE 200 MG PO TABS: 100.0000 mg | ORAL_TABLET | Freq: Two times a day (BID) | ORAL | 0 refills | Status: DC

## 2017-02-18 MED ORDER — HYDROCODONE-ACETAMINOPHEN 5-325 MG PO TABS: 1.0000 | ORAL_TABLET | Freq: Four times a day (QID) | ORAL | 0 refills | Status: DC | PRN

## 2017-02-18 NOTE — ED Notes (Signed)
Pt given Rx x 2 for tegretol and hydrocodone at discharge. Ambulatory with cane. Family present

## 2017-02-18 NOTE — ED Triage Notes (Signed)
Pt c/o left side parietal pain for the last several days that is not relieved by sinus medications.  Pt c/o sharp pain every 60 seconds on left side behind left ear.  Pt denies weakness, denies numbness.

## 2017-02-18 NOTE — Discharge Instructions (Signed)
Tegretol as prescribed.  Hydrocodone as prescribed as needed for pain.  Follow-up with your primary doctor in 1 week for a recheck, and return to the ER if your symptoms significantly worsen or change in the meantime.

## 2017-02-18 NOTE — ED Provider Notes (Signed)
[facility] Provider Note   CSN: [id number] Arrival date & time: 02/18/17  0604     History   Chief Complaint Chief Complaint  Patient presents with  . Headache    HPI Bryan Turner is a 76 y.o. male.  Patient is a 76 year old male with past medical history of diabetes, asthma, hearing loss. He presents for evaluation of head pain. He reports a 5 day history of sharp, intermittent pains behind and above his left ear. He reports a sharp, stabbing pain that occurs approximately once every 60 seconds. He saw his primary doctor who gave him medication for possible sinus problems. He denies any fevers, chills, or congestion.   The history is provided by the patient.  Headache   This is a new problem. Episode onset: 5 days ago. The problem occurs every few minutes. The problem has been gradually worsening. The headache is associated with nothing. The pain is located in the left unilateral region. The pain is at a severity of 8/10. The pain is moderate. The pain does not radiate. Pertinent negatives include no fever, no shortness of breath, no nausea and no vomiting. He has tried acetaminophen for the symptoms. The treatment provided mild relief.    Past Medical History:  Diagnosis Date  . Asthma   . Diabetes mellitus without complication ([place])   . Hard of hearing    wears hearing aides  . Macular degeneration   . Plantar fasciitis     Patient Active Problem List   Diagnosis Date Noted  . Cholecystitis 03/28/2014  . Cholelithiasis and acute cholecystitis without obstruction 03/28/2014    Past Surgical History:  Procedure Laterality Date  . CHOLECYSTECTOMY N/A 03/28/2014   Procedure: LAPAROSCOPIC CHOLECYSTECTOMY WITH INTRAOPERATIVE CHOLANGIOGRAM;  Surgeon: [name], MD;  Location: [facility];  Service: General;  Laterality: N/A;  . EYE SURGERY    . HEMORRHOID SURGERY         Home Medications    Prior to Admission medications   Medication Sig Start  Date End Date Taking? Authorizing Provider  Fish Oil-Cholecalciferol (FISH OIL + D3 PO) Take 1 capsule by mouth daily.    Historical Provider, MD  Lutein-Zeaxanthin 15-0.7 MG CAPS Take 1 capsule by mouth daily.    Historical Provider, MD  metFORMIN (GLUCOPHAGE) 500 MG tablet Take 500 mg by mouth 2 (two) times daily. 04/18/15   Historical Provider, MD  Multiple Vitamins-Minerals (PRESERVISION AREDS 2 PO) Take 25 mg by mouth. 2 capsules daily    Historical Provider, MD  multivitamin (ONE-A-DAY MEN'S) TABS tablet Take 1 tablet by mouth daily.    Historical Provider, MD  PROAIR HFA 108 (90 BASE) MCG/ACT inhaler As needed 04/02/14   Historical Provider, MD  saw palmetto 80 MG capsule Take 80 mg by mouth daily.    Historical Provider, MD  traMADol (ULTRAM) 50 MG tablet Take 1 tablet (50 mg total) by mouth every 6 (six) hours as needed. 11/24/13   Hope Bunnie Pion, NP  traMADol (ULTRAM) 50 MG tablet Take 1 tablet (50 mg total) by mouth 3 (three) times daily. 06/19/14   Wallene Huh, DPM  traMADol (ULTRAM) 50 MG tablet Take 1 tablet (50 mg total) by mouth every 6 (six) hours as needed. 01/14/17   Trula Slade, DPM    Family History No family history on file.  Social History Social History  Substance Use Topics  . Smoking status: Former Smoker    Quit date: 06/16/1992  . Smokeless tobacco:  Never Used  . Alcohol use No     Allergies   Aspirin; Codeine; Flagyl [metronidazole]; Prednisone; and Tamiflu [oseltamivir]   Review of Systems Review of Systems  Constitutional: Negative for fever.  Respiratory: Negative for shortness of breath.   Gastrointestinal: Negative for nausea and vomiting.  Neurological: Positive for headaches.  All other systems reviewed and are negative.    Physical Exam Updated Vital Signs BP (!) 146/95 (BP Location: Right Arm)   Pulse 66   Temp 97.6 F (36.4 C) (Oral)   Resp 18   Ht 6' (1.829 m)   Wt 210 lb (95.3 kg)   SpO2 98%   BMI 28.48 kg/m   Physical Exam    Constitutional: He is oriented to person, place, and time. He appears well-developed and well-nourished. No distress.  HENT:  Head: Normocephalic and atraumatic.  Mouth/Throat: Oropharynx is clear and moist.  The right TM has a chronic perforation. The left TM is clear.  Eyes: EOM are normal. Pupils are equal, round, and reactive to light.  Neck: Normal range of motion. Neck supple.  Cardiovascular: Normal rate and regular rhythm.  Exam reveals no friction rub.   No murmur heard. Pulmonary/Chest: Effort normal and breath sounds normal. No respiratory distress. He has no wheezes. He has no rales.  Abdominal: Soft. Bowel sounds are normal. He exhibits no distension. There is no tenderness.  Musculoskeletal: Normal range of motion. He exhibits no edema.  Neurological: He is alert and oriented to person, place, and time. No cranial nerve deficit. He exhibits normal muscle tone. Coordination normal.  Skin: Skin is warm and dry. He is not diaphoretic.  Nursing note and vitals reviewed.    ED Treatments / Results  Labs (all labs ordered are listed, but only abnormal results are displayed) Labs Reviewed  BASIC METABOLIC PANEL  CBC WITH DIFFERENTIAL/PLATELET    EKG  EKG Interpretation None       Radiology No results found.  Procedures Procedures (including critical care time)  Medications Ordered in ED Medications - No data to display   Initial Impression / Assessment and Plan / ED Course  I have reviewed the triage vital signs and the nursing notes.  Pertinent labs & imaging results that were available during my care of the patient were reviewed by me and considered in my medical decision making (see chart for details).  Patient presents with intermittent sharp pains behind and over his left ear for the past 5 days. His symptoms sound like occipital or trigeminal neuralgia. My plan is to treat with Tegretol, pain medication, and follow-up with his primary doctor. He is  neurologically intact and head CT is negative. I see no indication for further workup at this time. He will follow-up with his primary doctor to gauge response to his medications and determine whether referrals or further workup is indicated at that time.  Final Clinical Impressions(s) / ED Diagnoses   Final diagnoses:  None    New Prescriptions New Prescriptions   No medications on file     Veryl Speak, MD 02/18/17 [phone]

## 2017-03-01 DIAGNOSIS — M5481 Occipital neuralgia: Secondary | ICD-10-CM | POA: Diagnosis not present

## 2017-03-01 DIAGNOSIS — E7800 Pure hypercholesterolemia, unspecified: Secondary | ICD-10-CM | POA: Diagnosis not present

## 2017-03-01 DIAGNOSIS — Z7984 Long term (current) use of oral hypoglycemic drugs: Secondary | ICD-10-CM | POA: Diagnosis not present

## 2017-03-01 DIAGNOSIS — E1140 Type 2 diabetes mellitus with diabetic neuropathy, unspecified: Secondary | ICD-10-CM | POA: Diagnosis not present

## 2017-03-01 DIAGNOSIS — R7309 Other abnormal glucose: Secondary | ICD-10-CM | POA: Diagnosis not present

## 2017-03-01 DIAGNOSIS — Z79899 Other long term (current) drug therapy: Secondary | ICD-10-CM | POA: Diagnosis not present

## 2017-03-07 DIAGNOSIS — L218 Other seborrheic dermatitis: Secondary | ICD-10-CM | POA: Diagnosis not present

## 2017-03-07 DIAGNOSIS — D485 Neoplasm of uncertain behavior of skin: Secondary | ICD-10-CM | POA: Diagnosis not present

## 2017-03-07 DIAGNOSIS — C44319 Basal cell carcinoma of skin of other parts of face: Secondary | ICD-10-CM | POA: Diagnosis not present

## 2017-03-17 DIAGNOSIS — M5481 Occipital neuralgia: Secondary | ICD-10-CM | POA: Diagnosis not present

## 2017-03-17 DIAGNOSIS — E7800 Pure hypercholesterolemia, unspecified: Secondary | ICD-10-CM | POA: Diagnosis not present

## 2017-03-17 DIAGNOSIS — E1140 Type 2 diabetes mellitus with diabetic neuropathy, unspecified: Secondary | ICD-10-CM | POA: Diagnosis not present

## 2017-03-17 DIAGNOSIS — R899 Unspecified abnormal finding in specimens from other organs, systems and tissues: Secondary | ICD-10-CM | POA: Diagnosis not present

## 2017-03-17 DIAGNOSIS — Z79899 Other long term (current) drug therapy: Secondary | ICD-10-CM | POA: Diagnosis not present

## 2017-03-17 DIAGNOSIS — Z7984 Long term (current) use of oral hypoglycemic drugs: Secondary | ICD-10-CM | POA: Diagnosis not present

## 2017-03-21 DIAGNOSIS — C44319 Basal cell carcinoma of skin of other parts of face: Secondary | ICD-10-CM | POA: Diagnosis not present

## 2017-03-21 DIAGNOSIS — Z85828 Personal history of other malignant neoplasm of skin: Secondary | ICD-10-CM | POA: Diagnosis not present

## 2017-03-23 DIAGNOSIS — R899 Unspecified abnormal finding in specimens from other organs, systems and tissues: Secondary | ICD-10-CM | POA: Diagnosis not present

## 2017-03-23 DIAGNOSIS — E7800 Pure hypercholesterolemia, unspecified: Secondary | ICD-10-CM | POA: Diagnosis not present

## 2017-03-23 DIAGNOSIS — E1140 Type 2 diabetes mellitus with diabetic neuropathy, unspecified: Secondary | ICD-10-CM | POA: Diagnosis not present

## 2017-03-23 DIAGNOSIS — Z7984 Long term (current) use of oral hypoglycemic drugs: Secondary | ICD-10-CM | POA: Diagnosis not present

## 2017-03-23 DIAGNOSIS — Z0000 Encounter for general adult medical examination without abnormal findings: Secondary | ICD-10-CM | POA: Diagnosis not present

## 2017-03-23 DIAGNOSIS — M79673 Pain in unspecified foot: Secondary | ICD-10-CM | POA: Diagnosis not present

## 2017-04-27 DIAGNOSIS — Z7984 Long term (current) use of oral hypoglycemic drugs: Secondary | ICD-10-CM | POA: Diagnosis not present

## 2017-04-27 DIAGNOSIS — Z0000 Encounter for general adult medical examination without abnormal findings: Secondary | ICD-10-CM | POA: Diagnosis not present

## 2017-04-27 DIAGNOSIS — M79673 Pain in unspecified foot: Secondary | ICD-10-CM | POA: Diagnosis not present

## 2017-04-27 DIAGNOSIS — E1140 Type 2 diabetes mellitus with diabetic neuropathy, unspecified: Secondary | ICD-10-CM | POA: Diagnosis not present

## 2017-04-27 DIAGNOSIS — E7800 Pure hypercholesterolemia, unspecified: Secondary | ICD-10-CM | POA: Diagnosis not present

## 2017-04-27 DIAGNOSIS — R899 Unspecified abnormal finding in specimens from other organs, systems and tissues: Secondary | ICD-10-CM | POA: Diagnosis not present

## 2017-05-02 DIAGNOSIS — E119 Type 2 diabetes mellitus without complications: Secondary | ICD-10-CM | POA: Diagnosis not present

## 2017-05-02 DIAGNOSIS — Z961 Presence of intraocular lens: Secondary | ICD-10-CM | POA: Diagnosis not present

## 2017-05-02 DIAGNOSIS — H5211 Myopia, right eye: Secondary | ICD-10-CM | POA: Diagnosis not present

## 2017-05-02 DIAGNOSIS — H26491 Other secondary cataract, right eye: Secondary | ICD-10-CM | POA: Diagnosis not present

## 2017-07-29 DIAGNOSIS — H6691 Otitis media, unspecified, right ear: Secondary | ICD-10-CM | POA: Diagnosis not present

## 2017-07-29 DIAGNOSIS — H7291 Unspecified perforation of tympanic membrane, right ear: Secondary | ICD-10-CM | POA: Diagnosis not present

## 2017-08-18 DIAGNOSIS — Z23 Encounter for immunization: Secondary | ICD-10-CM | POA: Diagnosis not present

## 2017-09-27 DIAGNOSIS — Z85828 Personal history of other malignant neoplasm of skin: Secondary | ICD-10-CM | POA: Diagnosis not present

## 2018-03-27 DIAGNOSIS — Z7984 Long term (current) use of oral hypoglycemic drugs: Secondary | ICD-10-CM | POA: Diagnosis not present

## 2018-03-27 DIAGNOSIS — E7800 Pure hypercholesterolemia, unspecified: Secondary | ICD-10-CM | POA: Diagnosis not present

## 2018-03-27 DIAGNOSIS — E1140 Type 2 diabetes mellitus with diabetic neuropathy, unspecified: Secondary | ICD-10-CM | POA: Diagnosis not present

## 2018-03-31 DIAGNOSIS — Z7984 Long term (current) use of oral hypoglycemic drugs: Secondary | ICD-10-CM | POA: Diagnosis not present

## 2018-03-31 DIAGNOSIS — M179 Osteoarthritis of knee, unspecified: Secondary | ICD-10-CM | POA: Diagnosis not present

## 2018-03-31 DIAGNOSIS — E7800 Pure hypercholesterolemia, unspecified: Secondary | ICD-10-CM | POA: Diagnosis not present

## 2018-03-31 DIAGNOSIS — E1140 Type 2 diabetes mellitus with diabetic neuropathy, unspecified: Secondary | ICD-10-CM | POA: Diagnosis not present

## 2018-03-31 DIAGNOSIS — Z0000 Encounter for general adult medical examination without abnormal findings: Secondary | ICD-10-CM | POA: Diagnosis not present

## 2018-04-05 ENCOUNTER — Encounter: Payer: Self-pay | Admitting: Podiatry

## 2018-04-05 ENCOUNTER — Ambulatory Visit (INDEPENDENT_AMBULATORY_CARE_PROVIDER_SITE_OTHER): Payer: Medicare Other | Admitting: Podiatry

## 2018-04-05 DIAGNOSIS — M722 Plantar fascial fibromatosis: Secondary | ICD-10-CM

## 2018-04-05 MED ORDER — TRIAMCINOLONE ACETONIDE 10 MG/ML IJ SUSP
10.0000 mg | Freq: Once | INTRAMUSCULAR | Status: AC
  Administered 2018-04-05: 10 mg

## 2018-04-05 NOTE — Progress Notes (Signed)
Subjective:   Patient ID: Bryan Turner, male   DOB: 77 y.o.   MRN: [id number]   HPI Patient presents stating the bottom of my right heel has started to hurt again and my brace is no longer providing the support   ROS      Objective:  Physical Exam  Neurovascular status intact with exquisite discomfort plantar aspect right heel at the insertional point of the tendon into the calcaneus     Assessment:  Acute plantar fasciitis right with inflammation fluid buildup     Plan:  H&P condition reviewed and went ahead today and injected the right plantar fascia 3 mg Kenalog 5 mg Xylocaine and dispense fascial brace for usage on a daily basis.  Discussed shoe gear modifications

## 2018-06-05 DIAGNOSIS — H26491 Other secondary cataract, right eye: Secondary | ICD-10-CM | POA: Diagnosis not present

## 2018-06-05 DIAGNOSIS — H353132 Nonexudative age-related macular degeneration, bilateral, intermediate dry stage: Secondary | ICD-10-CM | POA: Diagnosis not present

## 2018-06-05 DIAGNOSIS — E119 Type 2 diabetes mellitus without complications: Secondary | ICD-10-CM | POA: Diagnosis not present

## 2018-06-05 DIAGNOSIS — H524 Presbyopia: Secondary | ICD-10-CM | POA: Diagnosis not present

## 2018-07-12 DIAGNOSIS — H26491 Other secondary cataract, right eye: Secondary | ICD-10-CM | POA: Diagnosis not present

## 2018-07-24 DIAGNOSIS — Z23 Encounter for immunization: Secondary | ICD-10-CM | POA: Diagnosis not present

## 2018-08-15 DIAGNOSIS — Z85828 Personal history of other malignant neoplasm of skin: Secondary | ICD-10-CM | POA: Diagnosis not present

## 2018-08-15 DIAGNOSIS — D692 Other nonthrombocytopenic purpura: Secondary | ICD-10-CM | POA: Diagnosis not present

## 2018-08-15 DIAGNOSIS — L570 Actinic keratosis: Secondary | ICD-10-CM | POA: Diagnosis not present

## 2018-08-15 DIAGNOSIS — D1801 Hemangioma of skin and subcutaneous tissue: Secondary | ICD-10-CM | POA: Diagnosis not present

## 2018-08-15 DIAGNOSIS — L218 Other seborrheic dermatitis: Secondary | ICD-10-CM | POA: Diagnosis not present

## 2018-08-15 DIAGNOSIS — L821 Other seborrheic keratosis: Secondary | ICD-10-CM | POA: Diagnosis not present

## 2018-09-01 IMAGING — CT CT HEAD W/O CM
3 series · 15 of 47 positions shown, 18 images · non-contrast
Comparison: 08/13/2012 CT of the head.

CLINICAL DATA: 75 y/o  M; left parietal headache for several days.

EXAM:
CT HEAD WITHOUT CONTRAST
TECHNIQUE: Contiguous axial images were obtained from the base of the skull
through the vertex without intravenous contrast.

[Series 2: head wo · axial · 0.46mm/px · z∈[-179,-19]mm · 9 of 38 slices shown, 12 images]
[im 3/38  brain]
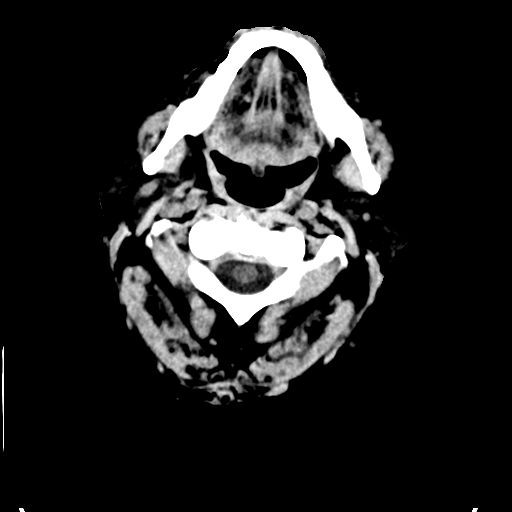
[im 3/38  bone]
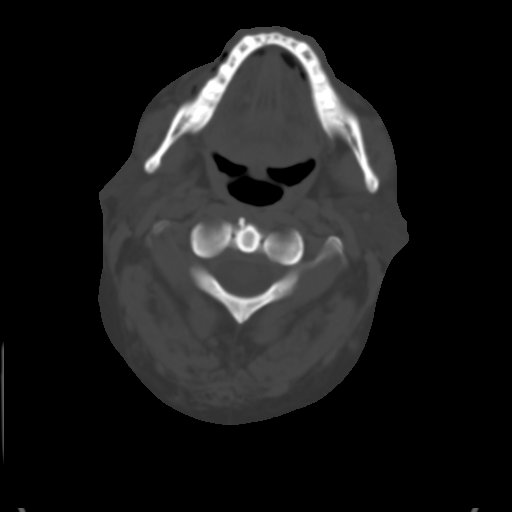
[im 7/38  brain]
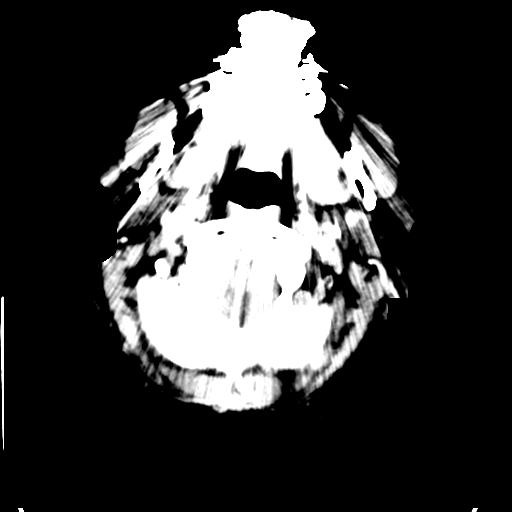
[im 11/38  brain]
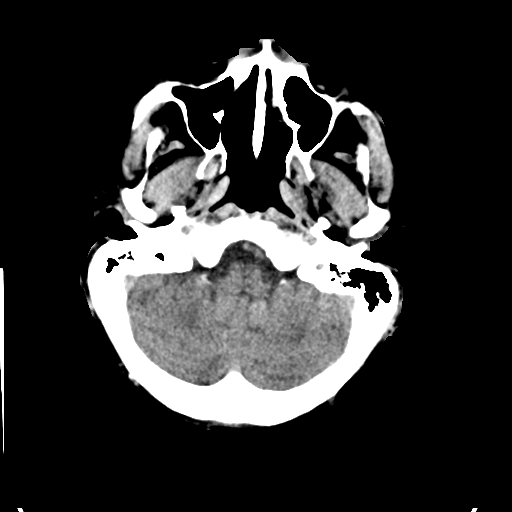
[im 15/38  brain]
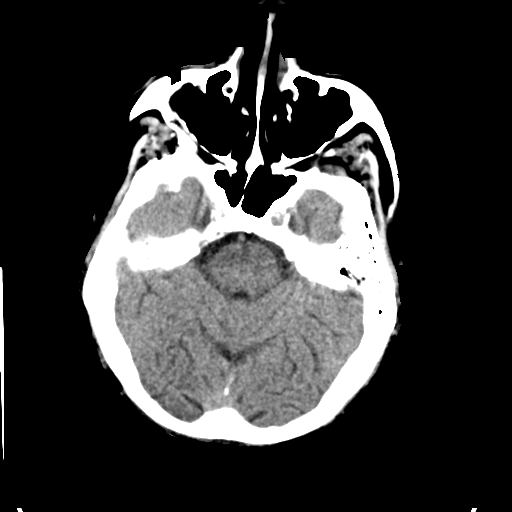
[im 20/38  brain]
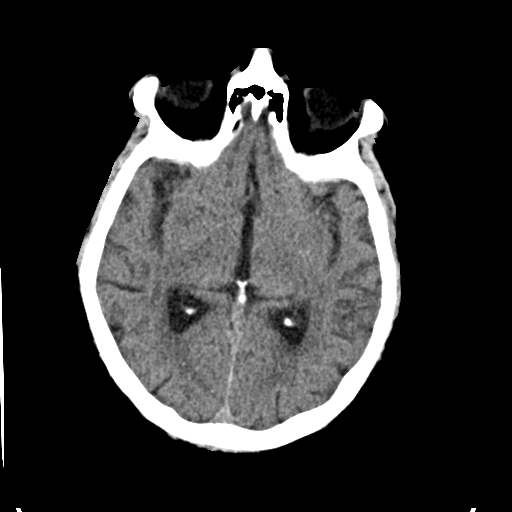
[im 20/38  bone]
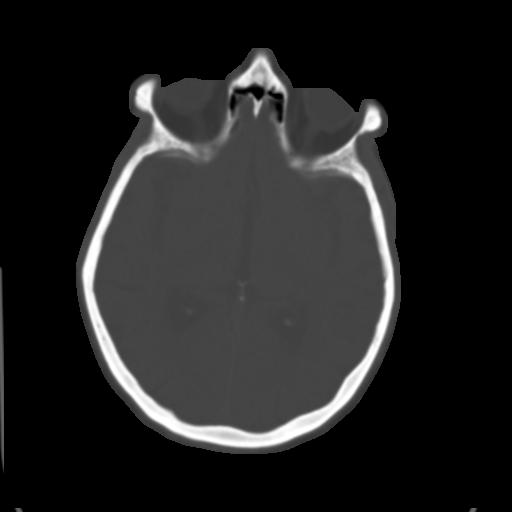
[im 23/38  brain]
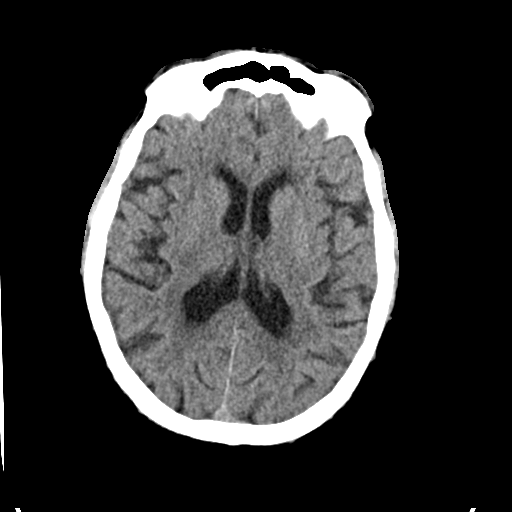
[im 27/38  brain]
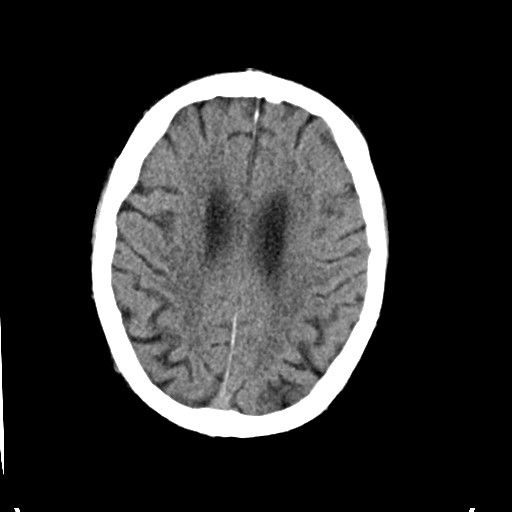
[im 31/38  brain]
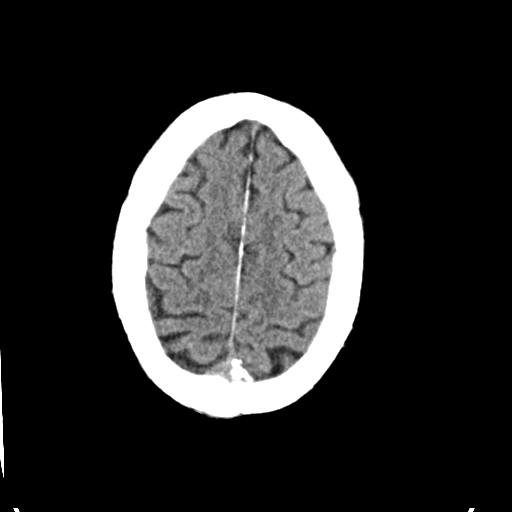
[im 35/38  brain]
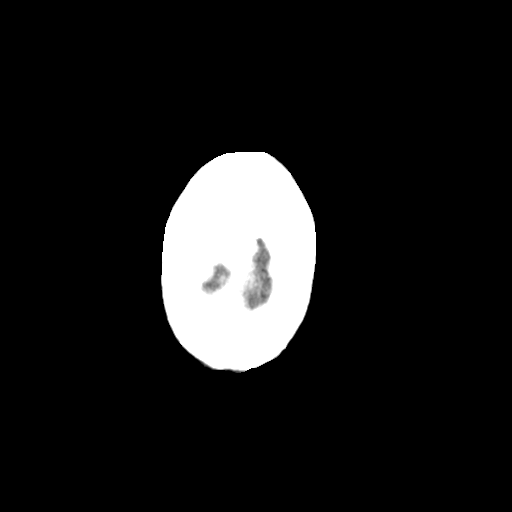
[im 35/38  bone]
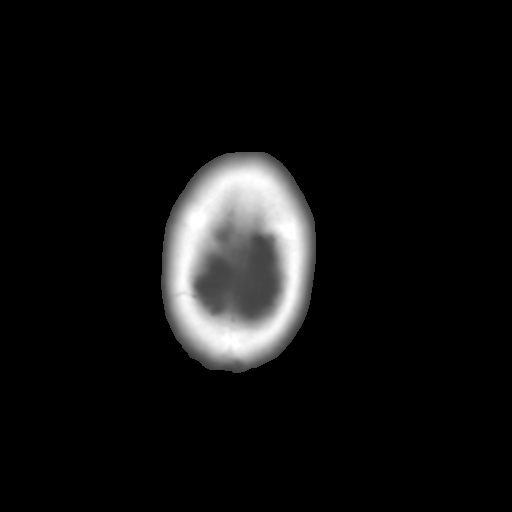

[Series 4: coronal soft · coronal · 0.38mm/px · 3 of 71 slices shown]
[im 24/71  brain]
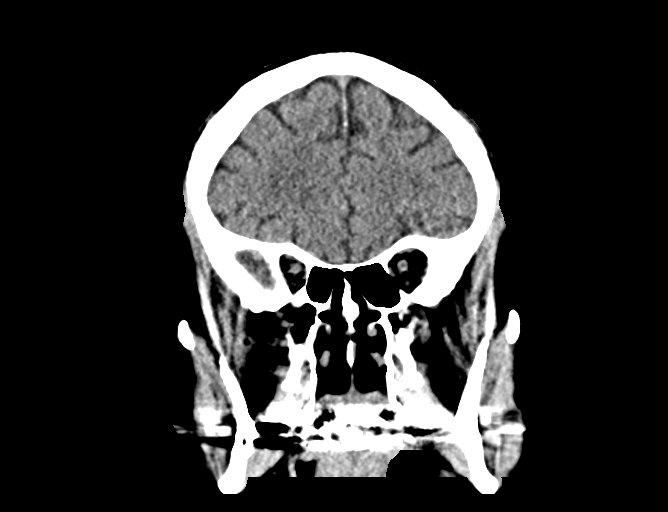
[im 32/71  brain]
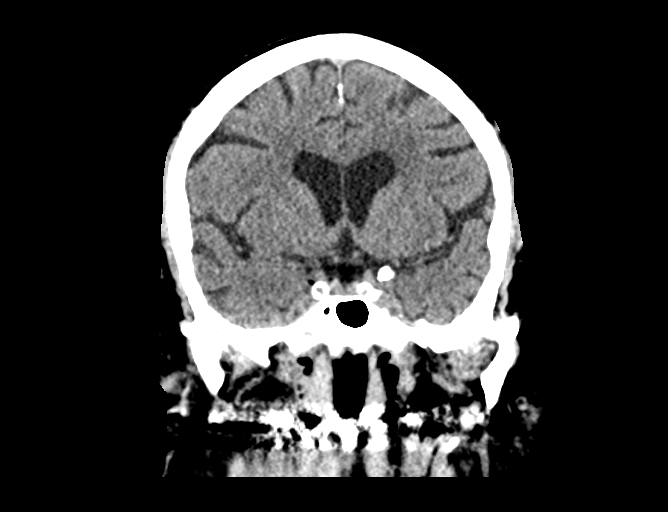
[im 39/71  brain]
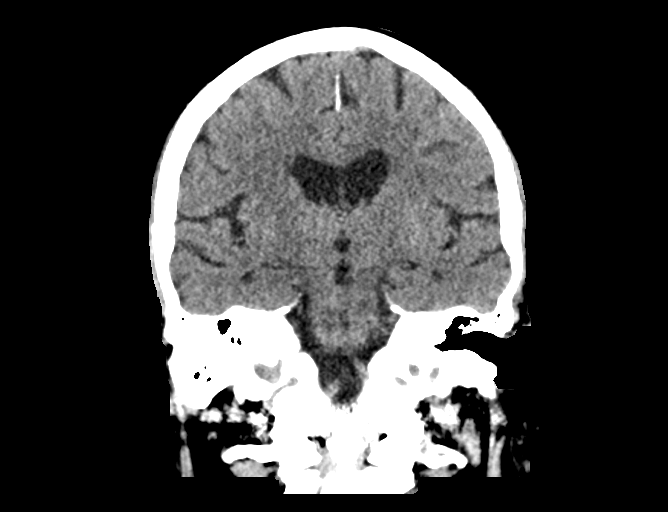

[Series 5: sag soft · sagittal · 0.39mm/px · 3 of 56 slices shown]
[im 19/56  brain]
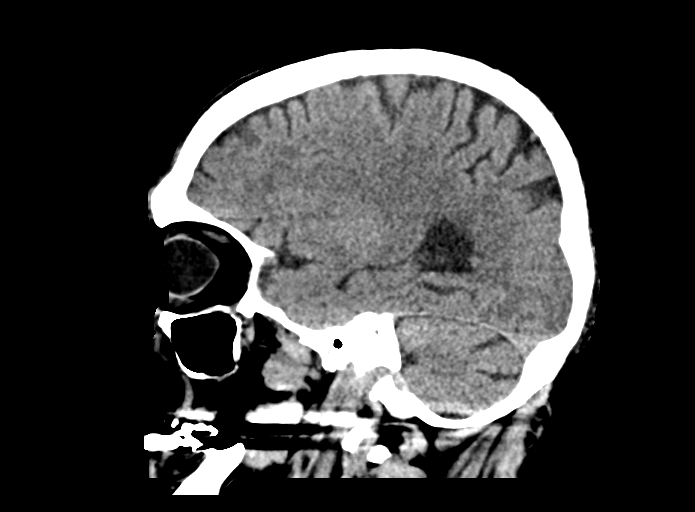
[im 28/56  brain]
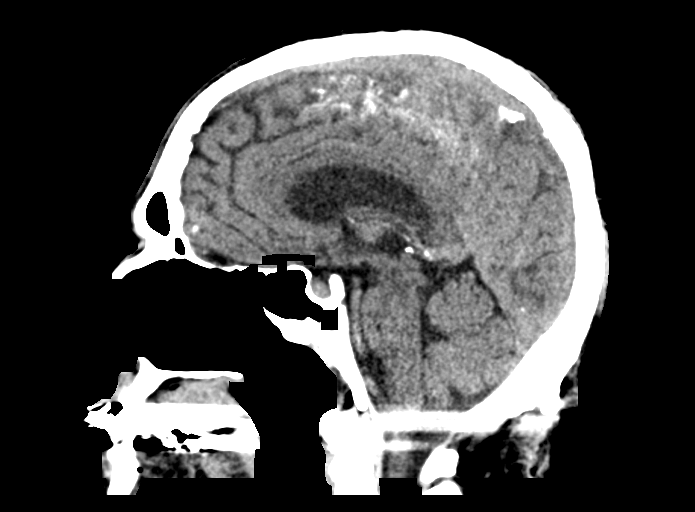
[im 37/56  brain]
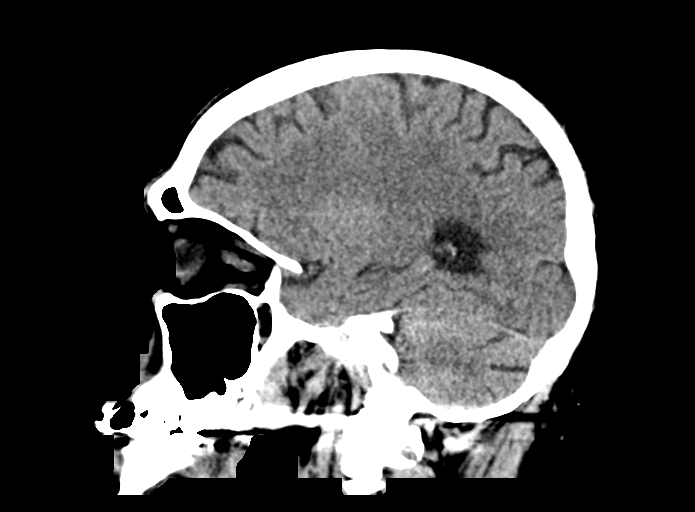

[15 of 47 positions shown; findings below may reference images not displayed]

FINDINGS: Brain: No evidence of acute infarction, hemorrhage, hydrocephalus,
extra-axial collection or mass lesion/mass effect. Stable foci of
hypoattenuation in subcortical and periventricular white matter
compatible with mild chronic microvascular ischemic changes for age.
Mild brain parenchymal volume loss.

Vascular: Extensive calcific atherosclerosis of cavernous and
paraclinoid internal carotid arteries.

Skull: Normal. Negative for fracture or focal lesion.

Sinuses/Orbits: No acute finding.

Other: Bilateral intra-ocular lens replacement.
IMPRESSION: 1. No acute intracranial abnormality identified.
2. Stable mild for age chronic microvascular ischemic changes and
mild parenchymal volume loss of the brain.

By: Yobis Orte M.D.

## 2018-09-19 DIAGNOSIS — E7800 Pure hypercholesterolemia, unspecified: Secondary | ICD-10-CM | POA: Diagnosis not present

## 2018-09-19 DIAGNOSIS — Z0000 Encounter for general adult medical examination without abnormal findings: Secondary | ICD-10-CM | POA: Diagnosis not present

## 2018-09-19 DIAGNOSIS — M179 Osteoarthritis of knee, unspecified: Secondary | ICD-10-CM | POA: Diagnosis not present

## 2018-09-19 DIAGNOSIS — E1140 Type 2 diabetes mellitus with diabetic neuropathy, unspecified: Secondary | ICD-10-CM | POA: Diagnosis not present

## 2018-09-25 DIAGNOSIS — M179 Osteoarthritis of knee, unspecified: Secondary | ICD-10-CM | POA: Diagnosis not present

## 2018-09-25 DIAGNOSIS — J302 Other seasonal allergic rhinitis: Secondary | ICD-10-CM | POA: Diagnosis not present

## 2018-09-25 DIAGNOSIS — E1140 Type 2 diabetes mellitus with diabetic neuropathy, unspecified: Secondary | ICD-10-CM | POA: Diagnosis not present

## 2019-04-27 DIAGNOSIS — Z0000 Encounter for general adult medical examination without abnormal findings: Secondary | ICD-10-CM | POA: Diagnosis not present

## 2019-04-27 DIAGNOSIS — M179 Osteoarthritis of knee, unspecified: Secondary | ICD-10-CM | POA: Diagnosis not present

## 2019-04-27 DIAGNOSIS — E1140 Type 2 diabetes mellitus with diabetic neuropathy, unspecified: Secondary | ICD-10-CM | POA: Diagnosis not present

## 2019-04-27 DIAGNOSIS — E7800 Pure hypercholesterolemia, unspecified: Secondary | ICD-10-CM | POA: Diagnosis not present

## 2019-05-03 DIAGNOSIS — N183 Chronic kidney disease, stage 3 (moderate): Secondary | ICD-10-CM | POA: Diagnosis not present

## 2019-05-03 DIAGNOSIS — E7800 Pure hypercholesterolemia, unspecified: Secondary | ICD-10-CM | POA: Diagnosis not present

## 2019-05-03 DIAGNOSIS — Z0000 Encounter for general adult medical examination without abnormal findings: Secondary | ICD-10-CM | POA: Diagnosis not present

## 2019-05-03 DIAGNOSIS — E1140 Type 2 diabetes mellitus with diabetic neuropathy, unspecified: Secondary | ICD-10-CM | POA: Diagnosis not present

## 2019-05-03 DIAGNOSIS — M179 Osteoarthritis of knee, unspecified: Secondary | ICD-10-CM | POA: Diagnosis not present

## 2019-06-25 DIAGNOSIS — H7291 Unspecified perforation of tympanic membrane, right ear: Secondary | ICD-10-CM | POA: Diagnosis not present

## 2019-06-25 DIAGNOSIS — H6691 Otitis media, unspecified, right ear: Secondary | ICD-10-CM | POA: Diagnosis not present

## 2019-07-30 DIAGNOSIS — E119 Type 2 diabetes mellitus without complications: Secondary | ICD-10-CM | POA: Diagnosis not present

## 2019-07-30 DIAGNOSIS — H524 Presbyopia: Secondary | ICD-10-CM | POA: Diagnosis not present

## 2019-08-07 DIAGNOSIS — Z23 Encounter for immunization: Secondary | ICD-10-CM | POA: Diagnosis not present

## 2019-08-23 ENCOUNTER — Encounter (HOSPITAL_COMMUNITY): Payer: Self-pay | Admitting: Emergency Medicine

## 2019-08-23 ENCOUNTER — Other Ambulatory Visit: Payer: Self-pay

## 2019-08-23 ENCOUNTER — Emergency Department (HOSPITAL_COMMUNITY): Payer: Medicare Other

## 2019-08-23 DIAGNOSIS — R0603 Acute respiratory distress: Secondary | ICD-10-CM | POA: Diagnosis present

## 2019-08-23 DIAGNOSIS — I959 Hypotension, unspecified: Secondary | ICD-10-CM | POA: Diagnosis present

## 2019-08-23 DIAGNOSIS — J690 Pneumonitis due to inhalation of food and vomit: Secondary | ICD-10-CM | POA: Diagnosis present

## 2019-08-23 DIAGNOSIS — I499 Cardiac arrhythmia, unspecified: Secondary | ICD-10-CM | POA: Diagnosis not present

## 2019-08-23 DIAGNOSIS — R0602 Shortness of breath: Secondary | ICD-10-CM | POA: Diagnosis not present

## 2019-08-23 DIAGNOSIS — J45901 Unspecified asthma with (acute) exacerbation: Secondary | ICD-10-CM | POA: Diagnosis present

## 2019-08-23 DIAGNOSIS — D631 Anemia in chronic kidney disease: Secondary | ICD-10-CM | POA: Diagnosis present

## 2019-08-23 DIAGNOSIS — E872 Acidosis: Secondary | ICD-10-CM | POA: Diagnosis not present

## 2019-08-23 DIAGNOSIS — Z885 Allergy status to narcotic agent status: Secondary | ICD-10-CM

## 2019-08-23 DIAGNOSIS — I491 Atrial premature depolarization: Secondary | ICD-10-CM | POA: Diagnosis not present

## 2019-08-23 DIAGNOSIS — H9190 Unspecified hearing loss, unspecified ear: Secondary | ICD-10-CM

## 2019-08-23 DIAGNOSIS — I4510 Unspecified right bundle-branch block: Secondary | ICD-10-CM | POA: Diagnosis not present

## 2019-08-23 DIAGNOSIS — J189 Pneumonia, unspecified organism: Secondary | ICD-10-CM

## 2019-08-23 DIAGNOSIS — Z09 Encounter for follow-up examination after completed treatment for conditions other than malignant neoplasm: Secondary | ICD-10-CM

## 2019-08-23 DIAGNOSIS — D72829 Elevated white blood cell count, unspecified: Secondary | ICD-10-CM | POA: Diagnosis not present

## 2019-08-23 DIAGNOSIS — Z9049 Acquired absence of other specified parts of digestive tract: Secondary | ICD-10-CM

## 2019-08-23 DIAGNOSIS — Z20828 Contact with and (suspected) exposure to other viral communicable diseases: Secondary | ICD-10-CM | POA: Diagnosis not present

## 2019-08-23 DIAGNOSIS — I214 Non-ST elevation (NSTEMI) myocardial infarction: Secondary | ICD-10-CM | POA: Diagnosis present

## 2019-08-23 DIAGNOSIS — Z751 Person awaiting admission to adequate facility elsewhere: Secondary | ICD-10-CM

## 2019-08-23 DIAGNOSIS — Z66 Do not resuscitate: Secondary | ICD-10-CM | POA: Diagnosis present

## 2019-08-23 DIAGNOSIS — Z87891 Personal history of nicotine dependence: Secondary | ICD-10-CM

## 2019-08-23 DIAGNOSIS — I700 Atherosclerosis of aorta: Secondary | ICD-10-CM | POA: Diagnosis present

## 2019-08-23 DIAGNOSIS — Z209 Contact with and (suspected) exposure to unspecified communicable disease: Secondary | ICD-10-CM | POA: Diagnosis not present

## 2019-08-23 DIAGNOSIS — I252 Old myocardial infarction: Secondary | ICD-10-CM

## 2019-08-23 DIAGNOSIS — R1313 Dysphagia, pharyngeal phase: Secondary | ICD-10-CM | POA: Diagnosis present

## 2019-08-23 DIAGNOSIS — Z79899 Other long term (current) drug therapy: Secondary | ICD-10-CM

## 2019-08-23 DIAGNOSIS — I083 Combined rheumatic disorders of mitral, aortic and tricuspid valves: Secondary | ICD-10-CM | POA: Diagnosis present

## 2019-08-23 DIAGNOSIS — E119 Type 2 diabetes mellitus without complications: Secondary | ICD-10-CM | POA: Diagnosis not present

## 2019-08-23 DIAGNOSIS — E1165 Type 2 diabetes mellitus with hyperglycemia: Secondary | ICD-10-CM | POA: Diagnosis not present

## 2019-08-23 DIAGNOSIS — I480 Paroxysmal atrial fibrillation: Secondary | ICD-10-CM | POA: Diagnosis not present

## 2019-08-23 DIAGNOSIS — I2510 Atherosclerotic heart disease of native coronary artery without angina pectoris: Secondary | ICD-10-CM | POA: Diagnosis present

## 2019-08-23 DIAGNOSIS — Z515 Encounter for palliative care: Secondary | ICD-10-CM | POA: Diagnosis not present

## 2019-08-23 DIAGNOSIS — E1122 Type 2 diabetes mellitus with diabetic chronic kidney disease: Secondary | ICD-10-CM | POA: Diagnosis present

## 2019-08-23 DIAGNOSIS — Z881 Allergy status to other antibiotic agents status: Secondary | ICD-10-CM

## 2019-08-23 DIAGNOSIS — J9601 Acute respiratory failure with hypoxia: Secondary | ICD-10-CM | POA: Diagnosis present

## 2019-08-23 DIAGNOSIS — Z886 Allergy status to analgesic agent status: Secondary | ICD-10-CM

## 2019-08-23 DIAGNOSIS — I4891 Unspecified atrial fibrillation: Secondary | ICD-10-CM | POA: Diagnosis not present

## 2019-08-23 DIAGNOSIS — I5043 Acute on chronic combined systolic (congestive) and diastolic (congestive) heart failure: Secondary | ICD-10-CM | POA: Diagnosis not present

## 2019-08-23 DIAGNOSIS — Z79891 Long term (current) use of opiate analgesic: Secondary | ICD-10-CM

## 2019-08-23 DIAGNOSIS — E875 Hyperkalemia: Secondary | ICD-10-CM | POA: Diagnosis not present

## 2019-08-23 DIAGNOSIS — T17908A Unspecified foreign body in respiratory tract, part unspecified causing other injury, initial encounter: Secondary | ICD-10-CM

## 2019-08-23 DIAGNOSIS — N179 Acute kidney failure, unspecified: Secondary | ICD-10-CM | POA: Diagnosis present

## 2019-08-23 DIAGNOSIS — I5020 Unspecified systolic (congestive) heart failure: Secondary | ICD-10-CM

## 2019-08-23 DIAGNOSIS — Z888 Allergy status to other drugs, medicaments and biological substances status: Secondary | ICD-10-CM

## 2019-08-23 DIAGNOSIS — R9431 Abnormal electrocardiogram [ECG] [EKG]: Secondary | ICD-10-CM | POA: Diagnosis not present

## 2019-08-23 DIAGNOSIS — H3530 Unspecified macular degeneration: Secondary | ICD-10-CM | POA: Diagnosis present

## 2019-08-23 DIAGNOSIS — N183 Chronic kidney disease, stage 3 unspecified: Secondary | ICD-10-CM | POA: Diagnosis present

## 2019-08-23 DIAGNOSIS — Z7984 Long term (current) use of oral hypoglycemic drugs: Secondary | ICD-10-CM

## 2019-08-23 LAB — CBC WITH DIFFERENTIAL/PLATELET
Abs Immature Granulocytes: 0.07 10*3/uL (ref 0.00–0.07)
Basophils Absolute: 0 10*3/uL (ref 0.0–0.1)
Basophils Relative: 0 %
Eosinophils Absolute: 0 10*3/uL (ref 0.0–0.5)
Eosinophils Relative: 0 %
HCT: 31.8 % — ABNORMAL LOW (ref 39.0–52.0)
Hemoglobin: 10.5 g/dL — ABNORMAL LOW (ref 13.0–17.0)
Immature Granulocytes: 1 %
Lymphocytes Relative: 12 %
Lymphs Abs: 1.7 10*3/uL (ref 0.7–4.0)
MCH: 32.2 pg (ref 26.0–34.0)
MCHC: 33 g/dL (ref 30.0–36.0)
MCV: 97.5 fL (ref 80.0–100.0)
Monocytes Absolute: 1.8 10*3/uL — ABNORMAL HIGH (ref 0.1–1.0)
Monocytes Relative: 13 %
Neutro Abs: 10 10*3/uL — ABNORMAL HIGH (ref 1.7–7.7)
Neutrophils Relative %: 74 %
Platelets: 152 10*3/uL (ref 150–400)
RBC: 3.26 MIL/uL — ABNORMAL LOW (ref 4.22–5.81)
RDW: 13.5 % (ref 11.5–15.5)
WBC: 13.6 10*3/uL — ABNORMAL HIGH (ref 4.0–10.5)
nRBC: 0 % (ref 0.0–0.2)

## 2019-08-23 LAB — RESPIRATORY PANEL BY PCR

## 2019-08-23 LAB — BASIC METABOLIC PANEL
Anion gap: 10 (ref 5–15)
BUN: 33 mg/dL — ABNORMAL HIGH (ref 8–23)
CO2: 23 mmol/L (ref 22–32)
Calcium: 8.9 mg/dL (ref 8.9–10.3)
Chloride: 104 mmol/L (ref 98–111)
Creatinine, Ser: 1.65 mg/dL — ABNORMAL HIGH (ref 0.61–1.24)
GFR calc Af Amer: 45 mL/min — ABNORMAL LOW (ref >60)
GFR calc non Af Amer: 39 mL/min — ABNORMAL LOW (ref >60)
Glucose, Bld: 144 mg/dL — ABNORMAL HIGH (ref 70–99)
Potassium: 4.8 mmol/L (ref 3.5–5.1)
Sodium: 137 mmol/L (ref 135–145)

## 2019-08-23 LAB — FERRITIN: Ferritin: 644 ng/mL — ABNORMAL HIGH (ref 24–336)

## 2019-08-23 LAB — LACTATE DEHYDROGENASE: LDH: 492 U/L — ABNORMAL HIGH (ref 98–192)

## 2019-08-23 LAB — PROCALCITONIN: Procalcitonin: <0.1 ng/mL

## 2019-08-23 LAB — C-REACTIVE PROTEIN: CRP: 11.9 mg/dL — ABNORMAL HIGH (ref <1.0)

## 2019-08-23 LAB — SARS CORONAVIRUS 2 BY RT PCR (HOSPITAL ORDER, PERFORMED IN ~~LOC~~ HOSPITAL LAB)
SARS Coronavirus 2: NEGATIVE
SARS Coronavirus 2: NEGATIVE

## 2019-08-23 LAB — BRAIN NATRIURETIC PEPTIDE: B Natriuretic Peptide: 905.4 pg/mL — ABNORMAL HIGH (ref 0.0–100.0)

## 2019-08-23 LAB — CBG MONITORING, ED: Glucose-Capillary: 160 mg/dL — ABNORMAL HIGH (ref 70–99)

## 2019-08-23 LAB — TRIGLYCERIDES: Triglycerides: 64 mg/dL (ref <150)

## 2019-08-23 LAB — LACTIC ACID, PLASMA
Lactic Acid, Venous: 1.4 mmol/L (ref 0.5–1.9)
Lactic Acid, Venous: 1.4 mmol/L (ref 0.5–1.9)

## 2019-08-23 MED ORDER — AZITHROMYCIN 500 MG PO TABS
500.0000 mg | ORAL_TABLET | Freq: Every day | ORAL | Status: DC
  Administered 2019-08-24: 17:00:00 500 mg via ORAL
  Filled 2019-08-23: qty 1

## 2019-08-23 MED ORDER — METHYLPREDNISOLONE SODIUM SUCC 40 MG IJ SOLR
40.0000 mg | Freq: Three times a day (TID) | INTRAMUSCULAR | Status: DC
  Administered 2019-08-23 – 2019-08-25 (×6): 40 mg via INTRAVENOUS
  Filled 2019-08-23 (×9): qty 1

## 2019-08-23 MED ORDER — SODIUM CHLORIDE 0.9 % IV SOLN
2.0000 g | INTRAVENOUS | Status: DC
  Administered 2019-08-24: 17:00:00 2 g via INTRAVENOUS
  Filled 2019-08-23: qty 20

## 2019-08-23 MED ORDER — TRAMADOL HCL 50 MG PO TABS: 50.0000 mg | ORAL_TABLET | Freq: Four times a day (QID) | ORAL | Status: DC | PRN

## 2019-08-23 MED ORDER — SODIUM CHLORIDE 0.9 % IV SOLN
500.0000 mg | Freq: Once | INTRAVENOUS | Status: AC
  Administered 2019-08-23: 18:00:00 500 mg via INTRAVENOUS
  Filled 2019-08-23 (×2): qty 500

## 2019-08-23 MED ORDER — IPRATROPIUM-ALBUTEROL 0.5-2.5 (3) MG/3ML IN SOLN: 3.0000 mL | Freq: Four times a day (QID) | RESPIRATORY_TRACT | Status: DC

## 2019-08-23 MED ORDER — INSULIN ASPART 100 UNIT/ML ~~LOC~~ SOLN
0.0000 [IU] | Freq: Three times a day (TID) | SUBCUTANEOUS | Status: DC
  Administered 2019-08-24: 07:00:00 2 [IU] via SUBCUTANEOUS
  Administered 2019-08-24 (×2): 3 [IU] via SUBCUTANEOUS
  Administered 2019-08-25: 18:00:00 5 [IU] via SUBCUTANEOUS
  Administered 2019-08-25: 12:00:00 7 [IU] via SUBCUTANEOUS
  Administered 2019-08-25: 08:00:00 3 [IU] via SUBCUTANEOUS

## 2019-08-23 MED ORDER — IPRATROPIUM-ALBUTEROL 0.5-2.5 (3) MG/3ML IN SOLN
3.0000 mL | Freq: Three times a day (TID) | RESPIRATORY_TRACT | Status: DC
  Administered 2019-08-23 – 2019-08-24 (×2): 3 mL via RESPIRATORY_TRACT
  Filled 2019-08-23 (×2): qty 3

## 2019-08-23 MED ORDER — SODIUM CHLORIDE 0.9 % IV BOLUS
1000.0000 mL | Freq: Once | INTRAVENOUS | Status: AC
  Administered 2019-08-23: 15:00:00 1000 mL via INTRAVENOUS

## 2019-08-23 MED ORDER — INSULIN ASPART 100 UNIT/ML ~~LOC~~ SOLN
0.0000 [IU] | Freq: Every day | SUBCUTANEOUS | Status: DC
  Administered 2019-08-24 – 2019-08-25 (×2): 3 [IU] via SUBCUTANEOUS

## 2019-08-23 MED ORDER — CARBAMAZEPINE 200 MG PO TABS: 100.0000 mg | ORAL_TABLET | Freq: Two times a day (BID) | ORAL | Status: DC

## 2019-08-23 MED ORDER — ENOXAPARIN SODIUM 40 MG/0.4ML ~~LOC~~ SOLN
40.0000 mg | SUBCUTANEOUS | Status: DC
  Administered 2019-08-24 – 2019-08-27 (×4): 40 mg via SUBCUTANEOUS
  Filled 2019-08-23 (×4): qty 0.4

## 2019-08-23 MED ORDER — MOMETASONE FURO-FORMOTEROL FUM 100-5 MCG/ACT IN AERO
2.0000 | INHALATION_SPRAY | Freq: Two times a day (BID) | RESPIRATORY_TRACT | Status: DC
  Administered 2019-08-24 – 2019-08-28 (×7): 2 via RESPIRATORY_TRACT
  Filled 2019-08-23 (×3): qty 8.8

## 2019-08-23 MED ORDER — SODIUM CHLORIDE 0.9 % IV SOLN: 2.0000 g | INTRAVENOUS | Status: DC

## 2019-08-23 MED ORDER — ALBUTEROL SULFATE HFA 108 (90 BASE) MCG/ACT IN AERS
4.0000 | INHALATION_SPRAY | RESPIRATORY_TRACT | Status: DC | PRN
  Administered 2019-08-23: 17:00:00 4 via RESPIRATORY_TRACT
  Filled 2019-08-23: qty 6.7

## 2019-08-23 MED ORDER — SODIUM CHLORIDE 0.9 % IV SOLN
1.0000 g | Freq: Once | INTRAVENOUS | Status: AC
  Administered 2019-08-23: 17:00:00 1 g via INTRAVENOUS
  Filled 2019-08-23: qty 10

## 2019-08-23 MED ORDER — SODIUM CHLORIDE 0.9 % IV SOLN
INTRAVENOUS | Status: DC
  Administered 2019-08-23 – 2019-08-24 (×2): via INTRAVENOUS

## 2019-08-23 MED ORDER — IPRATROPIUM-ALBUTEROL 0.5-2.5 (3) MG/3ML IN SOLN
3.0000 mL | Freq: Four times a day (QID) | RESPIRATORY_TRACT | Status: DC
  Administered 2019-08-23: 18:00:00 3 mL via RESPIRATORY_TRACT
  Filled 2019-08-23: qty 3

## 2019-08-23 NOTE — ED Provider Notes (Signed)
[facility] EMERGENCY DEPARTMENT Provider Note   CSN: [id number] Arrival date & time: 09/07/2019  1349     History   Chief Complaint Chief Complaint  Patient presents with   Shortness of Breath   Weakness    HPI Bryan Turner is a 78 y.o. male.     The history is provided by the patient and medical records. No language interpreter was used.  Shortness of Breath Weakness Associated symptoms: shortness of breath      78 year old male with hx DM, asthma, presented to the ED via EMS from home for evaluation of shortness of breath and weakness.  Patient is hard of hearing.  History obtained through patient and wife who is at bedside.  For the past 4 to 5 days patient has had increase shortness of breath, pleuritic chest pain, decreased appetite, generalized weakness and overall is not feeling well.  For the past 2 days endorsed fever, wife states patient has a temp of 99.9 at home.  Patient reports cough is nonproductive.  He denies any recent sick contact or recent travel.  He denies nausea vomiting or diarrhea, no loss of taste or smell, no dysuria.  He has noticed some increased wheezing and does have a known history of asthma.  Does have history of diabetes currently on oral medication.   Past Medical History:  Diagnosis Date   Asthma    Diabetes mellitus without complication ([place])    Hard of hearing    wears hearing aides   Macular degeneration    Plantar fasciitis     Patient Active Problem List   Diagnosis Date Noted   Cholecystitis 03/28/2014   Cholelithiasis and acute cholecystitis without obstruction 03/28/2014    Past Surgical History:  Procedure Laterality Date   CHOLECYSTECTOMY N/A 03/28/2014   Procedure: LAPAROSCOPIC CHOLECYSTECTOMY WITH INTRAOPERATIVE CHOLANGIOGRAM;  Surgeon: [name], MD;  Location: [facility];  Service: General;  Laterality: N/A;   EYE SURGERY     HEMORRHOID SURGERY          Home Medications     Prior to Admission medications   Medication Sig Start Date End Date Taking? Authorizing Provider  carbamazepine (TEGRETOL) 200 MG tablet Take 0.5 tablets (100 mg total) by mouth 2 (two) times daily. 02/18/17   Veryl Speak, MD  Fish Oil-Cholecalciferol (FISH OIL + D3 PO) Take 1 capsule by mouth daily.    [provider]  HYDROcodone-acetaminophen (NORCO) 5-325 MG tablet Take 1-2 tablets by mouth every 6 (six) hours as needed. 02/18/17   Veryl Speak, MD  Lutein-Zeaxanthin 15-0.7 MG CAPS Take 1 capsule by mouth daily.    [provider]  metFORMIN (GLUCOPHAGE) 500 MG tablet Take 500 mg by mouth 2 (two) times daily. 04/18/15   [provider]  Multiple Vitamins-Minerals (PRESERVISION AREDS 2 PO) Take 25 mg by mouth. 2 capsules daily    [provider]  multivitamin (ONE-A-DAY MEN'S) TABS tablet Take 1 tablet by mouth daily.    [provider]  PROAIR HFA 108 (90 BASE) MCG/ACT inhaler As needed 04/02/14   [provider]  saw palmetto 80 MG capsule Take 80 mg by mouth daily.    [provider]  traMADol (ULTRAM) 50 MG tablet Take 1 tablet (50 mg total) by mouth every 6 (six) hours as needed. 11/24/13   Ashley Murrain, NP  traMADol (ULTRAM) 50 MG tablet Take 1 tablet (50 mg total) by mouth 3 (three) times daily. 06/19/14  Wallene Huh, DPM  traMADol (ULTRAM) 50 MG tablet Take 1 tablet (50 mg total) by mouth every 6 (six) hours as needed. 01/14/17   Trula Slade, DPM    Family History No family history on file.  Social History Social History   Tobacco Use   Smoking status: Former Smoker    Quit date: 06/16/1992    Years since quitting: 27.2   Smokeless tobacco: Never Used  Substance Use Topics   Alcohol use: No   Drug use: No     Allergies   Aspirin, Codeine, Flagyl [metronidazole], Prednisone, and Tamiflu [oseltamivir]   Review of Systems Review of Systems  Respiratory: Positive for shortness of breath.    Neurological: Positive for weakness.  All other systems reviewed and are negative.    Physical Exam Updated Vital Signs BP 106/64 (BP Location: Right Arm)    Pulse 84    Temp 98.3 F (36.8 C) (Oral)    Resp 16    SpO2 97%   Physical Exam Vitals signs and nursing note reviewed.  Constitutional:      General: He is not in acute distress.    Appearance: He is well-developed.     Comments: Elderly male in no acute discomfort.  HENT:     Head: Atraumatic.     Mouth/Throat:     Comments: Mouth is dry Eyes:     Conjunctiva/sclera: Conjunctivae normal.  Neck:     Musculoskeletal: Neck supple.     Vascular: No JVD.  Cardiovascular:     Rate and Rhythm: Normal rate and regular rhythm.  Pulmonary:     Effort: Pulmonary effort is normal.     Breath sounds: Wheezing (Faint expiratory wheezes) present.  Chest:     Chest wall: No tenderness.  Abdominal:     Palpations: Abdomen is soft.     Tenderness: There is no abdominal tenderness.  Musculoskeletal:     Right lower leg: No edema.     Left lower leg: No edema.  Skin:    Findings: No rash.  Neurological:     Mental Status: He is alert and oriented to person, place, and time.     Comments: Patient is hard of hearing  Psychiatric:        Mood and Affect: Mood normal.      ED Treatments / Results  Labs (all labs ordered are listed, but only abnormal results are displayed) Labs Reviewed  BASIC METABOLIC PANEL - Abnormal; Notable for the following components:      Result Value   Glucose, Bld 144 (*)    BUN 33 (*)    Creatinine, Ser 1.65 (*)    GFR calc non Af Amer 39 (*)    GFR calc Af Amer 45 (*)    All other components within normal limits  CBC WITH DIFFERENTIAL/PLATELET - Abnormal; Notable for the following components:   WBC 13.6 (*)    RBC 3.26 (*)    Hemoglobin 10.5 (*)    HCT 31.8 (*)    Neutro Abs 10.0 (*)    Monocytes Absolute 1.8 (*)    All other components within normal limits  CULTURE, BLOOD (ROUTINE X  2)  CULTURE, BLOOD (ROUTINE X 2)  SARS CORONAVIRUS 2 (HOSPITAL ORDER, Door LAB)  BRAIN NATRIURETIC PEPTIDE  URINALYSIS, ROUTINE W REFLEX MICROSCOPIC  LACTIC ACID, PLASMA  LACTIC ACID, PLASMA  D-DIMER, QUANTITATIVE (NOT AT [facility])  PROCALCITONIN  LACTATE DEHYDROGENASE  FERRITIN  FIBRINOGEN  C-REACTIVE PROTEIN  TRIGLYCERIDES    EKG None  ED ECG REPORT   Date: 08/29/2019  Rate: 90  Rhythm: normal sinus rhythm  QRS Axis: right  Intervals: normal  ST/T Wave abnormalities: nonspecific ST changes  Conduction Disutrbances:right bundle branch block  Narrative Interpretation:   Old EKG Reviewed: unchanged  I have personally reviewed the EKG tracing and agree with the computerized printout as noted.   Radiology Dg Chest 2 View  Result Date: 09/01/2019 CLINICAL DATA:  Shortness of breath EXAM: CHEST - 2 VIEW COMPARISON:  04/02/2014 FINDINGS: Cardiomegaly. Bilateral interstitial pulmonary opacity, most conspicuous in the perihilar right lung. There may be trace pleural effusions. Disc degenerative disease of the thoracic spine. IMPRESSION: Cardiomegaly. Bilateral interstitial pulmonary opacity, most conspicuous in the perihilar right lung. There may be trace pleural effusions. Findings are consistent with edema or infection. Electronically Signed   By: Eddie Candle M.D.   On: 09/05/2019 14:50    Procedures Procedures (including critical care time)  Medications Ordered in ED Medications  albuterol (VENTOLIN HFA) 108 (90 Base) MCG/ACT inhaler 4 puff (has no administration in time range)  sodium chloride 0.9 % bolus 1,000 mL (1,000 mLs Intravenous New Bag/Given 08/18/2019 1457)     Initial Impression / Assessment and Plan / ED Course  I have reviewed the triage vital signs and the nursing notes.  Pertinent labs & imaging results that were available during my care of the patient were reviewed by me and considered in my medical decision making (see chart for  details).        BP 106/64 (BP Location: Right Arm)    Pulse 84    Temp 98.3 F (36.8 C) (Oral)    Resp 16    SpO2 97%    Final Clinical Impressions(s) / ED Diagnoses   Final diagnoses:  None    ED Discharge Orders    None     2:37 PM Elderly male presents with symptoms concerning for potential COVID-19.  He is here with 4 to 5 days worth of subjective fever, chills, body aches, pleuritic chest pain, shortness of breath and nonproductive cough.  On exam he does have scattered wheezes.  Evidence of mild AKI with BUN 33, creatinine of 1.65.  Elevated WBC of 13.6.  Chest x-ray shows cardiomegaly.  Bilateral interstitial pulmonary opacity most conspicuous in the perihilar right lung with trace pleural effusion.  Findings consistent with edema or infection.  In the setting of fever, pleuritic chest pain and productive cough and elevated white count this is likely infectious in etiology.  COVID-19 test is pending if negative, patient will need to be treated for community-acquired pneumonia.  He is not hypoxic but mildly hypotensive with a blood pressure of 106/64.  IV fluid given.  Pt sign out to Dr. Vanita Panda.   Bryan Turner was evaluated in Emergency Department on 08/24/2019 for the symptoms described in the history of present illness. He was evaluated in the context of the global COVID-19 pandemic, which necessitated consideration that the patient might be at risk for infection with the SARS-CoV-2 virus that causes COVID-19. Institutional protocols and algorithms that pertain to the evaluation of patients at risk for COVID-19 are in a state of rapid change based on information released by regulatory bodies including the CDC and federal and state organizations. These policies and algorithms were followed during the patient's care in the ED.    Domenic Moras, PA-C 09/05/2019 [place], [place], MD 08/24/19 [phone]

## 2019-08-23 NOTE — ED Notes (Signed)
Admitting MD at bedside.

## 2019-08-23 NOTE — ED Provider Notes (Signed)
4:53 PM Patient awake and alert, on 2 L, with more appropriate respiratory rate, saturation is now appropriate as well. He is in no distress. He discussed x-ray findings, initial lab results with him and his wife. Initial COVID test negative, though other lab abnormalities are more consistent with COVID then multifocal pneumonia. Patient starting antibiotics, will require admission given his need for new oxygen supplementation, additional monitoring, management.   Carmin Muskrat, MD 08/31/2019 [phone]

## 2019-08-23 NOTE — ED Notes (Signed)
Blood draw attempted x2 unsuccessful 

## 2019-08-23 NOTE — ED Triage Notes (Signed)
Pt arrives to ED from home home with complaints of shortness of breath, weakness, and cough since Sunday. Patient states they called EMS today because patients temperature was 99.42F. On EMS arrival to patient he was in an Afib rhythm, RR was 30's, and BP was 92/47. Patient states he has had right sided just pain on and off since Tuesday as well.

## 2019-08-23 NOTE — H&P (Signed)
History and Physical    Bryan Turner [id number] DOB: 1941-09-09 DOA: 08/18/2019  PCP: Lawerance Cruel, MD   Patient coming from: Home  I have personally briefly reviewed patient's old medical records in [place]  Chief Complaint: Shortness of breath  HPI: Bryan Turner is a 78 y.o. male with medical history significant of asthma, diabetes mellitus type 2, hard of hearing presented with progressively worsening shortness of breath that started 5 days ago.  Patient has been having mostly dry cough with worsening exertional shortness of breath.  He had a temperature of 99.9 at home today.  He has felt worsening weakness with decreased appetite but denies any chest pain, abdominal pain, diarrhea, dysuria, loss of taste or smell.  Patient also noticed some wheezing at home and currently does not have rescue inhaler at home.  No worsening swelling of feet, loss of consciousness, seizures, recent travel.  Family member was also recently diagnosed with viral pneumonia.  No recent contact with COVID-19 patient.  ED Course: He was found to have white count of 13.6 and chest x-ray showed bilateral interstitial pulmonary opacity most conspicuous in the perihilar right lung with trace pleural effusion, findings consistent with edema or infection.  COVID-19 testing was negative.  He required 2 L oxygen.  He was given intravenous antibiotics.  CRP, ferritin and LDH were mildly elevated.  Hospitalist service was called to evaluate the patient.  Review of Systems: As per HPI otherwise all other 10 point systems were reviewed and are negative.   Past Medical History:  Diagnosis Date  . Asthma   . Diabetes mellitus without complication ([place])   . Hard of hearing    wears hearing aides  . Macular degeneration   . Plantar fasciitis     Past Surgical History:  Procedure Laterality Date  . CHOLECYSTECTOMY N/A 03/28/2014   Procedure: LAPAROSCOPIC CHOLECYSTECTOMY WITH INTRAOPERATIVE  CHOLANGIOGRAM;  Surgeon: [name], MD;  Location: [facility];  Service: General;  Laterality: N/A;  . EYE SURGERY    . HEMORRHOID SURGERY     Social history  reports that he quit smoking about 27 years ago. He has never used smokeless tobacco. He reports that he does not drink alcohol or use drugs.  -Lives at home with wife.  Allergies  Allergen Reactions  . Aspirin     Bleeds easily  . Codeine Swelling    Throat swelling  . Flagyl [Metronidazole] Other (See Comments)    Stomach issues  . Prednisone   . Tamiflu [Oseltamivir] Other (See Comments)    Hallucinations    Family history -Negative for cancer or TB.  Prior to Admission medications   Medication Sig Start Date End Date Taking? Authorizing Provider  carbamazepine (TEGRETOL) 200 MG tablet Take 0.5 tablets (100 mg total) by mouth 2 (two) times daily. 02/18/17   Veryl Speak, MD  Fish Oil-Cholecalciferol (FISH OIL + D3 PO) Take 1 capsule by mouth daily.    [provider]  HYDROcodone-acetaminophen (NORCO) 5-325 MG tablet Take 1-2 tablets by mouth every 6 (six) hours as needed. 02/18/17   Veryl Speak, MD  Lutein-Zeaxanthin 15-0.7 MG CAPS Take 1 capsule by mouth daily.    [provider]  metFORMIN (GLUCOPHAGE) 500 MG tablet Take 500 mg by mouth 2 (two) times daily. 04/18/15   [provider]  Multiple Vitamins-Minerals (PRESERVISION AREDS 2 PO) Take 25 mg by mouth. 2 capsules daily    [provider]  multivitamin (ONE-A-DAY MEN'S) TABS  tablet Take 1 tablet by mouth daily.    [provider]  PROAIR HFA 108 (90 BASE) MCG/ACT inhaler As needed 04/02/14   [provider]  saw palmetto 80 MG capsule Take 80 mg by mouth daily.    [provider]  traMADol (ULTRAM) 50 MG tablet Take 1 tablet (50 mg total) by mouth every 6 (six) hours as needed. 11/24/13   Ashley Murrain, NP  traMADol (ULTRAM) 50 MG tablet Take 1 tablet (50 mg total) by mouth 3 (three) times daily.  06/19/14   Wallene Huh, DPM  traMADol (ULTRAM) 50 MG tablet Take 1 tablet (50 mg total) by mouth every 6 (six) hours as needed. 01/14/17   Trula Slade, DPM    Physical Exam: Vitals:   09/14/2019 1630 09/02/2019 1645 08/26/2019 1700 08/28/2019 1730  BP: 106/72 105/62 106/67 (!) 109/58  Pulse: 81 82 78 79  Resp: (!) 30 (!) 28 (!) 23 (!) 26  Temp:      TempSrc:      SpO2: 100% 96% 99% 98%    Constitutional: NAD, calm, comfortable.  Extremely hard of hearing. Vitals:   08/25/2019 1630 09/12/2019 1645 08/22/2019 1700 08/29/2019 1730  BP: 106/72 105/62 106/67 (!) 109/58  Pulse: 81 82 78 79  Resp: (!) 30 (!) 28 (!) 23 (!) 26  Temp:      TempSrc:      SpO2: 100% 96% 99% 98%   Eyes: PERRL, lids and conjunctivae normal ENMT: Mucous membranes are dry. Posterior pharynx clear of any exudate or lesions. Neck: normal, supple, no masses, no thyromegaly Respiratory: bilateral decreased breath sounds at bases.  Diffuse expiratory wheezing with some crackles.  Intermittently tachypneic. No accessory muscle use.  Cardiovascular: S1 S2 positive, rate controlled. No extremity edema. 2+ pedal pulses.  Abdomen: no tenderness, no masses palpated. No hepatosplenomegaly. Bowel sounds positive.  Musculoskeletal: no clubbing / cyanosis. No joint deformity upper and lower extremities.  Skin: no rashes, lesions, ulcers. No induration Neurologic: CN 2-12 grossly intact. Moving extremities. No focal neurologic deficits.  Psychiatric: Normal judgment and insight. Alert and oriented x 3. Normal mood.    Labs on Admission: I have personally reviewed following labs and imaging studies  CBC: Recent Labs  Lab 09/07/2019 1400  WBC 13.6*  NEUTROABS 10.0*  HGB 10.5*  HCT 31.8*  MCV 97.5  PLT 0000000   Basic Metabolic Panel: Recent Labs  Lab 08/19/2019 1400  NA 137  K 4.8  CL 104  CO2 23  GLUCOSE 144*  BUN 33*  CREATININE 1.65*  CALCIUM 8.9   GFR: CrCl cannot be calculated (Unknown ideal weight.). Liver  Function Tests: No results for input(s): AST, ALT, ALKPHOS, BILITOT, PROT, ALBUMIN in the last 168 hours. No results for input(s): LIPASE, AMYLASE in the last 168 hours. No results for input(s): AMMONIA in the last 168 hours. Coagulation Profile: No results for input(s): INR, PROTIME in the last 168 hours. Cardiac Enzymes: No results for input(s): CKTOTAL, CKMB, CKMBINDEX, TROPONINI in the last 168 hours. BNP (last 3 results) No results for input(s): PROBNP in the last 8760 hours. HbA1C: No results for input(s): HGBA1C in the last 72 hours. CBG: No results for input(s): GLUCAP in the last 168 hours. Lipid Profile: Recent Labs     1459  TRIG 64   Thyroid Function Tests: No results for input(s): TSH, T4TOTAL, FREET4, T3FREE, THYROIDAB in the last 72 hours. Anemia Panel: Recent Labs    09/08/2019 1459  FERRITIN 644*  Urine analysis:    Component Value Date/Time   COLORURINE AMBER (A) 03/30/2014 1035   APPEARANCEUR CLOUDY (A) 03/30/2014 1035   LABSPEC 1.021 03/30/2014 1035   PHURINE 5.5 03/30/2014 1035   GLUCOSEU NEGATIVE 03/30/2014 1035   HGBUR NEGATIVE 03/30/2014 1035   BILIRUBINUR SMALL (A) 03/30/2014 1035   KETONESUR >80 (A) 03/30/2014 1035   PROTEINUR 30 (A) 03/30/2014 1035   UROBILINOGEN 1.0 03/30/2014 1035   NITRITE NEGATIVE 03/30/2014 1035   LEUKOCYTESUR NEGATIVE 03/30/2014 1035    Radiological Exams on Admission: Dg Chest 2 View  Result Date: 08/29/2019 CLINICAL DATA:  Shortness of breath EXAM: CHEST - 2 VIEW COMPARISON:  04/02/2014 FINDINGS: Cardiomegaly. Bilateral interstitial pulmonary opacity, most conspicuous in the perihilar right lung. There may be trace pleural effusions. Disc degenerative disease of the thoracic spine. IMPRESSION: Cardiomegaly. Bilateral interstitial pulmonary opacity, most conspicuous in the perihilar right lung. There may be trace pleural effusions. Findings are consistent with edema or infection. Electronically Signed   By: Eddie Candle M.D.   On: 09/02/2019 14:50    EKG: Independently reviewed.  Normal sinus rhythm at 90 bpm.  No ST elevations or depressions.  Assessment/Plan  Probable community-acquired bacterial pneumonia Hypoxia Asthma with exacerbation Leukocytosis -Patient presented with worsening shortness of breath, wheezing with low-grade temperatures along with leukocytosis.  Chest x-ray shows bilateral interstitial opacity consistent with edema or infection.  Initial COVID-19 testing is negative but his CRP, LDH, ferritin are elevated.  D-dimer is pending.  Will check COVID-19 again to make sure the earlier test was not false negative. -Continue Rocephin and Zithromax.  Urine streptococcal and Legionella antigen. -Follow cultures.  Respiratory panel PCR -Solu-Medrol 40 mg IV every 8 hours.  Duo nebs and Dulera. -Incentive spirometry -Check labs in a.m.  Generalized deconditioning  -PT eval  Diabetes mellitus type 2 -Hold metformin.  CBGs with SSI.  Check A1c.   DVT prophylaxis: Lovenox Code Status: DNR Family Communication: Wife at bedside.  Daughter/Lisa on phone Disposition Plan: Home in 1 to 2 days once clinically improved Consults called: None Admission status: Observation/Medsurge  Severity of Illness: The appropriate patient status for this patient is OBSERVATION. Observation status is judged to be reasonable and necessary in order to provide the required intensity of service to ensure the patient's safety. The patient's presenting symptoms, physical exam findings, and initial radiographic and laboratory data in the context of their medical condition is felt to place them at decreased risk for further clinical deterioration. Furthermore, it is anticipated that the patient will be medically stable for discharge from the hospital within 2 midnights of admission. The following factors support the patient status of observation.   " The patient's presenting symptoms include shortness of  breath/fever. " The physical exam findings include tachypnea/diffuse wheezing. " The initial radiographic and laboratory data are leukocytosis along with chest x-ray findings compatible with probable infection.      Aline August MD [facility]  08/28/2019, 5:34 PM

## 2019-08-23 NOTE — Progress Notes (Signed)
Attempted call back to bedside RN at [phone] for page sent out at [phone].   Lovey Newcomer, NP [facility] 7p-7a [phone]

## 2019-08-24 ENCOUNTER — Inpatient Hospital Stay (HOSPITAL_COMMUNITY): Payer: Medicare Other

## 2019-08-24 ENCOUNTER — Encounter (HOSPITAL_COMMUNITY): Payer: Self-pay | Admitting: Internal Medicine

## 2019-08-24 DIAGNOSIS — R1313 Dysphagia, pharyngeal phase: Secondary | ICD-10-CM | POA: Diagnosis not present

## 2019-08-24 DIAGNOSIS — N183 Chronic kidney disease, stage 3 unspecified: Secondary | ICD-10-CM | POA: Diagnosis present

## 2019-08-24 DIAGNOSIS — D631 Anemia in chronic kidney disease: Secondary | ICD-10-CM | POA: Diagnosis not present

## 2019-08-24 DIAGNOSIS — I361 Nonrheumatic tricuspid (valve) insufficiency: Secondary | ICD-10-CM | POA: Diagnosis not present

## 2019-08-24 DIAGNOSIS — E875 Hyperkalemia: Secondary | ICD-10-CM | POA: Diagnosis not present

## 2019-08-24 DIAGNOSIS — E872 Acidosis: Secondary | ICD-10-CM | POA: Diagnosis not present

## 2019-08-24 DIAGNOSIS — Z20828 Contact with and (suspected) exposure to other viral communicable diseases: Secondary | ICD-10-CM | POA: Diagnosis not present

## 2019-08-24 DIAGNOSIS — R0603 Acute respiratory distress: Secondary | ICD-10-CM | POA: Diagnosis not present

## 2019-08-24 DIAGNOSIS — I4891 Unspecified atrial fibrillation: Secondary | ICD-10-CM | POA: Diagnosis not present

## 2019-08-24 DIAGNOSIS — H9190 Unspecified hearing loss, unspecified ear: Secondary | ICD-10-CM

## 2019-08-24 DIAGNOSIS — Z66 Do not resuscitate: Secondary | ICD-10-CM | POA: Diagnosis not present

## 2019-08-24 DIAGNOSIS — I2510 Atherosclerotic heart disease of native coronary artery without angina pectoris: Secondary | ICD-10-CM | POA: Diagnosis present

## 2019-08-24 DIAGNOSIS — I480 Paroxysmal atrial fibrillation: Secondary | ICD-10-CM | POA: Diagnosis not present

## 2019-08-24 DIAGNOSIS — I214 Non-ST elevation (NSTEMI) myocardial infarction: Secondary | ICD-10-CM | POA: Diagnosis not present

## 2019-08-24 DIAGNOSIS — I700 Atherosclerosis of aorta: Secondary | ICD-10-CM | POA: Diagnosis present

## 2019-08-24 DIAGNOSIS — T17908D Unspecified foreign body in respiratory tract, part unspecified causing other injury, subsequent encounter: Secondary | ICD-10-CM | POA: Diagnosis not present

## 2019-08-24 DIAGNOSIS — E1122 Type 2 diabetes mellitus with diabetic chronic kidney disease: Secondary | ICD-10-CM | POA: Diagnosis present

## 2019-08-24 DIAGNOSIS — J189 Pneumonia, unspecified organism: Secondary | ICD-10-CM | POA: Diagnosis present

## 2019-08-24 DIAGNOSIS — I5020 Unspecified systolic (congestive) heart failure: Secondary | ICD-10-CM | POA: Diagnosis not present

## 2019-08-24 DIAGNOSIS — I252 Old myocardial infarction: Secondary | ICD-10-CM | POA: Diagnosis not present

## 2019-08-24 DIAGNOSIS — J90 Pleural effusion, not elsewhere classified: Secondary | ICD-10-CM | POA: Diagnosis not present

## 2019-08-24 DIAGNOSIS — I959 Hypotension, unspecified: Secondary | ICD-10-CM | POA: Diagnosis present

## 2019-08-24 DIAGNOSIS — I340 Nonrheumatic mitral (valve) insufficiency: Secondary | ICD-10-CM | POA: Diagnosis not present

## 2019-08-24 DIAGNOSIS — R570 Cardiogenic shock: Secondary | ICD-10-CM | POA: Diagnosis not present

## 2019-08-24 DIAGNOSIS — I5043 Acute on chronic combined systolic (congestive) and diastolic (congestive) heart failure: Secondary | ICD-10-CM | POA: Diagnosis not present

## 2019-08-24 DIAGNOSIS — J9601 Acute respiratory failure with hypoxia: Secondary | ICD-10-CM | POA: Diagnosis not present

## 2019-08-24 DIAGNOSIS — E1165 Type 2 diabetes mellitus with hyperglycemia: Secondary | ICD-10-CM | POA: Diagnosis not present

## 2019-08-24 DIAGNOSIS — D72829 Elevated white blood cell count, unspecified: Secondary | ICD-10-CM | POA: Diagnosis not present

## 2019-08-24 DIAGNOSIS — I083 Combined rheumatic disorders of mitral, aortic and tricuspid valves: Secondary | ICD-10-CM | POA: Diagnosis present

## 2019-08-24 DIAGNOSIS — T17908A Unspecified foreign body in respiratory tract, part unspecified causing other injury, initial encounter: Secondary | ICD-10-CM | POA: Diagnosis not present

## 2019-08-24 DIAGNOSIS — R079 Chest pain, unspecified: Secondary | ICD-10-CM | POA: Diagnosis not present

## 2019-08-24 DIAGNOSIS — J690 Pneumonitis due to inhalation of food and vomit: Secondary | ICD-10-CM | POA: Diagnosis not present

## 2019-08-24 DIAGNOSIS — I4510 Unspecified right bundle-branch block: Secondary | ICD-10-CM | POA: Diagnosis present

## 2019-08-24 DIAGNOSIS — I5021 Acute systolic (congestive) heart failure: Secondary | ICD-10-CM | POA: Diagnosis not present

## 2019-08-24 DIAGNOSIS — E119 Type 2 diabetes mellitus without complications: Secondary | ICD-10-CM | POA: Diagnosis not present

## 2019-08-24 DIAGNOSIS — I2129 ST elevation (STEMI) myocardial infarction involving other sites: Secondary | ICD-10-CM | POA: Diagnosis not present

## 2019-08-24 DIAGNOSIS — N179 Acute kidney failure, unspecified: Secondary | ICD-10-CM | POA: Diagnosis present

## 2019-08-24 DIAGNOSIS — R0602 Shortness of breath: Secondary | ICD-10-CM | POA: Diagnosis not present

## 2019-08-24 DIAGNOSIS — J45901 Unspecified asthma with (acute) exacerbation: Secondary | ICD-10-CM | POA: Diagnosis not present

## 2019-08-24 DIAGNOSIS — Z515 Encounter for palliative care: Secondary | ICD-10-CM | POA: Diagnosis not present

## 2019-08-24 DIAGNOSIS — Z7189 Other specified counseling: Secondary | ICD-10-CM | POA: Diagnosis not present

## 2019-08-24 LAB — COMPREHENSIVE METABOLIC PANEL
ALT: 33 U/L (ref 0–44)
AST: 70 U/L — ABNORMAL HIGH (ref 15–41)
Albumin: 2.8 g/dL — ABNORMAL LOW (ref 3.5–5.0)
Alkaline Phosphatase: 49 U/L (ref 38–126)
Anion gap: 13 (ref 5–15)
BUN: 31 mg/dL — ABNORMAL HIGH (ref 8–23)
CO2: 18 mmol/L — ABNORMAL LOW (ref 22–32)
Calcium: 8.5 mg/dL — ABNORMAL LOW (ref 8.9–10.3)
Chloride: 104 mmol/L (ref 98–111)
Creatinine, Ser: 1.77 mg/dL — ABNORMAL HIGH (ref 0.61–1.24)
GFR calc Af Amer: 41 mL/min — ABNORMAL LOW (ref >60)
GFR calc non Af Amer: 36 mL/min — ABNORMAL LOW (ref >60)
Glucose, Bld: 209 mg/dL — ABNORMAL HIGH (ref 70–99)
Potassium: 5.1 mmol/L (ref 3.5–5.1)
Sodium: 135 mmol/L (ref 135–145)
Total Bilirubin: 1 mg/dL (ref 0.3–1.2)
Total Protein: 5.9 g/dL — ABNORMAL LOW (ref 6.5–8.1)

## 2019-08-24 LAB — C-REACTIVE PROTEIN: CRP: 15.6 mg/dL — ABNORMAL HIGH (ref <1.0)

## 2019-08-24 LAB — URINALYSIS, ROUTINE W REFLEX MICROSCOPIC
Bilirubin Urine: NEGATIVE
Glucose, UA: NEGATIVE mg/dL
Hgb urine dipstick: NEGATIVE
Ketones, ur: 5 mg/dL — AB
Leukocytes,Ua: NEGATIVE
Nitrite: NEGATIVE
Protein, ur: NEGATIVE mg/dL
Specific Gravity, Urine: 1.026 (ref 1.005–1.030)
pH: 5 (ref 5.0–8.0)

## 2019-08-24 LAB — CBC
HCT: 29.8 % — ABNORMAL LOW (ref 39.0–52.0)
Hemoglobin: 9.8 g/dL — ABNORMAL LOW (ref 13.0–17.0)
MCH: 31.6 pg (ref 26.0–34.0)
MCHC: 32.9 g/dL (ref 30.0–36.0)
MCV: 96.1 fL (ref 80.0–100.0)
Platelets: 164 10*3/uL (ref 150–400)
RBC: 3.1 MIL/uL — ABNORMAL LOW (ref 4.22–5.81)
RDW: 13.7 % (ref 11.5–15.5)
WBC: 12.7 10*3/uL — ABNORMAL HIGH (ref 4.0–10.5)
nRBC: 0 % (ref 0.0–0.2)

## 2019-08-24 LAB — HEMOGLOBIN A1C
Hgb A1c MFr Bld: 5.7 % — ABNORMAL HIGH (ref 4.8–5.6)
Mean Plasma Glucose: 116.89 mg/dL

## 2019-08-24 LAB — LACTATE DEHYDROGENASE: LDH: 452 U/L — ABNORMAL HIGH (ref 98–192)

## 2019-08-24 LAB — GLUCOSE, CAPILLARY
Glucose-Capillary: 175 mg/dL — ABNORMAL HIGH (ref 70–99)
Glucose-Capillary: 249 mg/dL — ABNORMAL HIGH (ref 70–99)
Glucose-Capillary: 243 mg/dL — ABNORMAL HIGH (ref 70–99)
Glucose-Capillary: 278 mg/dL — ABNORMAL HIGH (ref 70–99)

## 2019-08-24 LAB — FERRITIN: Ferritin: 702 ng/mL — ABNORMAL HIGH (ref 24–336)

## 2019-08-24 LAB — STREP PNEUMONIAE URINARY ANTIGEN: Strep Pneumo Urinary Antigen: NEGATIVE

## 2019-08-24 MED ORDER — RESOURCE THICKENUP CLEAR PO POWD
ORAL | Status: DC | PRN
  Filled 2019-08-24 (×2): qty 125

## 2019-08-24 MED ORDER — IPRATROPIUM-ALBUTEROL 0.5-2.5 (3) MG/3ML IN SOLN
3.0000 mL | Freq: Two times a day (BID) | RESPIRATORY_TRACT | Status: DC
  Administered 2019-08-24 – 2019-08-26 (×5): 3 mL via RESPIRATORY_TRACT
  Filled 2019-08-24 (×5): qty 3

## 2019-08-24 MED ORDER — ALBUTEROL SULFATE (2.5 MG/3ML) 0.083% IN NEBU
2.5000 mg | INHALATION_SOLUTION | RESPIRATORY_TRACT | Status: DC | PRN
  Administered 2019-08-25 – 2019-09-01 (×3): 2.5 mg via RESPIRATORY_TRACT
  Filled 2019-08-24 (×4): qty 3

## 2019-08-24 MED ORDER — ALBUTEROL SULFATE HFA 108 (90 BASE) MCG/ACT IN AERS: 1.0000 | INHALATION_SPRAY | Freq: Four times a day (QID) | RESPIRATORY_TRACT | Status: DC | PRN

## 2019-08-24 MED ORDER — STARCH (THICKENING) PO POWD: ORAL | Status: DC | PRN

## 2019-08-24 NOTE — Evaluation (Signed)
Clinical/Bedside Swallow Evaluation Patient Details  Name: Bryan Turner MRN: [id number] Date of Birth: [birth year]  Today's Date: 08/24/2019 Time: SLP Start Time (ACUTE ONLY): 1233 SLP Stop Time (ACUTE ONLY): 1255 SLP Time Calculation (min) (ACUTE ONLY): 22 min  Past Medical History:  Past Medical History:  Diagnosis Date  . Asthma   . Diabetes mellitus without complication ([place])   . Hard of hearing    wears hearing aides  . Macular degeneration   . Plantar fasciitis    Past Surgical History:  Past Surgical History:  Procedure Laterality Date  . CHOLECYSTECTOMY N/A 03/28/2014   Procedure: LAPAROSCOPIC CHOLECYSTECTOMY WITH INTRAOPERATIVE CHOLANGIOGRAM;  Surgeon: [name], MD;  Location: [facility];  Service: General;  Laterality: N/A;  . EYE SURGERY    . HEMORRHOID SURGERY     HPI:  78 yo admitted with SOB with PNA. PMHx: asthma, DM, macular degeneration   Assessment / Plan / Recommendation Clinical Impression  Clinical swallow evaluation reveals possible component of pharyngeal dysphagia. Pt noted to have immediate and delayed cough with consecutive larger cup and straw sips of thin liquids (impulsivity noted in rate of consumption). When pt consumed single small cup sips of thin liquids, no overt s/sx of aspiration were exhibited. Given onset of PNA and symptomatic clinical interaction, recommend follow up MBSS to objectively assess aspiration risk. Continue regular thin liquid diet with medicines in puree and safe swallowing precautions. Pt and spouse in agreement with plan.  SLP Visit Diagnosis: Dysphagia, unspecified (R13.10)    Aspiration Risk  Mild aspiration risk;Moderate aspiration risk    Diet Recommendation   Regular, thin liquids; safe swallow strategies;   Medication Administration: Whole meds with puree    Other  Recommendations Oral Care Recommendations: Oral care BID   Follow up Recommendations   MBSS      Frequency and Duration min 1 x/week  1  week       Prognosis Prognosis for Safe Diet Advancement: Good      Swallow Study   General Date of Onset: 08/22/2019 HPI: 78 yo admitted with SOB with PNA. PMHx: asthma, DM, macular degeneration Type of Study: Bedside Swallow Evaluation Previous Swallow Assessment: none on file Diet Prior to this Study: Regular;Thin liquids Temperature Spikes Noted: No Respiratory Status: Room air History of Recent Intubation: No Behavior/Cognition: Cooperative;Alert;Pleasant mood Oral Cavity Assessment: Within Functional Limits Oral Care Completed by SLP: No Oral Cavity - Dentition: Adequate natural dentition Vision: Functional for self-feeding Self-Feeding Abilities: Able to feed self Patient Positioning: Upright in chair Baseline Vocal Quality: Low vocal intensity;Hoarse Volitional Cough: Strong Volitional Swallow: Able to elicit    Oral/Motor/Sensory Function Overall Oral Motor/Sensory Function: Within functional limits   Ice Chips     Thin Liquid Thin Liquid: Impaired Presentation: Cup;Straw Pharyngeal  Phase Impairments: Suspected delayed Swallow;Multiple swallows;Cough - Delayed;Cough - Immediate    Nectar Thick Nectar Thick Liquid: Not tested   Honey Thick Honey Thick Liquid: Not tested   Puree Puree: Within functional limits   Solid     Solid: Within functional limits      Bryan Turner Bryan Mazur MA, CCC-SLP [facility]   08/24/2019,1:02 PM

## 2019-08-24 NOTE — Progress Notes (Signed)
[facility] PROGRESS NOTE  Bryan Turner [id number] DOB: 1941-09-26 DOA: 08/25/2019 PCP: Lawerance Cruel, MD  Assessment/Plan:  #1. Respiratory distress. Improved this am.  likely multifactorial I.e. asthma exacerbation in setting or ?CAP +/- mild chf exacerbation. Tachypnea and DOE and wheezes on presentation. Chest xray bilateral interstitial opacity. COVID 19 neg x2. CRP and LDH elevated. BNP 900. procalcitonin <10 and lactic acid within limits of normal. Hemodynamically stable. Provided with rocephin and azithromycin, solumedrol and nebs. Ambulated this am and sats greater than 90% on room air.  -continue antibiotics for now -follow blood cultures -follow strep urine antigen -continue nebs -continue solumedrol.  -monitor  #2. Diabetes. Controlled. A1c 5.7. home meds include metformin -hold metformin for now -SSI for optimal control -monitor closely as solumedrol will drive glucose up  #3. Acute kidney injury. Creatinine 1.77. likely related to decreased oral intake of late due to acute illness. Received IV fluids.  -hold nephrotoxins -monitor urine output -recheck in am  #4. Elevated BNP. Does not appear overloaded. infact urine appears somewhat dark (clear but dark). No hx chf -stop IV fluids as taking po well -daily weight -monitor closely     Code Status: DNR Family Communication: wife at bedside Disposition Plan: home hopefully tomorrow   Consultants:    Procedures:    Antibiotics:  Rocephin 10/9>>  Azithromycin 10/9>>  HPI/Subjective: Awake alert sitting in chair reports "feeling a whole lot better". States continues with some DOE but generally breathing better. Denies pain/discomfort  Objective: Vitals:   08/24/19 1022 08/24/19 1025  BP: 114/71 108/71  Pulse: 83 73  Resp:  16  Temp:    SpO2: 96% 98%    Intake/Output Summary (Last 24 hours) at 08/24/2019 1031 Last data filed at 08/24/2019 0428 Gross per 24 hour  Intake 1410 ml   Output -  Net 1410 ml   Filed Weights   08/24/19 0130  Weight: 95.6 kg    Exam:   General:  Awake alert HOH no acute distress sitting in chair  Cardiovascular: rrr no mgr no LE edema  Respiratory: normal effort BS somewhat distant on right and faint crackles left base, no wheeze  Abdomen: non-distended non-tender +BS no guarding or rebounding  Musculoskeletal: joints without swelling/erythema   Data Reviewed: Basic Metabolic Panel: Recent Labs  Lab 08/29/2019 1400 08/24/19 0211  NA 137 135  K 4.8 5.1  CL 104 104  CO2 23 18*  GLUCOSE 144* 209*  BUN 33* 31*  CREATININE 1.65* 1.77*  CALCIUM 8.9 8.5*   Liver Function Tests: Recent Labs  Lab 08/24/19 0211  AST 70*  ALT 33  ALKPHOS 49  BILITOT 1.0  PROT 5.9*  ALBUMIN 2.8*   No results for input(s): LIPASE, AMYLASE in the last 168 hours. No results for input(s): AMMONIA in the last 168 hours. CBC: Recent Labs  Lab 09/10/2019 1400 08/24/19 0211  WBC 13.6* 12.7*  NEUTROABS 10.0*  --   HGB 10.5* 9.8*  HCT 31.8* 29.8*  MCV 97.5 96.1  PLT 152 164   Cardiac Enzymes: No results for input(s): CKTOTAL, CKMB, CKMBINDEX, TROPONINI in the last 168 hours. BNP (last 3 results) Recent Labs    09/01/2019 1400  BNP 905.4*    ProBNP (last 3 results) No results for input(s): PROBNP in the last 8760 hours.  CBG: Recent Labs  Lab 08/31/2019 2232 08/24/19 0639  GLUCAP 160* 175*    Recent Results (from the past 240 hour(s))  SARS Coronavirus 2 by RT PCR (hospital order,  performed in [facility] hospital lab) Nasopharyngeal Nasopharyngeal Swab     Status: None   Collection Time: 08/25/2019  2:59 PM   Specimen: Nasopharyngeal Swab  Result Value Ref Range Status   SARS Coronavirus 2 NEGATIVE NEGATIVE Final    Comment: (NOTE) If result is NEGATIVE SARS-CoV-2 target nucleic acids are NOT DETECTED. The SARS-CoV-2 RNA is generally detectable in upper and lower  respiratory specimens during the acute phase of infection.  The lowest  concentration of SARS-CoV-2 viral copies this assay can detect is 250  copies / mL. A negative result does not preclude SARS-CoV-2 infection  and should not be used as the sole basis for treatment or other  patient management decisions.  A negative result may occur with  improper specimen collection / handling, submission of specimen other  than nasopharyngeal swab, presence of viral mutation(s) within the  areas targeted by this assay, and inadequate number of viral copies  (<250 copies / mL). A negative result must be combined with clinical  observations, patient history, and epidemiological information. If result is POSITIVE SARS-CoV-2 target nucleic acids are DETECTED. The SARS-CoV-2 RNA is generally detectable in upper and lower  respiratory specimens dur ing the acute phase of infection.  Positive  results are indicative of active infection with SARS-CoV-2.  Clinical  correlation with patient history and other diagnostic information is  necessary to determine patient infection status.  Positive results do  not rule out bacterial infection or co-infection with other viruses. If result is PRESUMPTIVE POSTIVE SARS-CoV-2 nucleic acids MAY BE PRESENT.   A presumptive positive result was obtained on the submitted specimen  and confirmed on repeat testing.  While 2019 novel coronavirus  (SARS-CoV-2) nucleic acids may be present in the submitted sample  additional confirmatory testing may be necessary for epidemiological  and / or clinical management purposes  to differentiate between  SARS-CoV-2 and other Sarbecovirus currently known to infect humans.  If clinically indicated additional testing with an alternate test  methodology [phone]) is advised. The SARS-CoV-2 RNA is generally  detectable in upper and lower respiratory sp ecimens during the acute  phase of infection. The expected result is Negative. Fact Sheet for Patients:   StrictlyIdeas.no Fact Sheet for Healthcare Providers: BankingDealers.co.za This test is not yet approved or cleared by the [place] FDA and has been authorized for detection and/or diagnosis of SARS-CoV-2 by FDA under an Emergency Use Authorization (EUA).  This EUA will remain in effect (meaning this test can be used) for the duration of the COVID-19 declaration under Section 564(b)(1) of the Act, 21 U.S.C. section 360bbb-3(b)(1), unless the authorization is terminated or revoked sooner. Performed at [facility], [place] [address], [place], [place] [address]   Blood Culture (routine x 2)     Status: None (Preliminary result)   Collection Time: 08/28/2019  3:30 PM   Specimen: BLOOD RIGHT HAND  Result Value Ref Range Status   Specimen Description BLOOD RIGHT HAND  Final   Special Requests   Final    BOTTLES DRAWN AEROBIC AND ANAEROBIC Blood Culture results may not be optimal due to an inadequate volume of blood received in culture bottles   Culture   Final    NO GROWTH < 24 HOURS Performed at [facility], [place] [address], [place], [place] [address]    Report Status PENDING  Incomplete  Blood Culture (routine x 2)     Status: None (Preliminary result)   Collection Time: 08/28/2019  3:40  PM   Specimen: BLOOD LEFT HAND  Result Value Ref Range Status   Specimen Description BLOOD LEFT HAND  Final   Special Requests   Final    BOTTLES DRAWN AEROBIC AND ANAEROBIC Blood Culture results may not be optimal due to an inadequate volume of blood received in culture bottles   Culture   Final    NO GROWTH < 24 HOURS Performed at [place] [address] [place], [place] [address]    Report Status PENDING  Incomplete  Respiratory Panel by PCR     Status: None   Collection Time: 08/24/2019  7:58 PM   Specimen: Nasopharyngeal Swab; Respiratory  Result Value Ref Range Status   Adenovirus NOT DETECTED NOT DETECTED Final    Coronavirus 229E NOT DETECTED NOT DETECTED Final    Comment: (NOTE) The Coronavirus on the Respiratory Panel, DOES NOT test for the novel  Coronavirus (2019 nCoV)    Coronavirus HKU1 NOT DETECTED NOT DETECTED Final   Coronavirus NL63 NOT DETECTED NOT DETECTED Final   Coronavirus OC43 NOT DETECTED NOT DETECTED Final   Metapneumovirus NOT DETECTED NOT DETECTED Final   Rhinovirus / Enterovirus NOT DETECTED NOT DETECTED Final   Influenza A NOT DETECTED NOT DETECTED Final   Influenza B NOT DETECTED NOT DETECTED Final   Parainfluenza Virus 1 NOT DETECTED NOT DETECTED Final   Parainfluenza Virus 2 NOT DETECTED NOT DETECTED Final   Parainfluenza Virus 3 NOT DETECTED NOT DETECTED Final   Parainfluenza Virus 4 NOT DETECTED NOT DETECTED Final   Respiratory Syncytial Virus NOT DETECTED NOT DETECTED Final   Bordetella pertussis NOT DETECTED NOT DETECTED Final   Chlamydophila pneumoniae NOT DETECTED NOT DETECTED Final   Mycoplasma pneumoniae NOT DETECTED NOT DETECTED Final    Comment: Performed at [facility], [place]. [address], [place], [place] [address]  SARS Coronavirus 2 by RT PCR (hospital order, performed in [facility] hospital lab) Nasopharyngeal Nasopharyngeal Swab     Status: None   Collection Time: 09/15/2019  7:58 PM   Specimen: Nasopharyngeal Swab  Result Value Ref Range Status   SARS Coronavirus 2 NEGATIVE NEGATIVE Final    Comment: (NOTE) If result is NEGATIVE SARS-CoV-2 target nucleic acids are NOT DETECTED. The SARS-CoV-2 RNA is generally detectable in upper and lower  respiratory specimens during the acute phase of infection. The lowest  concentration of SARS-CoV-2 viral copies this assay can detect is 250  copies / mL. A negative result does not preclude SARS-CoV-2 infection  and should not be used as the sole basis for treatment or other  patient management decisions.  A negative result may occur with  improper specimen collection / handling, submission of specimen other   than nasopharyngeal swab, presence of viral mutation(s) within the  areas targeted by this assay, and inadequate number of viral copies  (<250 copies / mL). A negative result must be combined with clinical  observations, patient history, and epidemiological information. If result is POSITIVE SARS-CoV-2 target nucleic acids are DETECTED. The SARS-CoV-2 RNA is generally detectable in upper and lower  respiratory specimens dur ing the acute phase of infection.  Positive  results are indicative of active infection with SARS-CoV-2.  Clinical  correlation with patient history and other diagnostic information is  necessary to determine patient infection status.  Positive results do  not rule out bacterial infection or co-infection with other viruses. If result is PRESUMPTIVE POSTIVE SARS-CoV-2 nucleic acids MAY BE PRESENT.   A presumptive positive result  was obtained on the submitted specimen  and confirmed on repeat testing.  While 2019 novel coronavirus  (SARS-CoV-2) nucleic acids may be present in the submitted sample  additional confirmatory testing may be necessary for epidemiological  and / or clinical management purposes  to differentiate between  SARS-CoV-2 and other Sarbecovirus currently known to infect humans.  If clinically indicated additional testing with an alternate test  methodology [phone]) is advised. The SARS-CoV-2 RNA is generally  detectable in upper and lower respiratory sp ecimens during the acute  phase of infection. The expected result is Negative. Fact Sheet for Patients:  StrictlyIdeas.no Fact Sheet for Healthcare Providers: BankingDealers.co.za This test is not yet approved or cleared by the [place] FDA and has been authorized for detection and/or diagnosis of SARS-CoV-2 by FDA under an Emergency Use Authorization (EUA).  This EUA will remain in effect (meaning this test can be used) for the duration of  the COVID-19 declaration under Section 564(b)(1) of the Act, 21 U.S.C. section 360bbb-3(b)(1), unless the authorization is terminated or revoked sooner. Performed at [facility], [place] [address], [place], [place] [address]      Studies: Dg Chest 2 View  Result Date: 09/09/2019 CLINICAL DATA:  Shortness of breath EXAM: CHEST - 2 VIEW COMPARISON:  04/02/2014 FINDINGS: Cardiomegaly. Bilateral interstitial pulmonary opacity, most conspicuous in the perihilar right lung. There may be trace pleural effusions. Disc degenerative disease of the thoracic spine. IMPRESSION: Cardiomegaly. Bilateral interstitial pulmonary opacity, most conspicuous in the perihilar right lung. There may be trace pleural effusions. Findings are consistent with edema or infection. Electronically Signed   By: Eddie Candle M.D.   On: 08/30/2019 14:50    Scheduled Meds: . azithromycin  500 mg Oral Daily  . enoxaparin (LOVENOX) injection  40 mg Subcutaneous Q24H  . insulin aspart  0-5 Units Subcutaneous QHS  . insulin aspart  0-9 Units Subcutaneous TID WC  . ipratropium-albuterol  3 mL Nebulization TID  . methylPREDNISolone (SOLU-MEDROL) injection  40 mg Intravenous Q8H  . mometasone-formoterol  2 puff Inhalation BID   Continuous Infusions: . cefTRIAXone (ROCEPHIN)  IV      Principal Problem:   Respiratory distress Active Problems:   Leukocytosis   Asthma, chronic, unspecified asthma severity, with acute exacerbation   CAP (community acquired pneumonia)   Acute kidney injury ([place])   Type 2 diabetes mellitus without complication ([place])   Diabetes mellitus without complication ([place])   Hard of hearing    Time spent: 69 minutes    [facility] M NP [facility]  If 7PM-7AM, please contact night-coverage at [email or web address], password [facility] 08/24/2019, 10:31 AM  LOS: 0 days

## 2019-08-24 NOTE — Evaluation (Signed)
Physical Therapy Evaluation Patient Details Name: Bryan Turner MRN: [id number] DOB: 10-18-1941 Today's Date: 08/24/2019   History of Present Illness  78 yo admitted with SOB with PNA. PMHx: asthma, DM, macular degeneration  Clinical Impression  Pt pleasant and very willing to mobilize. Pt reports walking .75 miles each day and that he is able to care for himself at home. He walks with a cane outside and furniture walks in home. Pt with decreased balance, strength and activity tolerance with SpO2 >93% on RA throughout session with HR 81-101. Pt will benefit from acute therapy as well as HHPT to maximize balance and safety to decrease fall risk. Recommend use of RW at all times for gait with pt voicing understanding and agreement. Will follow acutely to increase gait, balance and independence.     Follow Up Recommendations Home health PT    Equipment Recommendations  None recommended by PT    Recommendations for Other Services       Precautions / Restrictions Precautions Precautions: Fall      Mobility  Bed Mobility Overal bed mobility: Needs Assistance Bed Mobility: Supine to Sit     Supine to sit: Min assist;HOB elevated     General bed mobility comments: HOB 35 degrees, pt has elevating HOB at home, increased time and struggle with min assist to fully elevate trunk  Transfers Overall transfer level: Needs assistance   Transfers: Sit to/from Stand Sit to Stand: Min guard         General transfer comment: guarding for safety and balance from bed and toilet  Ambulation/Gait Ambulation/Gait assistance: Min assist Gait Distance (Feet): 150 Feet Assistive device: None;Rolling walker (2 wheeled) Gait Pattern/deviations: Step-through pattern;Decreased stride length;Trunk flexed   Gait velocity interpretation: <1.8 ft/sec, indicate of risk for recurrent falls General Gait Details: pt with slightly unsteady gait, reaching for environmental support x 4 during gait with  cues for safety. Slow cautious gait. RW for 10' after return to room with increased stability with cues for proximity  Stairs            Wheelchair Mobility    Modified Rankin (Stroke Patients Only)       Balance Overall balance assessment: Needs assistance   Sitting balance-Leahy Scale: Fair       Standing balance-Leahy Scale: Fair Standing balance comment: pt able to stand at sink and walk without RW but balance deficits notable                             Pertinent Vitals/Pain Pain Assessment: No/denies pain    Home Living Family/patient expects to be discharged to:: Private residence Living Arrangements: Spouse/significant other Available Help at Discharge: Family;Available 24 hours/day Type of Home: House Home Access: Stairs to enter   [employer] of Steps: 1 Home Layout: One level Home Equipment: Walker - 2 wheels;Cane - single point;Toilet riser;Grab bars - tub/shower;Shower seat      Prior Function Level of Independence: Independent with assistive device(s)         Comments: uses cane out of house, furniture walks at home. gets into tub 3x/wk, drives, walks .75 miles daily     Hand Dominance        Extremity/Trunk Assessment   Upper Extremity Assessment Upper Extremity Assessment: Generalized weakness    Lower Extremity Assessment Lower Extremity Assessment: Generalized weakness    Cervical / Trunk Assessment Cervical / Trunk Assessment: Kyphotic  Communication   Communication: [facility]  Cognition Arousal/Alertness: Awake/alert Behavior During Therapy: WFL for tasks assessed/performed Overall Cognitive Status: Impaired/Different from baseline Area of Impairment: Safety/judgement                         Safety/Judgement: Decreased awareness of deficits;Decreased awareness of safety     General Comments: pt reportedly doesn't use RW and does not have insight into his balance deficits      General  Comments      Exercises     Assessment/Plan    PT Assessment Patient needs continued PT services  PT Problem List Decreased mobility;Decreased activity tolerance;Decreased safety awareness;Decreased balance;Decreased knowledge of use of DME       PT Treatment Interventions Gait training;Therapeutic exercise;Patient/family education;Stair training;Functional mobility training;Balance training;Therapeutic activities;DME instruction;Cognitive remediation    PT Goals (Current goals can be found in the Care Plan section)  Acute Rehab PT Goals Patient Stated Goal: return home, watch tv, walk PT Goal Formulation: With patient Time For Goal Achievement: 09/07/19 Potential to Achieve Goals: Good    Frequency Min 3X/week   Barriers to discharge Decreased caregiver support      Co-evaluation               AM-PAC PT "6 Clicks" Mobility  Outcome Measure Help needed turning from your back to your side while in a flat bed without using bedrails?: A Little Help needed moving from lying on your back to sitting on the side of a flat bed without using bedrails?: A Little Help needed moving to and from a bed to a chair (including a wheelchair)?: A Little Help needed standing up from a chair using your arms (e.g., wheelchair or bedside chair)?: A Little Help needed to walk in hospital room?: A Little Help needed climbing 3-5 steps with a railing? : A Little 6 Click Score: 18    End of Session Equipment Utilized During Treatment: Gait belt Activity Tolerance: Patient tolerated treatment well Patient left: in chair;with chair alarm set;with call bell/phone within reach Nurse Communication: Mobility status PT Visit Diagnosis: Other abnormalities of gait and mobility (R26.89);History of falling (Z91.81)    Time: [id number] PT Time Calculation (min) (ACUTE ONLY): 25 min   Charges:   PT Evaluation $PT Eval Moderate Complexity: 1 Mod PT Treatments $Gait Training: 8-22 mins         Leilanni Halvorson Pam Drown, PT Acute Rehabilitation Services Pager: [phone] Office: [place] 08/24/2019, 8:55 AM

## 2019-08-24 NOTE — ED Notes (Signed)
ED TO INPATIENT HANDOFF REPORT  ED Nurse Name and Phone #:  [phone]  S Name/Age/Gender Bryan Turner 78 y.o. male Room/Bed: 015C/015C  Code Status   Code Status: DNR  Home/SNF/Other Home Patient oriented to: self, place, time and situation Is this baseline? Yes   Triage Complete: Triage complete  Chief Complaint [facility]  Triage Note Pt arrives to ED from home home with complaints of shortness of breath, weakness, and cough since Sunday. Patient states they called EMS today because patients temperature was 99.63F. On EMS arrival to patient he was in an Afib rhythm, RR was 30's, and BP was 92/47. Patient states he has had right sided just pain on and off since Tuesday as well.    Allergies Allergies  Allergen Reactions  . Aspirin     Bleeds easily  . Codeine Swelling    Throat swelling  . Prednisone   . Tamiflu [Oseltamivir] Other (See Comments)    Hallucinations    Level of Care/Admitting Diagnosis ED Disposition    ED Disposition Condition [facility] Area: [place] [[id number]]  Level of Care: Med-Surg [16]  I expect the patient will be discharged within 24 hours: No (not a candidate for 5C-Observation unit)  Covid Evaluation: N/A  Diagnosis: CAP (community acquired pneumoniaUT:[id number]  Admitting Physician: Rise Patience [id number]  Attending Physician: Rise Patience [[id number]]  PT Class (Do Not Modify): Observation [104]  PT Acc Code (Do Not Modify): Observation [10022]       B Medical/Surgery History Past Medical History:  Diagnosis Date  . Asthma   . Diabetes mellitus without complication ([place])   . Hard of hearing    wears hearing aides  . Macular degeneration   . Plantar fasciitis    Past Surgical History:  Procedure Laterality Date  . CHOLECYSTECTOMY N/A 03/28/2014   Procedure: LAPAROSCOPIC CHOLECYSTECTOMY WITH INTRAOPERATIVE CHOLANGIOGRAM;  Surgeon: [name], MD;  Location: [facility];   Service: General;  Laterality: N/A;  . EYE SURGERY    . HEMORRHOID SURGERY       A IV Location/Drains/Wounds Patient Lines/Drains/Airways Status   Active Line/Drains/Airways    Name:   Placement date:   Placement time:   Site:   Days:   Peripheral IV 08/18/2019 Right Antecubital   09/07/2019    1405    Antecubital   1          Intake/Output Last 24 hours  Intake/Output Summary (Last 24 hours) at 08/24/2019 0054 Last data filed at 09/14/2019 1833 Gross per 24 hour  Intake 1350 ml  Output -  Net 1350 ml    Labs/Imaging Results for orders placed or performed during the hospital encounter of 08/16/2019 (from the past 48 hour(s))  Basic metabolic panel     Status: Abnormal   Collection Time: 08/29/2019  2:00 PM  Result Value Ref Range   Sodium 137 135 - 145 mmol/L   Potassium 4.8 3.5 - 5.1 mmol/L   Chloride 104 98 - 111 mmol/L   CO2 23 22 - 32 mmol/L   Glucose, Bld 144 (H) 70 - 99 mg/dL   BUN 33 (H) 8 - 23 mg/dL   Creatinine, Ser 1.65 (H) 0.61 - 1.24 mg/dL   Calcium 8.9 8.9 - 10.3 mg/dL   GFR calc non Af Amer 39 (L) >60 mL/min   GFR calc Af Amer 45 (L) >60 mL/min   Anion gap 10 5 - 15    Comment: Performed  at [facility], [place] [address], [place], [place] [address]  CBC with Differential     Status: Abnormal   Collection Time: 09/10/2019  2:00 PM  Result Value Ref Range   WBC 13.6 (H) 4.0 - 10.5 K/uL   RBC 3.26 (L) 4.22 - 5.81 MIL/uL   Hemoglobin 10.5 (L) 13.0 - 17.0 g/dL   HCT 31.8 (L) 39.0 - 52.0 %   MCV 97.5 80.0 - 100.0 fL   MCH 32.2 26.0 - 34.0 pg   MCHC 33.0 30.0 - 36.0 g/dL   RDW 13.5 11.5 - 15.5 %   Platelets 152 150 - 400 K/uL   nRBC 0.0 0.0 - 0.2 %   Neutrophils Relative % 74 %   Neutro Abs 10.0 (H) 1.7 - 7.7 K/uL   Lymphocytes Relative 12 %   Lymphs Abs 1.7 0.7 - 4.0 K/uL   Monocytes Relative 13 %   Monocytes Absolute 1.8 (H) 0.1 - 1.0 K/uL   Eosinophils Relative 0 %   Eosinophils Absolute 0.0 0.0 - 0.5 K/uL   Basophils Relative 0 %   Basophils  Absolute 0.0 0.0 - 0.1 K/uL   Immature Granulocytes 1 %   Abs Immature Granulocytes 0.07 0.00 - 0.07 K/uL    Comment: Performed at [facility], [place] [address] [place], [place] [address]  Brain natriuretic peptide     Status: Abnormal   Collection Time: 08/25/2019  2:00 PM  Result Value Ref Range   B Natriuretic Peptide 905.4 (H) 0.0 - 100.0 pg/mL    Comment: Performed at [facility] [address] [place], [place] [address]  Lactic acid, plasma     Status: None   Collection Time: 09/05/2019  2:59 PM  Result Value Ref Range   Lactic Acid, Venous 1.4 0.5 - 1.9 mmol/L    Comment: Performed at [facility] [address] [place],  [address]  Procalcitonin     Status: None   Collection Time: 08/24/2019  2:59 PM  Result Value Ref Range   Procalcitonin <0.10 ng/mL    Comment:        Interpretation: PCT (Procalcitonin) <= 0.5 ng/mL: Systemic infection (sepsis) is not likely. Local bacterial infection is possible. (NOTE)       Sepsis PCT Algorithm           Lower Respiratory Tract                                      Infection PCT Algorithm    ----------------------------     ----------------------------         PCT < 0.25 ng/mL                PCT < 0.10 ng/mL         Strongly encourage             Strongly discourage   discontinuation of antibiotics    initiation of antibiotics    ----------------------------     -----------------------------       PCT 0.25 - 0.50 ng/mL            PCT 0.10 - 0.25 ng/mL               OR       >80% decrease in PCT            Discourage initiation of  antibiotics      Encourage discontinuation           of antibiotics    ----------------------------     -----------------------------         PCT >= 0.50 ng/mL              PCT 0.26 - 0.50 ng/mL               AND        <80% decrease in PCT             Encourage initiation of                                             antibiotics        Encourage continuation           of antibiotics    ----------------------------     -----------------------------        PCT >= 0.50 ng/mL                  PCT > 0.50 ng/mL               AND         increase in PCT                  Strongly encourage                                      initiation of antibiotics    Strongly encourage escalation           of antibiotics                                     -----------------------------                                           PCT <= 0.25 ng/mL                                                 OR                                        > 80% decrease in PCT                                     Discontinue / Do not initiate                                             antibiotics Performed at [facility], [place] [address] [place], [place] [address]   Lactate dehydrogenase     Status: Abnormal   Collection Time: 08/16/2019  2:59 PM  Result Value Ref Range   LDH 492 (H) 98 - 192 U/L    Comment: Performed at [facility], [place] [address], [place], [place] [address]  Ferritin     Status: Abnormal   Collection Time: 09/10/2019  2:59 PM  Result Value Ref Range   Ferritin 644 (H) 24 - 336 ng/mL    Comment: Performed at [facility], [place] [address], [place], [place] [address]  C-reactive protein     Status: Abnormal   Collection Time:   2:59 PM  Result Value Ref Range   CRP 11.9 (H) <1.0 mg/dL    Comment: Performed at [facility], [place] [address] [place], [place] [address]  SARS Coronavirus 2 by RT PCR (hospital order, performed in [facility] hospital lab) Nasopharyngeal Nasopharyngeal Swab     Status: None   Collection Time: 08/22/2019  2:59 PM   Specimen: Nasopharyngeal Swab  Result Value Ref Range   SARS Coronavirus 2 NEGATIVE NEGATIVE    Comment: (NOTE) If result is NEGATIVE SARS-CoV-2 target nucleic acids are NOT DETECTED. The SARS-CoV-2 RNA is generally detectable in upper and lower  respiratory specimens  during the acute phase of infection. The lowest  concentration of SARS-CoV-2 viral copies this assay can detect is 250  copies / mL. A negative result does not preclude SARS-CoV-2 infection  and should not be used as the sole basis for treatment or other  patient management decisions.  A negative result may occur with  improper specimen collection / handling, submission of specimen other  than nasopharyngeal swab, presence of viral mutation(s) within the  areas targeted by this assay, and inadequate number of viral copies  (<250 copies / mL). A negative result must be combined with clinical  observations, patient history, and epidemiological information. If result is POSITIVE SARS-CoV-2 target nucleic acids are DETECTED. The SARS-CoV-2 RNA is generally detectable in upper and lower  respiratory specimens dur ing the acute phase of infection.  Positive  results are indicative of active infection with SARS-CoV-2.  Clinical  correlation with patient history and other diagnostic information is  necessary to determine patient infection status.  Positive results do  not rule out bacterial infection or co-infection with other viruses. If result is PRESUMPTIVE POSTIVE SARS-CoV-2 nucleic acids MAY BE PRESENT.   A presumptive positive result was obtained on the submitted specimen  and confirmed on repeat testing.  While 2019 novel coronavirus  (SARS-CoV-2) nucleic acids may be present in the submitted sample  additional confirmatory testing may be necessary for epidemiological  and / or clinical management purposes  to differentiate between  SARS-CoV-2 and other Sarbecovirus currently known to infect humans.  If clinically indicated additional testing with an alternate test  methodology [phone]) is advised. The SARS-CoV-2 RNA is generally  detectable in upper and lower respiratory sp ecimens during the acute  phase of infection. The expected result is Negative. Fact Sheet for Patients:   StrictlyIdeas.no Fact Sheet for Healthcare Providers: BankingDealers.co.za This test is not yet approved or cleared by the [place] FDA and has been authorized for detection and/or diagnosis of SARS-CoV-2 by FDA under an Emergency Use Authorization (EUA).  This EUA will remain in effect (meaning this test can be used) for the duration of the COVID-19 declaration under Section 564(b)(1) of the Act, 21 U.S.C. section 360bbb-3(b)(1), unless the authorization is terminated or revoked sooner. Performed at [facility], [place] [address] [place], [place] [address]  Triglycerides     Status: None   Collection Time: 09/02/2019  2:59 PM  Result Value Ref Range   Triglycerides 64 <150 mg/dL    Comment: Performed at [facility] [address], [place], [place] [address]  Respiratory Panel by PCR     Status: None   Collection Time:   7:58 PM   Specimen: Nasopharyngeal Swab; Respiratory  Result Value Ref Range   Adenovirus NOT DETECTED NOT DETECTED   Coronavirus 229E NOT DETECTED NOT DETECTED    Comment: (NOTE) The Coronavirus on the Respiratory Panel, DOES NOT test for the novel  Coronavirus (2019 nCoV)    Coronavirus HKU1 NOT DETECTED NOT DETECTED   Coronavirus NL63 NOT DETECTED NOT DETECTED   Coronavirus OC43 NOT DETECTED NOT DETECTED   Metapneumovirus NOT DETECTED NOT DETECTED   Rhinovirus / Enterovirus NOT DETECTED NOT DETECTED   Influenza A NOT DETECTED NOT DETECTED   Influenza B NOT DETECTED NOT DETECTED   Parainfluenza Virus 1 NOT DETECTED NOT DETECTED   Parainfluenza Virus 2 NOT DETECTED NOT DETECTED   Parainfluenza Virus 3 NOT DETECTED NOT DETECTED   Parainfluenza Virus 4 NOT DETECTED NOT DETECTED   Respiratory Syncytial Virus NOT DETECTED NOT DETECTED   Bordetella pertussis NOT DETECTED NOT DETECTED   Chlamydophila pneumoniae NOT DETECTED NOT DETECTED   Mycoplasma pneumoniae NOT DETECTED NOT DETECTED     Comment: Performed at [facility], [place] [address], [place], [place] [address]  SARS Coronavirus 2 by RT PCR (hospital order, performed in [facility] hospital lab) Nasopharyngeal Nasopharyngeal Swab     Status: None   Collection Time: 08/28/2019  7:58 PM   Specimen: Nasopharyngeal Swab  Result Value Ref Range   SARS Coronavirus 2 NEGATIVE NEGATIVE    Comment: (NOTE) If result is NEGATIVE SARS-CoV-2 target nucleic acids are NOT DETECTED. The SARS-CoV-2 RNA is generally detectable in upper and lower  respiratory specimens during the acute phase of infection. The lowest  concentration of SARS-CoV-2 viral copies this assay can detect is 250  copies / mL. A negative result does not preclude SARS-CoV-2 infection  and should not be used as the sole basis for treatment or other  patient management decisions.  A negative result may occur with  improper specimen collection / handling, submission of specimen other  than nasopharyngeal swab, presence of viral mutation(s) within the  areas targeted by this assay, and inadequate number of viral copies  (<250 copies / mL). A negative result must be combined with clinical  observations, patient history, and epidemiological information. If result is POSITIVE SARS-CoV-2 target nucleic acids are DETECTED. The SARS-CoV-2 RNA is generally detectable in upper and lower  respiratory specimens dur ing the acute phase of infection.  Positive  results are indicative of active infection with SARS-CoV-2.  Clinical  correlation with patient history and other diagnostic information is  necessary to determine patient infection status.  Positive results do  not rule out bacterial infection or co-infection with other viruses. If result is PRESUMPTIVE POSTIVE SARS-CoV-2 nucleic acids MAY BE PRESENT.   A presumptive positive result was obtained on the submitted specimen  and confirmed on repeat testing.  While 2019 novel coronavirus  (SARS-CoV-2) nucleic acids  may be present in the submitted sample  additional confirmatory testing may be necessary for epidemiological  and / or clinical management purposes  to differentiate between  SARS-CoV-2 and other Sarbecovirus currently known to infect humans.  If clinically indicated additional testing with an alternate  test  methodology [phone]) is advised. The SARS-CoV-2 RNA is generally  detectable in upper and lower respiratory sp ecimens during the acute  phase of infection. The expected result is Negative. Fact Sheet for Patients:  StrictlyIdeas.no Fact Sheet for Healthcare Providers: BankingDealers.co.za This test is not yet approved or cleared by the [place] FDA and has been authorized for detection and/or diagnosis of SARS-CoV-2 by FDA under an Emergency Use Authorization (EUA).  This EUA will remain in effect (meaning this test can be used) for the duration of the COVID-19 declaration under Section 564(b)(1) of the Act, 21 U.S.C. section 360bbb-3(b)(1), unless the authorization is terminated or revoked sooner. Performed at [facility], [place] [address], [place], [place] [address]   Lactic acid, plasma     Status: None   Collection Time: 09/10/2019  8:30 PM  Result Value Ref Range   Lactic Acid, Venous 1.4 0.5 - 1.9 mmol/L    Comment: Performed at [facility] [address], [place], [place] [address]  CBG monitoring, ED     Status: Abnormal   Collection Time: 09/11/2019 10:32 PM  Result Value Ref Range   Glucose-Capillary 160 (H) 70 - 99 mg/dL   Dg Chest 2 View  Result Date: 08/31/2019 CLINICAL DATA:  Shortness of breath EXAM: CHEST - 2 VIEW COMPARISON:  04/02/2014 FINDINGS: Cardiomegaly. Bilateral interstitial pulmonary opacity, most conspicuous in the perihilar right lung. There may be trace pleural effusions. Disc degenerative disease of the thoracic spine. IMPRESSION: Cardiomegaly. Bilateral interstitial pulmonary opacity,  most conspicuous in the perihilar right lung. There may be trace pleural effusions. Findings are consistent with edema or infection. Electronically Signed   By: Eddie Candle M.D.   On: 09/06/2019 14:50    Pending Labs Unresulted Labs (From admission, onward)    Start     Ordered   08/30/19 0500  Creatinine, serum  (enoxaparin (LOVENOX)    CrCl >/= 30 ml/min)  Weekly,   R    Comments: while on enoxaparin therapy    08/30/2019 1733   08/24/19 0500  Hemoglobin A1c  Tomorrow morning,   R    Comments: To assess prior glycemic control    09/09/2019 1733   08/24/19 0500  CBC  Tomorrow morning,   R     09/06/2019 1733   08/24/19 0500  Comprehensive metabolic panel  Tomorrow morning,   R     09/07/2019 1733   08/24/19 0500  C-reactive protein  Tomorrow morning,   R     09/02/2019 1734   08/24/19 0500  Lactate dehydrogenase  Tomorrow morning,   R     08/18/2019 1734   08/24/19 0500  Ferritin  Tomorrow morning,   R     08/29/2019 1734   09/09/2019 1726  Strep pneumoniae urinary antigen  Once,   STAT     09/02/2019 1733   09/12/2019 1726  Legionella Pneumophila Serogp 1 Ur Ag  Once,   STAT     08/27/2019 1733   09/08/2019 1433  Blood Culture (routine x 2)  BLOOD CULTURE X 2,   STAT     09/12/2019 1434   09/01/2019 1401  Urinalysis, Routine w reflex microscopic  Once,   STAT     08/18/2019 1400          Vitals/Pain Today's Vitals   08/26/2019 2100 08/30/2019 2156 09/06/2019 2300 08/24/19 0000  BP: 110/64  120/66 125/69  Pulse: 88  80 79  Resp: (!) 22  (!)  23 (!) 22  Temp: 98.7 F (37.1 C)     TempSrc: Oral     SpO2: 97%  94% 95%  PainSc: 0-No pain 0-No pain      Isolation Precautions No active isolations  Medications Medications  albuterol (VENTOLIN HFA) 108 (90 Base) MCG/ACT inhaler 4 puff (4 puffs Inhalation Given 08/24/2019 1651)  traMADol (ULTRAM) tablet 50 mg (has no administration in time range)  insulin aspart (novoLOG) injection 0-9 Units (has no administration in time range)  insulin aspart (novoLOG)  injection 0-5 Units (0 Units Subcutaneous Not Given 09/01/2019 2235)  enoxaparin (LOVENOX) injection 40 mg (40 mg Subcutaneous Not Given 08/22/2019 1940)  0.9 %  sodium chloride infusion ( Intravenous New Bag/Given 08/19/2019 1903)  azithromycin (ZITHROMAX) tablet 500 mg (has no administration in time range)  methylPREDNISolone sodium succinate (SOLU-MEDROL) 40 mg/mL injection 40 mg (40 mg Intravenous Given 08/18/2019 1947)  mometasone-formoterol (DULERA) 100-5 MCG/ACT inhaler 2 puff (2 puffs Inhalation Not Given 08/29/2019 1920)  cefTRIAXone (ROCEPHIN) 2 g in sodium chloride 0.9 % 100 mL IVPB (has no administration in time range)  ipratropium-albuterol (DUONEB) 0.5-2.5 (3) MG/3ML nebulizer solution 3 mL (3 mLs Nebulization Given 08/19/2019 1917)  sodium chloride 0.9 % bolus 1,000 mL (0 mLs Intravenous Stopped 08/20/2019 1600)  cefTRIAXone (ROCEPHIN) 1 g in sodium chloride 0.9 % 100 mL IVPB (0 g Intravenous Stopped 08/24/2019 1726)  azithromycin (ZITHROMAX) 500 mg in sodium chloride 0.9 % 250 mL IVPB (0 mg Intravenous Stopped 09/07/2019 1833)    Mobility walks High fall risk(Pt is a (2) person assist to stand)   Focused Assessments Cardiac Assessment Handoff:  Cardiac Rhythm: Normal sinus rhythm, Bundle branch block Lab Results  Component Value Date   TROPONINI <0.30 11/16/2013   No results found for: DDIMER Does the Patient currently have chest pain? No     R Recommendations: See Admitting Provider Note  Report given to:   Additional Notes:  Pt is suppose to use a walker, but rarely does. He is a (2) person assist and very weak. Lives at home with his wife.

## 2019-08-24 NOTE — Progress Notes (Signed)
Modified Barium Swallow Progress Note  Patient Details  Name: Bryan Turner MRN: [id number] Date of Birth: 11/09/1941  Today's Date: 08/24/2019  Modified Barium Swallow completed.  Full report located under Chart Review in the Imaging Section.  Brief recommendations include the following:  Clinical Impression  Pt presents with a mild to moderate pharyngeal dysphagia of unknown etiology. Deficits c/b reduced base of tongue retraction, decreased laryngeal elevation, reduced timing and efficiency of laryngeal vestibule closure, decreased glottic closure, and diminished sensation. This allowed for: - frequent intermittent silent penetration of thin liquids before the swallow, trace silent aspiration after the swallow - gross silent penetration and aspiration of thin liquids via straw use before the swallow.  Pt with mild vallecular and pyriform sinus residuals that decreased with second swallows. Recommend nectar thick liquids and regular consistencies with medicines in puree and safe swallow precautions. Pt okay for water in between meals following oral care. Educated pt and spouse regarding recommendations and findings. SLP to follow up for further management.    Swallow Evaluation Recommendations       SLP Diet Recommendations: Nectar thick liquid;Regular solids   Liquid Administration via: Cup;No straw   Medication Administration: Whole meds with puree   Supervision: Patient able to self feed;Intermittent supervision to cue for compensatory strategies   Compensations: Slow rate;Small sips/bites;Multiple dry swallows after each bite/sip;Clear throat intermittently   Postural Changes: Seated upright at 90 degrees;Remain semi-upright after after feeds/meals (Comment)   Oral Care Recommendations: Oral care BID   Other Recommendations: Order thickener from Brock Hall MA, Lane  08/24/2019,2:49 PM

## 2019-08-24 NOTE — Plan of Care (Signed)
  Problem: Clinical Measurements: Goal: Will remain free from infection Outcome: Progressing Goal: Respiratory complications will improve Outcome: Progressing   Problem: Safety: Goal: Ability to remain free from injury will improve Outcome: Progressing   

## 2019-08-24 NOTE — Plan of Care (Signed)
  Problem: Education: Goal: Knowledge of General Education information will improve Description: Including pain rating scale, medication(s)/side effects and non-pharmacologic comfort measures Outcome: Progressing   Problem: Clinical Measurements: Goal: Will remain free from infection Outcome: Progressing   Problem: Coping: Goal: Level of anxiety will decrease Outcome: Progressing   Problem: Pain Managment: Goal: General experience of comfort will improve Outcome: Progressing   Problem: Safety: Goal: Ability to remain free from injury will improve Outcome: Progressing

## 2019-08-25 ENCOUNTER — Inpatient Hospital Stay (HOSPITAL_COMMUNITY): Payer: Medicare Other

## 2019-08-25 LAB — BASIC METABOLIC PANEL
Anion gap: 15 (ref 5–15)
BUN: 54 mg/dL — ABNORMAL HIGH (ref 8–23)
CO2: 18 mmol/L — ABNORMAL LOW (ref 22–32)
Calcium: 8.6 mg/dL — ABNORMAL LOW (ref 8.9–10.3)
Chloride: 107 mmol/L (ref 98–111)
Creatinine, Ser: 1.96 mg/dL — ABNORMAL HIGH (ref 0.61–1.24)
GFR calc Af Amer: 37 mL/min — ABNORMAL LOW (ref >60)
GFR calc non Af Amer: 32 mL/min — ABNORMAL LOW (ref >60)
Glucose, Bld: 268 mg/dL — ABNORMAL HIGH (ref 70–99)
Potassium: 4.7 mmol/L (ref 3.5–5.1)
Sodium: 140 mmol/L (ref 135–145)

## 2019-08-25 LAB — GLUCOSE, CAPILLARY
Glucose-Capillary: 225 mg/dL — ABNORMAL HIGH (ref 70–99)
Glucose-Capillary: 302 mg/dL — ABNORMAL HIGH (ref 70–99)
Glucose-Capillary: 285 mg/dL — ABNORMAL HIGH (ref 70–99)
Glucose-Capillary: 268 mg/dL — ABNORMAL HIGH (ref 70–99)

## 2019-08-25 MED ORDER — PANTOPRAZOLE SODIUM 40 MG IV SOLR: 40.0000 mg | INTRAVENOUS | Status: DC

## 2019-08-25 MED ORDER — DEXAMETHASONE 4 MG PO TABS: 4.0000 mg | ORAL_TABLET | Freq: Every day | ORAL | 0 refills | Status: AC

## 2019-08-25 MED ORDER — ALBUTEROL SULFATE HFA 108 (90 BASE) MCG/ACT IN AERS: 1.0000 | INHALATION_SPRAY | Freq: Four times a day (QID) | RESPIRATORY_TRACT | 0 refills | Status: AC | PRN

## 2019-08-25 MED ORDER — ALBUTEROL SULFATE HFA 108 (90 BASE) MCG/ACT IN AERS: 1.0000 | INHALATION_SPRAY | Freq: Four times a day (QID) | RESPIRATORY_TRACT | 0 refills | Status: DC | PRN

## 2019-08-25 MED ORDER — RESOURCE THICKENUP CLEAR PO POWD: ORAL | Status: AC

## 2019-08-25 MED ORDER — AMOXICILLIN-POT CLAVULANATE 500-125 MG PO TABS
1.0000 | ORAL_TABLET | Freq: Two times a day (BID) | ORAL | Status: DC
  Administered 2019-08-26 (×2): 500 mg via ORAL
  Filled 2019-08-25 (×4): qty 1

## 2019-08-25 MED ORDER — SODIUM CHLORIDE 0.9 % IV SOLN
INTRAVENOUS | Status: DC
  Administered 2019-08-25 – 2019-08-27 (×3): via INTRAVENOUS

## 2019-08-25 MED ORDER — LORAZEPAM 2 MG/ML IJ SOLN
0.5000 mg | Freq: Four times a day (QID) | INTRAMUSCULAR | Status: DC | PRN
  Administered 2019-08-25 – 2019-08-26 (×3): 0.5 mg via INTRAVENOUS
  Filled 2019-08-25 (×3): qty 1

## 2019-08-25 MED ORDER — DOXYCYCLINE HYCLATE 100 MG PO TABS: 100.0000 mg | ORAL_TABLET | Freq: Two times a day (BID) | ORAL | 0 refills | Status: AC

## 2019-08-25 MED ORDER — PANTOPRAZOLE SODIUM 40 MG PO TBEC
40.0000 mg | DELAYED_RELEASE_TABLET | Freq: Every day | ORAL | Status: DC
  Administered 2019-08-25 – 2019-09-01 (×7): 40 mg via ORAL
  Filled 2019-08-25 (×8): qty 1

## 2019-08-25 MED ORDER — DOXYCYCLINE HYCLATE 100 MG PO TABS
100.0000 mg | ORAL_TABLET | Freq: Two times a day (BID) | ORAL | Status: DC
  Administered 2019-08-25: 10:00:00 100 mg via ORAL
  Filled 2019-08-25: qty 1

## 2019-08-25 MED ORDER — IPRATROPIUM-ALBUTEROL 0.5-2.5 (3) MG/3ML IN SOLN: 3.0000 mL | Freq: Two times a day (BID) | RESPIRATORY_TRACT | 0 refills | Status: AC

## 2019-08-25 MED ORDER — IPRATROPIUM-ALBUTEROL 0.5-2.5 (3) MG/3ML IN SOLN: 3.0000 mL | Freq: Two times a day (BID) | RESPIRATORY_TRACT | 0 refills | Status: DC

## 2019-08-25 NOTE — Progress Notes (Signed)
Notified [facility], Psychologist, clinical (on-call), of patients MEWS score 3 in relation to respirations and pulse.   Also notified [facility], [place] (on-call), of patient's refusal of Prednisone. Patient states that Prednisone has a side effect of hallucinations.

## 2019-08-25 NOTE — Progress Notes (Signed)
Notified Dr. Eliseo Squires about patient's oxygen level. Without oxygen patient c/o SOB, oxygen level 89-90%. Placed patient on 2L [place], saturation level 95%, patient states he can breath better.

## 2019-08-25 NOTE — TOC Progression Note (Signed)
Transition of Care [facility] - Progression Note    Patient Details  Name: Bryan Turner MRN: [id number] Date of Birth: Sep 29, 1941  Transition of [facility] CM/SW Contact  Bartholomew Crews, RN Phone Number: [phone] 08/25/2019, 2:42 PM  Clinical Narrative:    Received message from ST recommending [place]. MD notified of recommendation and will add to [facility] order. Noted recommendation for RW - patient already has at home. Following for transition of care needs.         Expected Discharge Plan and Services           Expected Discharge Date: 08/25/19                                     Social Determinants of Health (SDOH) Interventions    Readmission Risk Interventions No flowsheet data found.

## 2019-08-25 NOTE — Progress Notes (Signed)
  Speech Language Pathology Treatment: Dysphagia  Patient Details Name: Bryan Turner MRN: [id number] DOB: 06-02-1941 Today's Date: 08/25/2019 Time: 1350-1410 SLP Time Calculation (min) (ACUTE ONLY): 20 min  Assessment / Plan / Recommendation Clinical Impression  Pt was encountered lying semi-reclined in bed with wife present at bedside.  Pt reported that he had been tolerating his current diet without difficulty until lunch this afternoon.  He stated that he began having some SOB following the completion of his meal.  Pt politely refused po trials during this session secondary to this.  Pt additionally reported that he felt feverish during this session, and he asked for the RN to take his temperature.  Pt and wife were thoroughly educated regarding results from [facility], diet recommendations, commercial thickeners, [employer] Protocol, and aspiration precautions.  Recommend continuation of regular solids and nectar-thick liquid with intermittent supervision to encourage use of compensatory strategies.  Pt would benefit from additional ST 3x weekly (home health) targeting dysphagia at time of discharge.     HPI HPI: 78 yo admitted with SOB with PNA. PMHx: asthma, DM, macular degeneration      SLP Plan  Continue with current plan of care       Recommendations  Diet recommendations: Nectar-thick liquid;Regular Liquids provided via: Cup Medication Administration: Whole meds with puree Supervision: Intermittent supervision to cue for compensatory strategies Compensations: Slow rate;Small sips/bites;Multiple dry swallows after each bite/sip;Clear throat intermittently Postural Changes and/or Swallow Maneuvers: Seated upright 90 degrees                Oral Care Recommendations: Oral care BID Follow up Recommendations: Home health SLP SLP Visit Diagnosis: Dysphagia, pharyngeal phase (R13.13) Plan: Continue with current plan of care       Bretta Bang, M.S., [place] Office: [phone]               [place] 08/25/2019, 2:17 PM

## 2019-08-25 NOTE — TOC Initial Note (Addendum)
Transition of Care [facility]) - Initial/Assessment Note    Patient Details  Name: Bryan Turner MRN: [id number] Date of Birth: 05/25/1941  [facility] CM/SW Contact:    Bartholomew Crews, RN Phone Number: [phone] 08/25/2019, 1:08 PM  Clinical Narrative:                 Spoke with patient and spouse at bedside. Discussed need for nebulizer at home. DME order provided. Referred to [employer] who will deliver to room. Offered choice list for [facility] agency - referral placed to [facility] - pending. Patient to transition home today.     Update: Referral accepted by [facility]. [employer] to deliver nebulizer to bedside.      Patient Goals and CMS Choice        Expected Discharge Plan and Services           Expected Discharge Date: 08/25/19                                    Prior Living Arrangements/Services                       Activities of Daily Living Home Assistive Devices/Equipment: Kasandra Knudsen (specify quad or straight)(straight) ADL Screening (condition at time of admission) Patient's cognitive ability adequate to safely complete daily activities?: Yes Is the patient deaf or have difficulty hearing?: Yes Does the patient have difficulty seeing, even when wearing glasses/contacts?: No Does the patient have difficulty concentrating, remembering, or making decisions?: No Patient able to express need for assistance with ADLs?: No Does the patient have difficulty dressing or bathing?: No Independently performs ADLs?: Yes (appropriate for developmental age) Does the patient have difficulty walking or climbing stairs?: No Weakness of Legs: None Weakness of Arms/Hands: None  Permission Sought/Granted                  Emotional Assessment              Admission diagnosis:  CAP (community acquired pneumonia) [J18.9] Patient Active Problem List   Diagnosis Date Noted  . CAP (community acquired pneumonia) 08/24/2019  . Acute kidney injury ([place]) 08/24/2019   . Respiratory distress 08/24/2019  . Hard of hearing   . Diabetes mellitus without complication ([place])   . Pneumonia 08/22/2019  . Leukocytosis 09/12/2019  . Type 2 diabetes mellitus without complication ([place]) [id number]  . Asthma, chronic, unspecified asthma severity, with acute exacerbation 09/15/2019  . Cholecystitis 03/28/2014  . Cholelithiasis and acute cholecystitis without obstruction 03/28/2014   PCP:  Lawerance Cruel, MD Pharmacy:   [facility] [place], [place] - [address] [place] [address] [place] [address] Phone: [phone] Fax: [phone]     Social Determinants of Health (SDOH) Interventions    Readmission Risk Interventions No flowsheet data found.

## 2019-08-25 NOTE — Progress Notes (Signed)
[facility] PROGRESS NOTE  Bryan Turner [id number] DOB: Nov 15, 1941 DOA: 09/11/2019 PCP: Lawerance Cruel, MD  Assessment/Plan:  Respiratory distress:  likely multifactorial I.e. asthma exacerbation or ?CAP /aspiration or chemical pneumonitis.  - COVID 19 neg x2.  - Provided with rocephin and azithromycin, solumedrol and nebs -de-escalate abx -follow blood cultures -follow strep urine antigen -continue nebs -continue solumedrol.  -repeat x ray today after aspiration event  Dysphagia -appreciate SLP eval -modified liquids -episode of choking with eating today-- check x ray    Diabetes. Controlled. A1c 5.7. home meds include metformin -hold metformin for now -SSI for optimal control -monitor closely as solumedrol will drive glucose up   Acute kidney injury. - likely related to decreased oral intake of late due to acute illness. Received IV fluids.  -gentle IVF overnight as liquid have to be thickened so he is not drinking much       Code Status: DNR Disposition Plan: home hopefully tomorrow     Antibiotics:  doxy  HPI/Subjective: When I was trying to d/c patient had an episode of choking and difficulty breathing  Objective: Vitals:   08/25/19 0835 08/25/19 1302  BP: 105/75 (!) 127/91  Pulse: 86 76  Resp: 16 18  Temp: 97.8 F (36.6 C) 97.9 F (36.6 C)  SpO2: 96% 100%    Intake/Output Summary (Last 24 hours) at 08/25/2019 1433 Last data filed at 08/25/2019 0824 Gross per 24 hour  Intake 240 ml  Output -  Net 240 ml   Filed Weights   08/24/19 0130  Weight: 95.6 kg    Exam:   In bed, NAD  rrr  No wheezing  No LE edema  Data Reviewed: Basic Metabolic Panel: Recent Labs  Lab 08/25/2019 1400 08/24/19 0211 08/25/19 0451  NA 137 135 140  K 4.8 5.1 4.7  CL 104 104 107  CO2 23 18* 18*  GLUCOSE 144* 209* 268*  BUN 33* 31* 54*  CREATININE 1.65* 1.77* 1.96*  CALCIUM 8.9 8.5* 8.6*   Liver Function Tests: Recent Labs  Lab  08/24/19 0211  AST 70*  ALT 33  ALKPHOS 49  BILITOT 1.0  PROT 5.9*  ALBUMIN 2.8*   No results for input(s): LIPASE, AMYLASE in the last 168 hours. No results for input(s): AMMONIA in the last 168 hours. CBC: Recent Labs  Lab 08/22/2019 1400 08/24/19 0211  WBC 13.6* 12.7*  NEUTROABS 10.0*  --   HGB 10.5* 9.8*  HCT 31.8* 29.8*  MCV 97.5 96.1  PLT 152 164   Cardiac Enzymes: No results for input(s): CKTOTAL, CKMB, CKMBINDEX, TROPONINI in the last 168 hours. BNP (last 3 results) Recent Labs    09/13/2019 1400  BNP 905.4*    ProBNP (last 3 results) No results for input(s): PROBNP in the last 8760 hours.  CBG: Recent Labs  Lab 08/24/19 1234 08/24/19 1657 08/24/19 2124 08/25/19 0630 08/25/19 1109  GLUCAP 249* 243* 278* 225* 302*    Recent Results (from the past 240 hour(s))  SARS Coronavirus 2 by RT PCR (hospital order, performed in [facility] hospital lab) Nasopharyngeal Nasopharyngeal Swab     Status: None   Collection Time: 08/18/2019  2:59 PM   Specimen: Nasopharyngeal Swab  Result Value Ref Range Status   SARS Coronavirus 2 NEGATIVE NEGATIVE Final    Comment: (NOTE) If result is NEGATIVE SARS-CoV-2 target nucleic acids are NOT DETECTED. The SARS-CoV-2 RNA is generally detectable in upper and lower  respiratory specimens during the acute phase of infection. The  lowest  concentration of SARS-CoV-2 viral copies this assay can detect is 250  copies / mL. A negative result does not preclude SARS-CoV-2 infection  and should not be used as the sole basis for treatment or other  patient management decisions.  A negative result may occur with  improper specimen collection / handling, submission of specimen other  than nasopharyngeal swab, presence of viral mutation(s) within the  areas targeted by this assay, and inadequate number of viral copies  (<250 copies / mL). A negative result must be combined with clinical  observations, patient history, and epidemiological  information. If result is POSITIVE SARS-CoV-2 target nucleic acids are DETECTED. The SARS-CoV-2 RNA is generally detectable in upper and lower  respiratory specimens dur ing the acute phase of infection.  Positive  results are indicative of active infection with SARS-CoV-2.  Clinical  correlation with patient history and other diagnostic information is  necessary to determine patient infection status.  Positive results do  not rule out bacterial infection or co-infection with other viruses. If result is PRESUMPTIVE POSTIVE SARS-CoV-2 nucleic acids MAY BE PRESENT.   A presumptive positive result was obtained on the submitted specimen  and confirmed on repeat testing.  While 2019 novel coronavirus  (SARS-CoV-2) nucleic acids may be present in the submitted sample  additional confirmatory testing may be necessary for epidemiological  and / or clinical management purposes  to differentiate between  SARS-CoV-2 and other Sarbecovirus currently known to infect humans.  If clinically indicated additional testing with an alternate test  methodology [phone]) is advised. The SARS-CoV-2 RNA is generally  detectable in upper and lower respiratory sp ecimens during the acute  phase of infection. The expected result is Negative. Fact Sheet for Patients:  StrictlyIdeas.no Fact Sheet for Healthcare Providers: BankingDealers.co.za This test is not yet approved or cleared by the [place] FDA and has been authorized for detection and/or diagnosis of SARS-CoV-2 by FDA under an Emergency Use Authorization (EUA).  This EUA will remain in effect (meaning this test can be used) for the duration of the COVID-19 declaration under Section 564(b)(1) of the Act, 21 U.S.C. section 360bbb-3(b)(1), unless the authorization is terminated or revoked sooner. Performed at [facility], [place] [address], [place], [place] [address]   Blood Culture (routine x 2)      Status: None (Preliminary result)   Collection Time: 09/09/2019  3:30 PM   Specimen: BLOOD RIGHT HAND  Result Value Ref Range Status   Specimen Description BLOOD RIGHT HAND  Final   Special Requests   Final    BOTTLES DRAWN AEROBIC AND ANAEROBIC Blood Culture results may not be optimal due to an inadequate volume of blood received in culture bottles   Culture   Final    NO GROWTH < 24 HOURS Performed at [facility], [place] [address] [place], [place] [address]    Report Status PENDING  Incomplete  Blood Culture (routine x 2)     Status: None (Preliminary result)   Collection Time: 08/24/2019  3:40 PM   Specimen: BLOOD LEFT HAND  Result Value Ref Range Status   Specimen Description BLOOD LEFT HAND  Final   Special Requests   Final    BOTTLES DRAWN AEROBIC AND ANAEROBIC Blood Culture results may not be optimal due to an inadequate volume of blood received in culture bottles   Culture   Final    NO GROWTH < 24 HOURS Performed at [facility], [place]  [address], [place], [place] [address]    Report Status PENDING  Incomplete  Respiratory Panel by PCR     Status: None   Collection Time: 09/02/2019  7:58 PM   Specimen: Nasopharyngeal Swab; Respiratory  Result Value Ref Range Status   Adenovirus NOT DETECTED NOT DETECTED Final   Coronavirus 229E NOT DETECTED NOT DETECTED Final    Comment: (NOTE) The Coronavirus on the Respiratory Panel, DOES NOT test for the novel  Coronavirus (2019 nCoV)    Coronavirus HKU1 NOT DETECTED NOT DETECTED Final   Coronavirus NL63 NOT DETECTED NOT DETECTED Final   Coronavirus OC43 NOT DETECTED NOT DETECTED Final   Metapneumovirus NOT DETECTED NOT DETECTED Final   Rhinovirus / Enterovirus NOT DETECTED NOT DETECTED Final   Influenza A NOT DETECTED NOT DETECTED Final   Influenza B NOT DETECTED NOT DETECTED Final   Parainfluenza Virus 1 NOT DETECTED NOT DETECTED Final   Parainfluenza Virus 2 NOT DETECTED NOT DETECTED Final   Parainfluenza Virus 3 NOT  DETECTED NOT DETECTED Final   Parainfluenza Virus 4 NOT DETECTED NOT DETECTED Final   Respiratory Syncytial Virus NOT DETECTED NOT DETECTED Final   Bordetella pertussis NOT DETECTED NOT DETECTED Final   Chlamydophila pneumoniae NOT DETECTED NOT DETECTED Final   Mycoplasma pneumoniae NOT DETECTED NOT DETECTED Final    Comment: Performed at [facility], [place]. [address] [place], [place] [address]  SARS Coronavirus 2 by RT PCR (hospital order, performed in [facility] hospital lab) Nasopharyngeal Nasopharyngeal Swab     Status: None   Collection Time: 09/07/2019  7:58 PM   Specimen: Nasopharyngeal Swab  Result Value Ref Range Status   SARS Coronavirus 2 NEGATIVE NEGATIVE Final    Comment: (NOTE) If result is NEGATIVE SARS-CoV-2 target nucleic acids are NOT DETECTED. The SARS-CoV-2 RNA is generally detectable in upper and lower  respiratory specimens during the acute phase of infection. The lowest  concentration of SARS-CoV-2 viral copies this assay can detect is 250  copies / mL. A negative result does not preclude SARS-CoV-2 infection  and should not be used as the sole basis for treatment or other  patient management decisions.  A negative result may occur with  improper specimen collection / handling, submission of specimen other  than nasopharyngeal swab, presence of viral mutation(s) within the  areas targeted by this assay, and inadequate number of viral copies  (<250 copies / mL). A negative result must be combined with clinical  observations, patient history, and epidemiological information. If result is POSITIVE SARS-CoV-2 target nucleic acids are DETECTED. The SARS-CoV-2 RNA is generally detectable in upper and lower  respiratory specimens dur ing the acute phase of infection.  Positive  results are indicative of active infection with SARS-CoV-2.  Clinical  correlation with patient history and other diagnostic information is  necessary to determine patient infection  status.  Positive results do  not rule out bacterial infection or co-infection with other viruses. If result is PRESUMPTIVE POSTIVE SARS-CoV-2 nucleic acids MAY BE PRESENT.   A presumptive positive result was obtained on the submitted specimen  and confirmed on repeat testing.  While 2019 novel coronavirus  (SARS-CoV-2) nucleic acids may be present in the submitted sample  additional confirmatory testing may be necessary for epidemiological  and / or clinical management purposes  to differentiate between  SARS-CoV-2 and other Sarbecovirus currently known to infect humans.  If clinically indicated additional testing with an alternate test  methodology [phone]) is advised. The SARS-CoV-2 RNA is generally  detectable in upper and lower respiratory sp ecimens during the acute  phase of infection. The expected result is Negative. Fact Sheet for Patients:  StrictlyIdeas.no Fact Sheet for Healthcare Providers: BankingDealers.co.za This test is not yet approved or cleared by the [place] FDA and has been authorized for detection and/or diagnosis of SARS-CoV-2 by FDA under an Emergency Use Authorization (EUA).  This EUA will remain in effect (meaning this test can be used) for the duration of the COVID-19 declaration under Section 564(b)(1) of the Act, 21 U.S.C. section 360bbb-3(b)(1), unless the authorization is terminated or revoked sooner. Performed at [facility], [place] [address], [place], [place] [address]      Studies: Dg Swallowing Func-speech Pathology  Result Date: 08/24/2019 Objective Swallowing Evaluation: Type of Study: MBS-Modified Barium Swallow Study  Patient Details Name: Bryan Turner MRN: [id number] Date of Birth: Aug 06, 1941 Today's Date: 08/24/2019 Time: SLP Start Time (ACUTE ONLY): [id number] -SLP Stop Time (ACUTE ONLY): 1409 SLP Time Calculation (min) (ACUTE ONLY): 14 min Past Medical History: Past Medical History:  Diagnosis Date . Asthma  . Diabetes mellitus without complication ([place])  . Hard of hearing   wears hearing aides . Macular degeneration  . Plantar fasciitis  Past Surgical History: Past Surgical History: Procedure Laterality Date . CHOLECYSTECTOMY N/A 03/28/2014  Procedure: LAPAROSCOPIC CHOLECYSTECTOMY WITH INTRAOPERATIVE CHOLANGIOGRAM;  Surgeon: [name], MD;  Location: [facility] ORS;  Service: General;  Laterality: N/A; . EYE SURGERY   . HEMORRHOID SURGERY   HPI: 78 yo admitted with SOB with PNA. PMHx: asthma, DM, macular degeneration  Subjective: alert, upright in chair with spouse at bedside Assessment / Plan / Recommendation CHL IP CLINICAL IMPRESSIONS 08/24/2019 Clinical Impression Pt presents with a mild to moderate pharyngeal dysphagia of unknown etiology. Deficits c/b reduced base of tongue retraction, decreased laryngeal elevation, reduced timing and efficiency of laryngeal vestibule closure, decreased glottic closure, and diminished sensation. This allowed for: - frequent intermittent silent penetration of thin liquids before the swallow, trace silent aspiration after the swallow - gross silent penetration and aspiration of thin liquids via straw use before the swallow.  Pt with mild vallecular and pyriform sinus residuals that decreased with second swallows. Recommend nectar thick liquids and regular consistencies with medicines in puree and safe swallow precautions. Pt okay for water in between meals following oral care. Educated pt and spouse regarding recommendations and findings. SLP to follow up for further management.  SLP Visit Diagnosis Dysphagia, pharyngeal phase (R13.13) Attention and concentration deficit following -- Frontal lobe and executive function deficit following -- Impact on safety and function Moderate aspiration risk   CHL IP TREATMENT RECOMMENDATION 08/24/2019 Treatment Recommendations Therapy as outlined in treatment plan below   Prognosis 08/24/2019 Prognosis for Safe Diet  Advancement Good Barriers to Reach Goals -- Barriers/Prognosis Comment -- CHL IP DIET RECOMMENDATION 08/24/2019 SLP Diet Recommendations Nectar thick liquid;Regular solids Liquid Administration via Cup;No straw Medication Administration Whole meds with puree Compensations Slow rate;Small sips/bites;Multiple dry swallows after each bite/sip;Clear throat intermittently Postural Changes Seated upright at 90 degrees;Remain semi-upright after after feeds/meals (Comment)   CHL IP OTHER RECOMMENDATIONS 08/24/2019 Recommended Consults -- Oral Care Recommendations Oral care BID Other Recommendations Order thickener from pharmacy   CHL IP FOLLOW UP RECOMMENDATIONS 08/24/2019 Follow up Recommendations Outpatient SLP   CHL IP FREQUENCY AND DURATION 08/24/2019 Speech Therapy Frequency (ACUTE ONLY) min 2x/week Treatment Duration 1 week      CHL IP ORAL PHASE 08/24/2019 Oral Phase WFL Oral - Pudding Teaspoon --  Oral - Pudding Cup -- Oral - Honey Teaspoon -- Oral - Honey Cup -- Oral - Nectar Teaspoon -- Oral - Nectar Cup -- Oral - Nectar Straw -- Oral - Thin Teaspoon -- Oral - Thin Cup -- Oral - Thin Straw -- Oral - Puree -- Oral - Mech Soft -- Oral - Regular -- Oral - Multi-Consistency -- Oral - Pill -- Oral Phase - Comment --  CHL IP PHARYNGEAL PHASE 08/24/2019 Pharyngeal Phase Impaired Pharyngeal- Pudding Teaspoon -- Pharyngeal -- Pharyngeal- Pudding Cup -- Pharyngeal -- Pharyngeal- Honey Teaspoon -- Pharyngeal -- Pharyngeal- Honey Cup -- Pharyngeal -- Pharyngeal- Nectar Teaspoon -- Pharyngeal -- Pharyngeal- Nectar Cup Reduced tongue base retraction;Reduced laryngeal elevation;Pharyngeal residue - valleculae;Pharyngeal residue - pyriform Pharyngeal -- Pharyngeal- Nectar Straw -- Pharyngeal -- Pharyngeal- Thin Teaspoon -- Pharyngeal Material does not enter airway Pharyngeal- Thin Cup Reduced airway/laryngeal closure;Reduced epiglottic inversion;Trace aspiration;Penetration/Aspiration before swallow;Penetration/Apiration after  swallow;Reduced tongue base retraction;Reduced laryngeal elevation Pharyngeal Material enters airway, CONTACTS cords and not ejected out;Material enters airway, remains ABOVE vocal cords and not ejected out;Material enters airway, passes BELOW cords without attempt by patient to eject out (silent aspiration) Pharyngeal- Thin Straw Moderate aspiration;Reduced epiglottic inversion;Reduced airway/laryngeal closure;Reduced tongue base retraction;Reduced laryngeal elevation;Penetration/Aspiration before swallow Pharyngeal Material enters airway, passes BELOW cords without attempt by patient to eject out (silent aspiration) Pharyngeal- Puree Reduced laryngeal elevation;Reduced tongue base retraction;Pharyngeal residue - valleculae Pharyngeal -- Pharyngeal- Mechanical Soft -- Pharyngeal -- Pharyngeal- Regular Reduced tongue base retraction;Pharyngeal residue - valleculae Pharyngeal -- Pharyngeal- Multi-consistency -- Pharyngeal -- Pharyngeal- Pill Reduced tongue base retraction;Reduced laryngeal elevation Pharyngeal -- Pharyngeal Comment --  CHL IP CERVICAL ESOPHAGEAL PHASE 08/24/2019 Cervical Esophageal Phase WFL Pudding Teaspoon -- Pudding Cup -- Honey Teaspoon -- Honey Cup -- Nectar Teaspoon -- Nectar Cup -- Nectar Straw -- Thin Teaspoon -- Thin Cup -- Thin Straw -- Puree -- Mechanical Soft -- Regular -- Multi-consistency -- Pill -- Cervical Esophageal Comment -- Chelsea E Hartness MA, CCC-SLP Acute Rehabilitation Services 08/24/2019, 2:47 PM               Scheduled Meds: . doxycycline  100 mg Oral Q12H  . enoxaparin (LOVENOX) injection  40 mg Subcutaneous Q24H  . insulin aspart  0-5 Units Subcutaneous QHS  . insulin aspart  0-9 Units Subcutaneous TID WC  . ipratropium-albuterol  3 mL Nebulization BID  . methylPREDNISolone (SOLU-MEDROL) injection  40 mg Intravenous Q8H  . mometasone-formoterol  2 puff Inhalation BID   Continuous Infusions: . sodium chloride      Principal Problem:   Respiratory  distress Active Problems:   Pneumonia   Leukocytosis   Type 2 diabetes mellitus without complication ([facility])   Asthma, chronic, unspecified asthma severity, with acute exacerbation   CAP (community acquired pneumonia)   Hard of hearing   Diabetes mellitus without complication ([place])   Acute kidney injury ([place])    Time spent: 25 minutes    [facility]  If 7PM-7AM, please contact night-coverage at [email or web address], password [facility] 08/25/2019, 2:33 PM  LOS: 1 day

## 2019-08-25 NOTE — Plan of Care (Signed)
  Problem: Clinical Measurements: Goal: Respiratory complications will improve Outcome: Progressing   Problem: Activity: Goal: Risk for activity intolerance will decrease Outcome: Progressing   Problem: Safety: Goal: Ability to remain free from injury will improve Outcome: Progressing   

## 2019-08-25 NOTE — Progress Notes (Signed)
MEWS 3   Pt appears diaphoretic and anxious.  Temp 97.7, Resp 26, O2 94% 2L, HR 109   Paged Traid on call  B. Kyere   New order for ativan received   Will administer and continue to monitor patient

## 2019-08-26 ENCOUNTER — Inpatient Hospital Stay (HOSPITAL_COMMUNITY): Payer: Medicare Other

## 2019-08-26 LAB — BASIC METABOLIC PANEL
Anion gap: 14 (ref 5–15)
BUN: 65 mg/dL — ABNORMAL HIGH (ref 8–23)
CO2: 17 mmol/L — ABNORMAL LOW (ref 22–32)
Calcium: 8.5 mg/dL — ABNORMAL LOW (ref 8.9–10.3)
Chloride: 108 mmol/L (ref 98–111)
Creatinine, Ser: 2.34 mg/dL — ABNORMAL HIGH (ref 0.61–1.24)
GFR calc Af Amer: 30 mL/min — ABNORMAL LOW (ref >60)
GFR calc non Af Amer: 25 mL/min — ABNORMAL LOW (ref >60)
Glucose, Bld: 255 mg/dL — ABNORMAL HIGH (ref 70–99)
Potassium: 5.3 mmol/L — ABNORMAL HIGH (ref 3.5–5.1)
Sodium: 139 mmol/L (ref 135–145)

## 2019-08-26 LAB — URINALYSIS, ROUTINE W REFLEX MICROSCOPIC
Bacteria, UA: NONE SEEN
Bilirubin Urine: NEGATIVE
Glucose, UA: NEGATIVE mg/dL
Ketones, ur: NEGATIVE mg/dL
Leukocytes,Ua: NEGATIVE
Nitrite: NEGATIVE
Protein, ur: 30 mg/dL — AB
RBC / HPF: >50 RBC/hpf — ABNORMAL HIGH (ref 0–5)
Specific Gravity, Urine: 1.028 (ref 1.005–1.030)
pH: 5 (ref 5.0–8.0)

## 2019-08-26 LAB — CBC
HCT: 33.5 % — ABNORMAL LOW (ref 39.0–52.0)
Hemoglobin: 10.7 g/dL — ABNORMAL LOW (ref 13.0–17.0)
MCH: 31 pg (ref 26.0–34.0)
MCHC: 31.9 g/dL (ref 30.0–36.0)
MCV: 97.1 fL (ref 80.0–100.0)
Platelets: 284 10*3/uL (ref 150–400)
RBC: 3.45 MIL/uL — ABNORMAL LOW (ref 4.22–5.81)
RDW: 13.9 % (ref 11.5–15.5)
WBC: 29 10*3/uL — ABNORMAL HIGH (ref 4.0–10.5)
nRBC: 0.1 % (ref 0.0–0.2)

## 2019-08-26 LAB — GLUCOSE, CAPILLARY
Glucose-Capillary: 242 mg/dL — ABNORMAL HIGH (ref 70–99)
Glucose-Capillary: 251 mg/dL — ABNORMAL HIGH (ref 70–99)
Glucose-Capillary: 205 mg/dL — ABNORMAL HIGH (ref 70–99)
Glucose-Capillary: 161 mg/dL — ABNORMAL HIGH (ref 70–99)

## 2019-08-26 MED ORDER — SODIUM CHLORIDE 0.9 % IV SOLN
3.0000 g | Freq: Two times a day (BID) | INTRAVENOUS | Status: DC
  Administered 2019-08-26 – 2019-08-28 (×4): 3 g via INTRAVENOUS
  Filled 2019-08-26: qty 8
  Filled 2019-08-26 (×3): qty 3
  Filled 2019-08-26 (×2): qty 8

## 2019-08-26 MED ORDER — INSULIN ASPART 100 UNIT/ML ~~LOC~~ SOLN
0.0000 [IU] | Freq: Three times a day (TID) | SUBCUTANEOUS | Status: DC
  Administered 2019-08-26: 8 [IU] via SUBCUTANEOUS
  Administered 2019-08-26 – 2019-08-27 (×3): 5 [IU] via SUBCUTANEOUS
  Administered 2019-08-27: 10:00:00 3 [IU] via SUBCUTANEOUS
  Administered 2019-08-27: 13:00:00 8 [IU] via SUBCUTANEOUS
  Administered 2019-08-28: 09:00:00 2 [IU] via SUBCUTANEOUS

## 2019-08-26 MED ORDER — INSULIN ASPART 100 UNIT/ML ~~LOC~~ SOLN: 0.0000 [IU] | Freq: Every day | SUBCUTANEOUS | Status: DC

## 2019-08-26 NOTE — Progress Notes (Signed)
2312 Ativan administered, patient less anxious & no longer diaphoretic   0035 patient is sleeping, HR 96, O2 96, Resp 22

## 2019-08-26 NOTE — Plan of Care (Signed)

## 2019-08-26 NOTE — Progress Notes (Signed)
[facility] PROGRESS NOTE  OVADIA TOSTE [id number] DOB: 1941/10/04 DOA: 08/20/2019 PCP: Lawerance Cruel, MD   Bryan Turner is a 78 y.o. male with medical history significant of asthma, diabetes mellitus type 2, hard of hearing presented with progressively worsening shortness of breath that started 5 days ago.  Patient has been having mostly dry cough with worsening exertional shortness of breath.  He had a temperature of 99.9 at home today.  He has felt worsening weakness with decreased appetite. Was trying to discharge patient when he had an aspiration event and since that time has been having shortness of breath and feeling poorly.      Assessment/Plan:  Respiratory distress:  likely multifactorial I.e. asthma/aspiration /chemical pneumonitis.  - COVID 19 neg x2 as is respiratory viral panel initially improved with nebs and steroids but then had an aspiration event despite thickened liquids - nebs -refused solumedrol-- has prednisone allergy -change abx to cover aspiraiton -will get CT scan of lungs -flutter valve -monitor closely  Dysphagia -appreciate SLP eval -modified liquids -patient had episode of aspiration 101/0   Diabetes. Controlled. A1c 5.7. home meds include metformin -hold metformin for now -SSI for optimal control   Acute kidney injury. - likely related to decreased oral intake of late due to acute illness. Received IV fluids.  -continue IVF -renal U/S pending -suspect pre-renal      Family communication: wife at bedside Code Status: DNR Disposition Plan: pending improvement    HPI/Subjective: Productive cough  Objective: Vitals:   08/26/19 0820 08/26/19 0841  BP:  (!) 126/59  Pulse:  99  Resp:  18  Temp:  97.8 F (36.6 C)  SpO2: 94% 99%    Intake/Output Summary (Last 24 hours) at 08/26/2019 1218 Last data filed at 08/26/2019 0900 Gross per 24 hour  Intake 1018.86 ml  Output 100 ml  Net 918.86 ml   Filed Weights    08/24/19 0130  Weight: 95.6 kg    Exam:   In bed, ill appearing, pale  rrr  Coarse breath sounds with wheezing  Legs cool to the touch  +Bs, soft, NT  Data Reviewed: Basic Metabolic Panel: Recent Labs  Lab 09/11/2019 1400 08/24/19 0211 08/25/19 0451 08/26/19 0252  NA 137 135 140 139  K 4.8 5.1 4.7 5.3*  CL 104 104 107 108  CO2 23 18* 18* 17*  GLUCOSE 144* 209* 268* 255*  BUN 33* 31* 54* 65*  CREATININE 1.65* 1.77* 1.96* 2.34*  CALCIUM 8.9 8.5* 8.6* 8.5*   Liver Function Tests: Recent Labs  Lab 08/24/19 0211  AST 70*  ALT 33  ALKPHOS 49  BILITOT 1.0  PROT 5.9*  ALBUMIN 2.8*   No results for input(s): LIPASE, AMYLASE in the last 168 hours. No results for input(s): AMMONIA in the last 168 hours. CBC: Recent Labs  Lab 09/07/2019 1400 08/24/19 0211 08/26/19 0252  WBC 13.6* 12.7* 29.0*  NEUTROABS 10.0*  --   --   HGB 10.5* 9.8* 10.7*  HCT 31.8* 29.8* 33.5*  MCV 97.5 96.1 97.1  PLT 152 164 284   Cardiac Enzymes: No results for input(s): CKTOTAL, CKMB, CKMBINDEX, TROPONINI in the last 168 hours. BNP (last 3 results) Recent Labs    09/13/2019 1400  BNP 905.4*    ProBNP (last 3 results) No results for input(s): PROBNP in the last 8760 hours.  CBG: Recent Labs  Lab 08/25/19 1109 08/25/19 1626 08/25/19 2128 08/26/19 0646 08/26/19 1130  GLUCAP 302* 285* 268* 242* 251*  Recent Results (from the past 240 hour(s))  SARS Coronavirus 2 by RT PCR (hospital order, performed in [facility] hospital lab) Nasopharyngeal Nasopharyngeal Swab     Status: None   Collection Time: 08/27/2019  2:59 PM   Specimen: Nasopharyngeal Swab  Result Value Ref Range Status   SARS Coronavirus 2 NEGATIVE NEGATIVE Final    Comment: (NOTE) If result is NEGATIVE SARS-CoV-2 target nucleic acids are NOT DETECTED. The SARS-CoV-2 RNA is generally detectable in upper and lower  respiratory specimens during the acute phase of infection. The lowest  concentration of SARS-CoV-2  viral copies this assay can detect is 250  copies / mL. A negative result does not preclude SARS-CoV-2 infection  and should not be used as the sole basis for treatment or other  patient management decisions.  A negative result may occur with  improper specimen collection / handling, submission of specimen other  than nasopharyngeal swab, presence of viral mutation(s) within the  areas targeted by this assay, and inadequate number of viral copies  (<250 copies / mL). A negative result must be combined with clinical  observations, patient history, and epidemiological information. If result is POSITIVE SARS-CoV-2 target nucleic acids are DETECTED. The SARS-CoV-2 RNA is generally detectable in upper and lower  respiratory specimens dur ing the acute phase of infection.  Positive  results are indicative of active infection with SARS-CoV-2.  Clinical  correlation with patient history and other diagnostic information is  necessary to determine patient infection status.  Positive results do  not rule out bacterial infection or co-infection with other viruses. If result is PRESUMPTIVE POSTIVE SARS-CoV-2 nucleic acids MAY BE PRESENT.   A presumptive positive result was obtained on the submitted specimen  and confirmed on repeat testing.  While 2019 novel coronavirus  (SARS-CoV-2) nucleic acids may be present in the submitted sample  additional confirmatory testing may be necessary for epidemiological  and / or clinical management purposes  to differentiate between  SARS-CoV-2 and other Sarbecovirus currently known to infect humans.  If clinically indicated additional testing with an alternate test  methodology [phone]) is advised. The SARS-CoV-2 RNA is generally  detectable in upper and lower respiratory sp ecimens during the acute  phase of infection. The expected result is Negative. Fact Sheet for Patients:  StrictlyIdeas.no Fact Sheet for Healthcare  Providers: BankingDealers.co.za This test is not yet approved or cleared by the [place] FDA and has been authorized for detection and/or diagnosis of SARS-CoV-2 by FDA under an Emergency Use Authorization (EUA).  This EUA will remain in effect (meaning this test can be used) for the duration of the COVID-19 declaration under Section 564(b)(1) of the Act, 21 U.S.C. section 360bbb-3(b)(1), unless the authorization is terminated or revoked sooner. Performed at [facility], [place] [address], [place], [place] [address]   Blood Culture (routine x 2)     Status: None (Preliminary result)   Collection Time: 09/08/2019  3:30 PM   Specimen: BLOOD RIGHT HAND  Result Value Ref Range Status   Specimen Description BLOOD RIGHT HAND  Final   Special Requests   Final    BOTTLES DRAWN AEROBIC AND ANAEROBIC Blood Culture results may not be optimal due to an inadequate volume of blood received in culture bottles   Culture   Final    NO GROWTH 2 DAYS Performed at [facility], [place] [address], [place], [place] [address]    Report Status PENDING  Incomplete  Blood Culture (routine x 2)  Status: None (Preliminary result)   Collection Time: 08/20/2019  3:40 PM   Specimen: BLOOD LEFT HAND  Result Value Ref Range Status   Specimen Description BLOOD LEFT HAND  Final   Special Requests   Final    BOTTLES DRAWN AEROBIC AND ANAEROBIC Blood Culture results may not be optimal due to an inadequate volume of blood received in culture bottles   Culture   Final    NO GROWTH 2 DAYS Performed at [facility], [place] [address], [place], [place] [address]    Report Status PENDING  Incomplete  Respiratory Panel by PCR     Status: None   Collection Time: 09/05/2019  7:58 PM   Specimen: Nasopharyngeal Swab; Respiratory  Result Value Ref Range Status   Adenovirus NOT DETECTED NOT DETECTED Final   Coronavirus 229E NOT DETECTED NOT DETECTED Final    Comment: (NOTE) The Coronavirus  on the Respiratory Panel, DOES NOT test for the novel  Coronavirus (2019 nCoV)    Coronavirus HKU1 NOT DETECTED NOT DETECTED Final   Coronavirus NL63 NOT DETECTED NOT DETECTED Final   Coronavirus OC43 NOT DETECTED NOT DETECTED Final   Metapneumovirus NOT DETECTED NOT DETECTED Final   Rhinovirus / Enterovirus NOT DETECTED NOT DETECTED Final   Influenza A NOT DETECTED NOT DETECTED Final   Influenza B NOT DETECTED NOT DETECTED Final   Parainfluenza Virus 1 NOT DETECTED NOT DETECTED Final   Parainfluenza Virus 2 NOT DETECTED NOT DETECTED Final   Parainfluenza Virus 3 NOT DETECTED NOT DETECTED Final   Parainfluenza Virus 4 NOT DETECTED NOT DETECTED Final   Respiratory Syncytial Virus NOT DETECTED NOT DETECTED Final   Bordetella pertussis NOT DETECTED NOT DETECTED Final   Chlamydophila pneumoniae NOT DETECTED NOT DETECTED Final   Mycoplasma pneumoniae NOT DETECTED NOT DETECTED Final    Comment: Performed at [facility], [place]. [address], [place], [place] [address]  SARS Coronavirus 2 by RT PCR (hospital order, performed in [facility] hospital lab) Nasopharyngeal Nasopharyngeal Swab     Status: None   Collection Time: 09/11/2019  7:58 PM   Specimen: Nasopharyngeal Swab  Result Value Ref Range Status   SARS Coronavirus 2 NEGATIVE NEGATIVE Final    Comment: (NOTE) If result is NEGATIVE SARS-CoV-2 target nucleic acids are NOT DETECTED. The SARS-CoV-2 RNA is generally detectable in upper and lower  respiratory specimens during the acute phase of infection. The lowest  concentration of SARS-CoV-2 viral copies this assay can detect is 250  copies / mL. A negative result does not preclude SARS-CoV-2 infection  and should not be used as the sole basis for treatment or other  patient management decisions.  A negative result may occur with  improper specimen collection / handling, submission of specimen other  than nasopharyngeal swab, presence of viral mutation(s) within the  areas targeted by  this assay, and inadequate number of viral copies  (<250 copies / mL). A negative result must be combined with clinical  observations, patient history, and epidemiological information. If result is POSITIVE SARS-CoV-2 target nucleic acids are DETECTED. The SARS-CoV-2 RNA is generally detectable in upper and lower  respiratory specimens dur ing the acute phase of infection.  Positive  results are indicative of active infection with SARS-CoV-2.  Clinical  correlation with patient history and other diagnostic information is  necessary to determine patient infection status.  Positive results do  not rule out bacterial infection or co-infection with other viruses. If result is PRESUMPTIVE POSTIVE SARS-CoV-2 nucleic  acids MAY BE PRESENT.   A presumptive positive result was obtained on the submitted specimen  and confirmed on repeat testing.  While 2019 novel coronavirus  (SARS-CoV-2) nucleic acids may be present in the submitted sample  additional confirmatory testing may be necessary for epidemiological  and / or clinical management purposes  to differentiate between  SARS-CoV-2 and other Sarbecovirus currently known to infect humans.  If clinically indicated additional testing with an alternate test  methodology [phone]) is advised. The SARS-CoV-2 RNA is generally  detectable in upper and lower respiratory sp ecimens during the acute  phase of infection. The expected result is Negative. Fact Sheet for Patients:  StrictlyIdeas.no Fact Sheet for Healthcare Providers: BankingDealers.co.za This test is not yet approved or cleared by the [place] FDA and has been authorized for detection and/or diagnosis of SARS-CoV-2 by FDA under an Emergency Use Authorization (EUA).  This EUA will remain in effect (meaning this test can be used) for the duration of the COVID-19 declaration under Section 564(b)(1) of the Act, 21 U.S.C. section  360bbb-3(b)(1), unless the authorization is terminated or revoked sooner. Performed at [facility], [place] [address] [place], [place] [address]      Studies: Dg Chest 2 View  Result Date: 08/25/2019 CLINICAL DATA:  Respiratory distress: likely multifactorial I.e. asthma exacerbation or ?CAP /aspiration or chemical pneumonitis. - COVID 19 neg x2. EXAM: CHEST - 2 VIEW COMPARISON:  Chest radiograph 08/27/2019 FINDINGS: Stable cardiomediastinal contours with normal heart size. There are bilateral right greater than left interstitial opacities similar to prior, possibly representing infection or edema. Small pleural effusions. No acute finding in the visualized skeleton. IMPRESSION: Bilateral right greater than left interstitial opacities and small effusions similar to prior could represent edema or infection. Electronically Signed   By: Audie Pinto M.D.   On: 08/25/2019 15:43   Dg Swallowing Func-speech Pathology  Result Date: 08/24/2019 Objective Swallowing Evaluation: Type of Study: MBS-Modified Barium Swallow Study  Patient Details Name: CAULIN DALOMBA MRN: [id number] Date of Birth: 10-23-1941 Today's Date: 08/24/2019 Time: SLP Start Time (ACUTE ONLY): [id number] -SLP Stop Time (ACUTE ONLY): 1409 SLP Time Calculation (min) (ACUTE ONLY): 14 min Past Medical History: Past Medical History: Diagnosis Date . Asthma  . Diabetes mellitus without complication ([place])  . Hard of hearing   wears hearing aides . Macular degeneration  . Plantar fasciitis  Past Surgical History: Past Surgical History: Procedure Laterality Date . CHOLECYSTECTOMY N/A 03/28/2014  Procedure: LAPAROSCOPIC CHOLECYSTECTOMY WITH INTRAOPERATIVE CHOLANGIOGRAM;  Surgeon: [name], MD;  Location: [facility]S;  Service: General;  Laterality: N/A; . EYE SURGERY   . HEMORRHOID SURGERY   HPI: 78 yo admitted with SOB with PNA. PMHx: asthma, DM, macular degeneration  Subjective: alert, upright in chair with spouse at bedside Assessment / Plan  / Recommendation CHL IP CLINICAL IMPRESSIONS 08/24/2019 Clinical Impression Pt presents with a mild to moderate pharyngeal dysphagia of unknown etiology. Deficits c/b reduced base of tongue retraction, decreased laryngeal elevation, reduced timing and efficiency of laryngeal vestibule closure, decreased glottic closure, and diminished sensation. This allowed for: - frequent intermittent silent penetration of thin liquids before the swallow, trace silent aspiration after the swallow - gross silent penetration and aspiration of thin liquids via straw use before the swallow.  Pt with mild vallecular and pyriform sinus residuals that decreased with second swallows. Recommend nectar thick liquids and regular consistencies with medicines in puree and safe swallow precautions. Pt okay for water in between meals following oral  care. Educated pt and spouse regarding recommendations and findings. SLP to follow up for further management.  SLP Visit Diagnosis Dysphagia, pharyngeal phase (R13.13) Attention and concentration deficit following -- Frontal lobe and executive function deficit following -- Impact on safety and function Moderate aspiration risk   CHL IP TREATMENT RECOMMENDATION 08/24/2019 Treatment Recommendations Therapy as outlined in treatment plan below   Prognosis 08/24/2019 Prognosis for Safe Diet Advancement Good Barriers to Reach Goals -- Barriers/Prognosis Comment -- CHL IP DIET RECOMMENDATION 08/24/2019 SLP Diet Recommendations Nectar thick liquid;Regular solids Liquid Administration via Cup;No straw Medication Administration Whole meds with puree Compensations Slow rate;Small sips/bites;Multiple dry swallows after each bite/sip;Clear throat intermittently Postural Changes Seated upright at 90 degrees;Remain semi-upright after after feeds/meals (Comment)   CHL IP OTHER RECOMMENDATIONS 08/24/2019 Recommended Consults -- Oral Care Recommendations Oral care BID Other Recommendations Order thickener from pharmacy    CHL IP FOLLOW UP RECOMMENDATIONS 08/24/2019 Follow up Recommendations Outpatient SLP   CHL IP FREQUENCY AND DURATION 08/24/2019 Speech Therapy Frequency (ACUTE ONLY) min 2x/week Treatment Duration 1 week      CHL IP ORAL PHASE 08/24/2019 Oral Phase WFL Oral - Pudding Teaspoon -- Oral - Pudding Cup -- Oral - Honey Teaspoon -- Oral - Honey Cup -- Oral - Nectar Teaspoon -- Oral - Nectar Cup -- Oral - Nectar Straw -- Oral - Thin Teaspoon -- Oral - Thin Cup -- Oral - Thin Straw -- Oral - Puree -- Oral - Mech Soft -- Oral - Regular -- Oral - Multi-Consistency -- Oral - Pill -- Oral Phase - Comment --  CHL IP PHARYNGEAL PHASE 08/24/2019 Pharyngeal Phase Impaired Pharyngeal- Pudding Teaspoon -- Pharyngeal -- Pharyngeal- Pudding Cup -- Pharyngeal -- Pharyngeal- Honey Teaspoon -- Pharyngeal -- Pharyngeal- Honey Cup -- Pharyngeal -- Pharyngeal- Nectar Teaspoon -- Pharyngeal -- Pharyngeal- Nectar Cup Reduced tongue base retraction;Reduced laryngeal elevation;Pharyngeal residue - valleculae;Pharyngeal residue - pyriform Pharyngeal -- Pharyngeal- Nectar Straw -- Pharyngeal -- Pharyngeal- Thin Teaspoon -- Pharyngeal Material does not enter airway Pharyngeal- Thin Cup Reduced airway/laryngeal closure;Reduced epiglottic inversion;Trace aspiration;Penetration/Aspiration before swallow;Penetration/Apiration after swallow;Reduced tongue base retraction;Reduced laryngeal elevation Pharyngeal Material enters airway, CONTACTS cords and not ejected out;Material enters airway, remains ABOVE vocal cords and not ejected out;Material enters airway, passes BELOW cords without attempt by patient to eject out (silent aspiration) Pharyngeal- Thin Straw Moderate aspiration;Reduced epiglottic inversion;Reduced airway/laryngeal closure;Reduced tongue base retraction;Reduced laryngeal elevation;Penetration/Aspiration before swallow Pharyngeal Material enters airway, passes BELOW cords without attempt by patient to eject out (silent aspiration) Pharyngeal-  Puree Reduced laryngeal elevation;Reduced tongue base retraction;Pharyngeal residue - valleculae Pharyngeal -- Pharyngeal- Mechanical Soft -- Pharyngeal -- Pharyngeal- Regular Reduced tongue base retraction;Pharyngeal residue - valleculae Pharyngeal -- Pharyngeal- Multi-consistency -- Pharyngeal -- Pharyngeal- Pill Reduced tongue base retraction;Reduced laryngeal elevation Pharyngeal -- Pharyngeal Comment --  CHL IP CERVICAL ESOPHAGEAL PHASE 08/24/2019 Cervical Esophageal Phase WFL Pudding Teaspoon -- Pudding Cup -- Honey Teaspoon -- Honey Cup -- Nectar Teaspoon -- Nectar Cup -- Nectar Straw -- Thin Teaspoon -- Thin Cup -- Thin Straw -- Puree -- Mechanical Soft -- Regular -- Multi-consistency -- Pill -- Cervical Esophageal Comment -- Chelsea E Hartness MA, CCC-SLP Acute Rehabilitation Services 08/24/2019, 2:47 PM               Scheduled Meds: . enoxaparin (LOVENOX) injection  40 mg Subcutaneous Q24H  . insulin aspart  0-15 Units Subcutaneous TID WC  . insulin aspart  0-5 Units Subcutaneous QHS  . ipratropium-albuterol  3 mL Nebulization BID  . mometasone-formoterol  2 puff  Inhalation BID  . pantoprazole  40 mg Oral Daily   Continuous Infusions: . sodium chloride 100 mL/hr at 08/26/19 0824  . ampicillin-sulbactam (UNASYN) IV      Principal Problem:   Respiratory distress Active Problems:   Pneumonia   Leukocytosis   Type 2 diabetes mellitus without complication (HCC)   Asthma, chronic, unspecified asthma severity, with acute exacerbation   CAP (community acquired pneumonia)   Hard of hearing   Diabetes mellitus without complication ([place])   Acute kidney injury ([place])    Time spent: 35 minutes    [facility]  If 7PM-7AM, please contact night-coverage at [email or web address], password [facility] 08/26/2019, 12:18 PM  LOS: 2 days

## 2019-08-26 NOTE — Progress Notes (Signed)
RN was called into room by wife. Wife is concerned because husband is seeing little people at the end of his bed. RN asked patient some questions about the hallucinations. Patient stated hallucinations have been there since yesterday, they come and go, he cannot hear them, they stay at end of bed, and they just play games. RN noticed that this seems to happen when wife is ready to go home, since patient does not complain about hallucinations any other time of the day. RN called Dr. Eliseo Squires. Dr. Eliseo Squires ordered to give a dose of ativan to help patient relax.   RN was called into room again due to patient's urine being a blood tinged color. UA was sent out. Dr. Eliseo Squires aware.

## 2019-08-26 NOTE — Progress Notes (Signed)
Pharmacy Antibiotic Note  Bryan Turner is a 78 y.o. male admitted on 09/04/2019 with aspiration pneumonia.  Pharmacy has been consulted for Unasyn dosing.  WBC trending up to 29 (but also on steroid), afebrile. Patient's CrCl is borderline for antibiotic dose adjustment. Since patient has AKI with worsening renal function over admission and SCr trended up from previous day, will conservatively dose Unasyn and monitor for improvement in renal function to increase dose.  Plan: Unasyn 3g IV q12h Monitor clinical picture, renal function F/U C&S, abx de-escalation, LOT   Height: 6' (182.9 cm) Weight: 210 lb 12.2 oz (95.6 kg) IBW/kg (Calculated) : 77.6  Temp (24hrs), Avg:97.7 F (36.5 C), Min:97.6 F (36.4 C), Max:97.9 F (36.6 C)  Recent Labs  Lab 09/02/2019 1400 09/13/2019 1459 08/29/2019 2030 08/24/19 0211 08/25/19 0451 08/26/19 0252  WBC 13.6*  --   --  12.7*  --  29.0*  CREATININE 1.65*  --   --  1.77* 1.96* 2.34*  LATICACIDVEN  --  1.4 1.4  --   --   --     Estimated Creatinine Clearance: 30.7 mL/min (A) (by C-G formula based on SCr of 2.34 mg/dL (H)).    Allergies  Allergen Reactions  . Aspirin     Bleeds easily  . Codeine Swelling    Throat swelling  . Prednisone   . Tamiflu [Oseltamivir] Other (See Comments)    Hallucinations    Antimicrobials this admission: 10/11>Augmentin x 2 doses 10/11 Unasyn 10/10 Doxycycline x 1 10/8 Ceftriaxone>10/9 10/8 Azithromycin>10/9   Microbiology results: 10/8 RVP neg 10/8 covid neg 10/5 BCx ngtd  Thank you for allowing pharmacy to be a part of this patient's care.   Brendolyn Patty, PharmD PGY2 Pharmacy Resident Phone [phone]  08/26/2019   12:19 PM

## 2019-08-26 NOTE — Plan of Care (Signed)
  Problem: Clinical Measurements: Goal: Ability to maintain clinical measurements within normal limits will improve Outcome: Progressing Goal: Will remain free from infection Outcome: Progressing Goal: Diagnostic test results will improve Outcome: Progressing Goal: Respiratory complications will improve Outcome: Progressing   Problem: Activity: Goal: Risk for activity intolerance will decrease Outcome: Progressing   Problem: Coping: Goal: Level of anxiety will decrease Outcome: Progressing   Problem: Safety: Goal: Ability to remain free from injury will improve Outcome: Progressing

## 2019-08-26 NOTE — Progress Notes (Signed)
Dr. Eliseo Squires notified that UA results are in.

## 2019-08-27 ENCOUNTER — Inpatient Hospital Stay (HOSPITAL_COMMUNITY): Payer: Medicare Other

## 2019-08-27 DIAGNOSIS — R0603 Acute respiratory distress: Secondary | ICD-10-CM

## 2019-08-27 DIAGNOSIS — T17908D Unspecified foreign body in respiratory tract, part unspecified causing other injury, subsequent encounter: Secondary | ICD-10-CM

## 2019-08-27 DIAGNOSIS — E119 Type 2 diabetes mellitus without complications: Secondary | ICD-10-CM

## 2019-08-27 DIAGNOSIS — I361 Nonrheumatic tricuspid (valve) insufficiency: Secondary | ICD-10-CM

## 2019-08-27 DIAGNOSIS — N179 Acute kidney failure, unspecified: Secondary | ICD-10-CM

## 2019-08-27 DIAGNOSIS — I340 Nonrheumatic mitral (valve) insufficiency: Secondary | ICD-10-CM

## 2019-08-27 DIAGNOSIS — I5020 Unspecified systolic (congestive) heart failure: Secondary | ICD-10-CM

## 2019-08-27 DIAGNOSIS — I214 Non-ST elevation (NSTEMI) myocardial infarction: Secondary | ICD-10-CM

## 2019-08-27 LAB — CBC
HCT: 34.7 % — ABNORMAL LOW (ref 39.0–52.0)
Hemoglobin: 11.1 g/dL — ABNORMAL LOW (ref 13.0–17.0)
MCH: 31 pg (ref 26.0–34.0)
MCHC: 32 g/dL (ref 30.0–36.0)
MCV: 96.9 fL (ref 80.0–100.0)
Platelets: 243 10*3/uL (ref 150–400)
RBC: 3.58 MIL/uL — ABNORMAL LOW (ref 4.22–5.81)
RDW: 14 % (ref 11.5–15.5)
WBC: 25.7 10*3/uL — ABNORMAL HIGH (ref 4.0–10.5)
nRBC: 0.6 % — ABNORMAL HIGH (ref 0.0–0.2)

## 2019-08-27 LAB — TSH: TSH: 1.042 u[IU]/mL (ref 0.350–4.500)

## 2019-08-27 LAB — GLUCOSE, CAPILLARY
Glucose-Capillary: 185 mg/dL — ABNORMAL HIGH (ref 70–99)
Glucose-Capillary: 279 mg/dL — ABNORMAL HIGH (ref 70–99)
Glucose-Capillary: 202 mg/dL — ABNORMAL HIGH (ref 70–99)
Glucose-Capillary: 169 mg/dL — ABNORMAL HIGH (ref 70–99)

## 2019-08-27 LAB — MRSA PCR SCREENING: MRSA by PCR: NEGATIVE

## 2019-08-27 LAB — PROCALCITONIN: Procalcitonin: 0.22 ng/mL

## 2019-08-27 LAB — BASIC METABOLIC PANEL
Anion gap: 14 (ref 5–15)
Anion gap: 13 (ref 5–15)
BUN: 67 mg/dL — ABNORMAL HIGH (ref 8–23)
BUN: 66 mg/dL — ABNORMAL HIGH (ref 8–23)
CO2: 16 mmol/L — ABNORMAL LOW (ref 22–32)
CO2: 17 mmol/L — ABNORMAL LOW (ref 22–32)
Calcium: 8.2 mg/dL — ABNORMAL LOW (ref 8.9–10.3)
Calcium: 8.1 mg/dL — ABNORMAL LOW (ref 8.9–10.3)
Chloride: 111 mmol/L (ref 98–111)
Chloride: 113 mmol/L — ABNORMAL HIGH (ref 98–111)
Creatinine, Ser: 2.23 mg/dL — ABNORMAL HIGH (ref 0.61–1.24)
Creatinine, Ser: 2.04 mg/dL — ABNORMAL HIGH (ref 0.61–1.24)
GFR calc Af Amer: 31 mL/min — ABNORMAL LOW (ref >60)
GFR calc Af Amer: 35 mL/min — ABNORMAL LOW (ref >60)
GFR calc non Af Amer: 27 mL/min — ABNORMAL LOW (ref >60)
GFR calc non Af Amer: 30 mL/min — ABNORMAL LOW (ref >60)
Glucose, Bld: 210 mg/dL — ABNORMAL HIGH (ref 70–99)
Glucose, Bld: 229 mg/dL — ABNORMAL HIGH (ref 70–99)
Potassium: 5.3 mmol/L — ABNORMAL HIGH (ref 3.5–5.1)
Potassium: 5.2 mmol/L — ABNORMAL HIGH (ref 3.5–5.1)
Sodium: 141 mmol/L (ref 135–145)
Sodium: 143 mmol/L (ref 135–145)

## 2019-08-27 LAB — LACTIC ACID, PLASMA: Lactic Acid, Venous: 4 mmol/L (ref 0.5–1.9)

## 2019-08-27 LAB — ECHOCARDIOGRAM COMPLETE
Height: 72 in
Weight: 3372.16 oz

## 2019-08-27 LAB — CALCIUM / CREATININE RATIO, URINE
Calcium, Ur: 2.3 mg/dL
Calcium/Creat.Ratio: 10 mg/g creat — ABNORMAL LOW (ref 14–318)
Creatinine, Urine: 236.1 mg/dL

## 2019-08-27 LAB — LEGIONELLA PNEUMOPHILA SEROGP 1 UR AG: L. pneumophila Serogp 1 Ur Ag: NEGATIVE

## 2019-08-27 LAB — CREATININE, URINE, RANDOM: Creatinine, Urine: 21.76 mg/dL

## 2019-08-27 LAB — SODIUM, URINE, RANDOM: Sodium, Ur: 120 mmol/L

## 2019-08-27 LAB — BRAIN NATRIURETIC PEPTIDE: B Natriuretic Peptide: 3028.8 pg/mL — ABNORMAL HIGH (ref 0.0–100.0)

## 2019-08-27 LAB — TROPONIN I (HIGH SENSITIVITY)
Troponin I (High Sensitivity): 8045 ng/L (ref <18)
Troponin I (High Sensitivity): 9101 ng/L (ref <18)

## 2019-08-27 MED ORDER — FUROSEMIDE 10 MG/ML IJ SOLN
40.0000 mg | Freq: Two times a day (BID) | INTRAMUSCULAR | Status: DC
  Administered 2019-08-27 – 2019-08-28 (×2): 40 mg via INTRAVENOUS
  Filled 2019-08-27 (×2): qty 4

## 2019-08-27 MED ORDER — FUROSEMIDE 10 MG/ML IJ SOLN
40.0000 mg | Freq: Once | INTRAMUSCULAR | Status: AC
  Administered 2019-08-27: 13:00:00 40 mg via INTRAVENOUS
  Filled 2019-08-27: qty 4

## 2019-08-27 MED ORDER — PERFLUTREN LIPID MICROSPHERE
1.0000 mL | INTRAVENOUS | Status: AC | PRN
  Administered 2019-08-27: 15:00:00 3 mL via INTRAVENOUS
  Filled 2019-08-27: qty 10

## 2019-08-27 MED ORDER — INSULIN ASPART 100 UNIT/ML ~~LOC~~ SOLN
2.0000 [IU] | Freq: Three times a day (TID) | SUBCUTANEOUS | Status: DC
  Administered 2019-08-27 – 2019-08-28 (×2): 2 [IU] via SUBCUTANEOUS

## 2019-08-27 MED ORDER — SODIUM BICARBONATE 650 MG PO TABS
650.0000 mg | ORAL_TABLET | Freq: Two times a day (BID) | ORAL | Status: DC
  Administered 2019-08-27 – 2019-08-28 (×3): 650 mg via ORAL
  Filled 2019-08-27 (×3): qty 1

## 2019-08-27 MED ORDER — ASPIRIN 325 MG PO TABS
325.0000 mg | ORAL_TABLET | Freq: Once | ORAL | Status: AC
  Administered 2019-08-27: 325 mg via ORAL
  Filled 2019-08-27: qty 1

## 2019-08-27 MED ORDER — HEPARIN (PORCINE) 25000 UT/250ML-% IV SOLN
1300.0000 [IU]/h | INTRAVENOUS | Status: DC
  Administered 2019-08-27: 1300 [IU]/h via INTRAVENOUS
  Filled 2019-08-27: qty 250

## 2019-08-27 NOTE — Progress Notes (Signed)
  Echocardiogram 2D Echocardiogram with Definity has been performed.  Bryan Turner 08/27/2019, 3:26 PM

## 2019-08-27 NOTE — Plan of Care (Signed)

## 2019-08-27 NOTE — Progress Notes (Signed)
PROGRESS NOTE    Bryan Turner  [id number] DOB: 1941-09-07 DOA: 09/02/2019 PCP: Lawerance Cruel, MD   Brief Narrative:   78 year old gentleman prior history of type 2 diabetes mellitus, asthma presents to ED on 10/8/20204 progressive shortness of breath associated with dry cough and fever.  His COVID 19 screening test was negative on admission.  Revision of acute respiratory failure with hypoxia probably secondary to aspiration pneumonia. Over the course of hospitalization his creatinine continues to increase from 1.6-2.2 today.  On exam today patient appears fluid overloaded CT chest without contrast shows right greater than left bilateral pleural effusions. IV fluids were stopped Assessment & Plan:   Principal Problem:   Respiratory distress Active Problems:   Pneumonia   Leukocytosis   Type 2 diabetes mellitus without complication (HCC)   Asthma, chronic, unspecified asthma severity, with acute exacerbation   CAP (community acquired pneumonia)   Hard of hearing   Diabetes mellitus without complication ([place])   Acute kidney injury ([place])   Acute respiratory failure with hypoxia secondary to aspiration pneumonia and acute CHF?  Transfer the patient to progressive unit.  Continue with Unasyn and start the patient on IV lasix 40 mg BID.  Get echocardiogram and troponins  continue with strict intake and output, daily weights.  Continue with [place] oxygen to keep sats greater than 90%. Currently on 2lit of [place] oxygen.  Procalcitonin is 0.2 Elevated lactic acid and elevated BNP >3000.  Will request cardiology consult for acute CHF.     AKI with metabolic acidosis and mild hyperkalemia:  ? Fluid overload vs ATN from aspiration pneumonia.  pts baseline creatinine at 1.34 in 2018.  Creatinine has worsened from 1. 6 to 2.4.  Get US renal, negative for hydronephrosis.  Urine analysis negative for infection  get  urine sodium and urine creatinine. Diuresis with IV Lasix 40 mg twice  daily and follow-up renal parameters tonight and tomorrow. If creatinine continues to increase with diuresis we will request nephrology consult in the morning. Sodium bicarbonate added for metabolic acidosis.     Hyperkalemia 2 doses of IV Lasix ordered. A 12-lead EKG ordered. Repeat potassium tonight.  Type 2 diabetes mellitus with hyperglycemia CBG (last 3)  Recent Labs    08/26/19 2112 08/27/19 0639 08/27/19 1152  GLUCAP 161* 185* 279*   CABG earlier this afternoon slightly elevated and greater than 250.  Will add 2 units of NovoLog 3 times daily AC.    Mild normocytic anemia Anemia of chronic disease Get anemia panel   Leukocytosis Probably secondary to aspiration pneumonia.   Asthma No wheezing heard continue with [facility] as needed.   DVT prophylaxis: lovenox Code Status:  DNR Family Communication: DISCUSSED with wife at bedside.  Disposition Plan: pending clinical improvement. Transfer pt to step down.    Consultants:   None.   Procedures: CT CHET WITHOUT CONTRAST.  US RENAL.  ECHOCARDIOGRAM.   Antimicrobials: unasyn since admission.   Subjective:  Pt drowsy able to answer simple questions, repeat s he is feeling weak and sob.  No chest pain or nausea, vomiting or abd pain or flank pain.  Objective: Vitals:   08/26/19 2105 08/26/19 2116 08/27/19 0358 08/27/19 0752  BP:   121/85 118/77  Pulse: 96  98 (!) 103  Resp: 18  18 17   Temp:  97.6 F (36.4 C) 98.3 F (36.8 C) 97.6 F (36.4 C)  TempSrc:  Oral Oral Oral  SpO2: 95%  98% 100%  Weight:  Height:        Intake/Output Summary (Last 24 hours) at 08/27/2019 1052 Last data filed at 08/27/2019 1032 Gross per 24 hour  Intake 1858.77 ml  Output 1100 ml  Net 758.77 ml   Filed Weights   08/24/19 0130  Weight: 95.6 kg    Examination:  General exam: Appears calm and comfortable  Respiratory system: diminished at bases, no wheezing or rhonchi.  Cardiovascular system: S1 & S2 heard,  RRR. Trace leg edema.  Gastrointestinal system: Abdomen is nondistended, soft and nontender. No organomegaly or masses felt. Normal bowel sounds heard. Central nervous system: Alert and oriented. No focal neurological deficits. Extremities: Symmetric 5 x 5 power. Skin: No rashes, lesions or ulcers Psychiatry:  Mood & affect appropriate.     Data Reviewed: I have personally reviewed following labs and imaging studies  CBC: Recent Labs  Lab 09/05/2019 1400 08/24/19 0211 08/26/19 0252 08/27/19 0539  WBC 13.6* 12.7* 29.0* 25.7*  NEUTROABS 10.0*  --   --   --   HGB 10.5* 9.8* 10.7* 11.1*  HCT 31.8* 29.8* 33.5* 34.7*  MCV 97.5 96.1 97.1 96.9  PLT 152 164 284 0000000   Basic Metabolic Panel: Recent Labs  Lab 09/12/2019 1400 08/24/19 0211 08/25/19 0451 08/26/19 0252 08/27/19 0539  NA 137 135 140 139 141  K 4.8 5.1 4.7 5.3* 5.3*  CL 104 104 107 108 111  CO2 23 18* 18* 17* 16*  GLUCOSE 144* 209* 268* 255* 210*  BUN 33* 31* 54* 65* 67*  CREATININE 1.65* 1.77* 1.96* 2.34* 2.23*  CALCIUM 8.9 8.5* 8.6* 8.5* 8.2*   GFR: Estimated Creatinine Clearance: 32.2 mL/min (A) (by C-G formula based on SCr of 2.23 mg/dL (H)). Liver Function Tests: Recent Labs  Lab 08/24/19 0211  AST 70*  ALT 33  ALKPHOS 49  BILITOT 1.0  PROT 5.9*  ALBUMIN 2.8*   No results for input(s): LIPASE, AMYLASE in the last 168 hours. No results for input(s): AMMONIA in the last 168 hours. Coagulation Profile: No results for input(s): INR, PROTIME in the last 168 hours. Cardiac Enzymes: No results for input(s): CKTOTAL, CKMB, CKMBINDEX, TROPONINI in the last 168 hours. BNP (last 3 results) No results for input(s): PROBNP in the last 8760 hours. HbA1C: No results for input(s): HGBA1C in the last 72 hours. CBG: Recent Labs  Lab 08/26/19 0646 08/26/19 1130 08/26/19 1604 08/26/19 2112 08/27/19 0639  GLUCAP 242* 251* 205* 161* 185*   Lipid Profile: No results for input(s): CHOL, HDL, LDLCALC, TRIG,  CHOLHDL, LDLDIRECT in the last 72 hours. Thyroid Function Tests: Recent Labs    08/27/19 0539  TSH 1.042   Anemia Panel: No results for input(s): VITAMINB12, FOLATE, FERRITIN, TIBC, IRON, RETICCTPCT in the last 72 hours. Sepsis Labs: Recent Labs  Lab 09/08/2019 1459 08/18/2019 2030  PROCALCITON <0.10  --   LATICACIDVEN 1.4 1.4    Recent Results (from the past 240 hour(s))  SARS Coronavirus 2 by RT PCR (hospital order, performed in [facility] hospital lab) Nasopharyngeal Nasopharyngeal Swab     Status: None   Collection Time: 08/19/2019  2:59 PM   Specimen: Nasopharyngeal Swab  Result Value Ref Range Status   SARS Coronavirus 2 NEGATIVE NEGATIVE Final    Comment: (NOTE) If result is NEGATIVE SARS-CoV-2 target nucleic acids are NOT DETECTED. The SARS-CoV-2 RNA is generally detectable in upper and lower  respiratory specimens during the acute phase of infection. The lowest  concentration of SARS-CoV-2 viral copies this assay can detect is  250  copies / mL. A negative result does not preclude SARS-CoV-2 infection  and should not be used as the sole basis for treatment or other  patient management decisions.  A negative result may occur with  improper specimen collection / handling, submission of specimen other  than nasopharyngeal swab, presence of viral mutation(s) within the  areas targeted by this assay, and inadequate number of viral copies  (<250 copies / mL). A negative result must be combined with clinical  observations, patient history, and epidemiological information. If result is POSITIVE SARS-CoV-2 target nucleic acids are DETECTED. The SARS-CoV-2 RNA is generally detectable in upper and lower  respiratory specimens dur ing the acute phase of infection.  Positive  results are indicative of active infection with SARS-CoV-2.  Clinical  correlation with patient history and other diagnostic information is  necessary to determine patient infection status.  Positive results  do  not rule out bacterial infection or co-infection with other viruses. If result is PRESUMPTIVE POSTIVE SARS-CoV-2 nucleic acids MAY BE PRESENT.   A presumptive positive result was obtained on the submitted specimen  and confirmed on repeat testing.  While 2019 novel coronavirus  (SARS-CoV-2) nucleic acids may be present in the submitted sample  additional confirmatory testing may be necessary for epidemiological  and / or clinical management purposes  to differentiate between  SARS-CoV-2 and other Sarbecovirus currently known to infect humans.  If clinically indicated additional testing with an alternate test  methodology [phone]) is advised. The SARS-CoV-2 RNA is generally  detectable in upper and lower respiratory sp ecimens during the acute  phase of infection. The expected result is Negative. Fact Sheet for Patients:  StrictlyIdeas.no Fact Sheet for Healthcare Providers: BankingDealers.co.za This test is not yet approved or cleared by the [place] FDA and has been authorized for detection and/or diagnosis of SARS-CoV-2 by FDA under an Emergency Use Authorization (EUA).  This EUA will remain in effect (meaning this test can be used) for the duration of the COVID-19 declaration under Section 564(b)(1) of the Act, 21 U.S.C. section 360bbb-3(b)(1), unless the authorization is terminated or revoked sooner. Performed at [facility], [place] [address], [place], [place] [address]   Blood Culture (routine x 2)     Status: None (Preliminary result)   Collection Time: 09/12/2019  3:30 PM   Specimen: BLOOD RIGHT HAND  Result Value Ref Range Status   Specimen Description BLOOD RIGHT HAND  Final   Special Requests   Final    BOTTLES DRAWN AEROBIC AND ANAEROBIC Blood Culture results may not be optimal due to an inadequate volume of blood received in culture bottles   Culture   Final    NO GROWTH 4 DAYS Performed at [facility], [place] [address] [place], [place] [address]    Report Status PENDING  Incomplete  Blood Culture (routine x 2)     Status: None (Preliminary result)   Collection Time: 09/12/2019  3:40 PM   Specimen: BLOOD LEFT HAND  Result Value Ref Range Status   Specimen Description BLOOD LEFT HAND  Final   Special Requests   Final    BOTTLES DRAWN AEROBIC AND ANAEROBIC Blood Culture results may not be optimal due to an inadequate volume of blood received in culture bottles   Culture   Final    NO GROWTH 4 DAYS Performed at [facility], [place] [address] [place], [place] [address]    Report Status PENDING  Incomplete  Respiratory  Panel by PCR     Status: None   Collection Time: 08/17/2019  7:58 PM   Specimen: Nasopharyngeal Swab; Respiratory  Result Value Ref Range Status   Adenovirus NOT DETECTED NOT DETECTED Final   Coronavirus 229E NOT DETECTED NOT DETECTED Final    Comment: (NOTE) The Coronavirus on the Respiratory Panel, DOES NOT test for the novel  Coronavirus (2019 nCoV)    Coronavirus HKU1 NOT DETECTED NOT DETECTED Final   Coronavirus NL63 NOT DETECTED NOT DETECTED Final   Coronavirus OC43 NOT DETECTED NOT DETECTED Final   Metapneumovirus NOT DETECTED NOT DETECTED Final   Rhinovirus / Enterovirus NOT DETECTED NOT DETECTED Final   Influenza A NOT DETECTED NOT DETECTED Final   Influenza B NOT DETECTED NOT DETECTED Final   Parainfluenza Virus 1 NOT DETECTED NOT DETECTED Final   Parainfluenza Virus 2 NOT DETECTED NOT DETECTED Final   Parainfluenza Virus 3 NOT DETECTED NOT DETECTED Final   Parainfluenza Virus 4 NOT DETECTED NOT DETECTED Final   Respiratory Syncytial Virus NOT DETECTED NOT DETECTED Final   Bordetella pertussis NOT DETECTED NOT DETECTED Final   Chlamydophila pneumoniae NOT DETECTED NOT DETECTED Final   Mycoplasma pneumoniae NOT DETECTED NOT DETECTED Final    Comment: Performed at [facility], [place]. [address], [place], [place] [address]  SARS Coronavirus 2 by  RT PCR (hospital order, performed in [facility] hospital lab) Nasopharyngeal Nasopharyngeal Swab     Status: None   Collection Time: 08/22/2019  7:58 PM   Specimen: Nasopharyngeal Swab  Result Value Ref Range Status   SARS Coronavirus 2 NEGATIVE NEGATIVE Final    Comment: (NOTE) If result is NEGATIVE SARS-CoV-2 target nucleic acids are NOT DETECTED. The SARS-CoV-2 RNA is generally detectable in upper and lower  respiratory specimens during the acute phase of infection. The lowest  concentration of SARS-CoV-2 viral copies this assay can detect is 250  copies / mL. A negative result does not preclude SARS-CoV-2 infection  and should not be used as the sole basis for treatment or other  patient management decisions.  A negative result may occur with  improper specimen collection / handling, submission of specimen other  than nasopharyngeal swab, presence of viral mutation(s) within the  areas targeted by this assay, and inadequate number of viral copies  (<250 copies / mL). A negative result must be combined with clinical  observations, patient history, and epidemiological information. If result is POSITIVE SARS-CoV-2 target nucleic acids are DETECTED. The SARS-CoV-2 RNA is generally detectable in upper and lower  respiratory specimens dur ing the acute phase of infection.  Positive  results are indicative of active infection with SARS-CoV-2.  Clinical  correlation with patient history and other diagnostic information is  necessary to determine patient infection status.  Positive results do  not rule out bacterial infection or co-infection with other viruses. If result is PRESUMPTIVE POSTIVE SARS-CoV-2 nucleic acids MAY BE PRESENT.   A presumptive positive result was obtained on the submitted specimen  and confirmed on repeat testing.  While 2019 novel coronavirus  (SARS-CoV-2) nucleic acids may be present in the submitted sample  additional confirmatory testing may be necessary for  epidemiological  and / or clinical management purposes  to differentiate between  SARS-CoV-2 and other Sarbecovirus currently known to infect humans.  If clinically indicated additional testing with an alternate test  methodology [phone]) is advised. The SARS-CoV-2 RNA is generally  detectable in upper and lower respiratory sp ecimens during the acute  phase  of infection. The expected result is Negative. Fact Sheet for Patients:  StrictlyIdeas.no Fact Sheet for Healthcare Providers: BankingDealers.co.za This test is not yet approved or cleared by the [place] FDA and has been authorized for detection and/or diagnosis of SARS-CoV-2 by FDA under an Emergency Use Authorization (EUA).  This EUA will remain in effect (meaning this test can be used) for the duration of the COVID-19 declaration under Section 564(b)(1) of the Act, 21 U.S.C. section 360bbb-3(b)(1), unless the authorization is terminated or revoked sooner. Performed at [facility], [place] [address], [place], [place] [address]          Radiology Studies: Dg Chest 2 View  Result Date: 08/25/2019 CLINICAL DATA:  Respiratory distress: likely multifactorial I.e. asthma exacerbation or ?CAP /aspiration or chemical pneumonitis. - COVID 19 neg x2. EXAM: CHEST - 2 VIEW COMPARISON:  Chest radiograph 09/04/2019 FINDINGS: Stable cardiomediastinal contours with normal heart size. There are bilateral right greater than left interstitial opacities similar to prior, possibly representing infection or edema. Small pleural effusions. No acute finding in the visualized skeleton. IMPRESSION: Bilateral right greater than left interstitial opacities and small effusions similar to prior could represent edema or infection. Electronically Signed   By: Audie Pinto M.D.   On: 08/25/2019 15:43   Ct Chest Wo Contrast  Result Date: 08/26/2019 CLINICAL DATA:  Worsening shortness of breath  starting 5 days ago. Dry cough. Evaluate for aspiration. History of asthma diabetes. EXAM: CT CHEST WITHOUT CONTRAST TECHNIQUE: Multidetector CT imaging of the chest was performed following the standard protocol without IV contrast. COMPARISON:  08/25/2019 chest radiograph.  No prior CT. FINDINGS: Mild motion degradation. Cardiovascular: Aortic and branch vessel atherosclerosis. Tortuous thoracic aorta. Borderline cardiomegaly. Multivessel coronary artery atherosclerosis. Mediastinum/Nodes: No middle mediastinal adenopathy. Hilar regions poorly evaluated without intravenous contrast. Normal caliber of the esophagus. Lungs/Pleura: Moderate right and small to moderate left pleural effusion. Presumed secretions throughout the trachea. Patent airways, with compression of lower lobe regions. Bibasilar dependent consolidation. Minimal biapical septal thickening. Bilateral upper lobe peribronchovascular ground-glass and nodular airspace disease. Upper Abdomen: Cholecystectomy. Normal imaged portions of the spleen, stomach, adrenal glands. Mild renal cortical thinning bilaterally. Pancreatic atrophy. Musculoskeletal: No acute osseous abnormality. IMPRESSION: 1. Right larger than left pleural effusions. Adjacent dependent bibasilar opacities are favored to represent atelectasis. 2. Upper lung ground-glass and nodular airspace disease is favored to represent infection, including atypical etiologies. No posterior predominance within the upper lobes to strongly suggest aspiration. 3. Coronary artery atherosclerosis. Aortic Atherosclerosis (ICD10-I70.0). 4. Minimal septal thickening at the apices, which given pleural fluid, is suspicious for interstitial edema. 5. Mild motion degradation. Electronically Signed   By: Abigail Miyamoto M.D.   On: 08/26/2019 18:03   US Renal  Result Date: 08/26/2019 CLINICAL DATA:  Acute kidney injury. EXAM: RENAL / URINARY TRACT ULTRASOUND COMPLETE COMPARISON:  Abdominal ultrasound 03/28/2014  FINDINGS: Right Kidney: Renal measurements: 10.6 x 5.2 by 5.1 cm = volume: 148 mL. Diffuse mild cortical thinning. Echogenicity within normal limits. No mass or hydronephrosis visualized. Left Kidney: Renal measurements: 10.9 x 5.0 x 5.2 cm = volume: 147 mL. Diffuse cortical thinning. Poorly visualized due to shadowing bowel gas. No definite hydronephrosis or mass. Bladder: Not visualized. Other: Incidentally noted bilateral pleural effusions. IMPRESSION: 1. Poor visualization of the left kidney due to shadowing bowel gas. Bilateral renal cortical thinning. No definite hydronephrosis or mass. 2.  Incidentally noted bilateral pleural effusions. Electronically Signed   By: Audie Pinto M.D.   On: 08/26/2019 14:04  Scheduled Meds:  enoxaparin (LOVENOX) injection  40 mg Subcutaneous Q24H   insulin aspart  0-15 Units Subcutaneous TID WC   insulin aspart  0-5 Units Subcutaneous QHS   mometasone-formoterol  2 puff Inhalation BID   pantoprazole  40 mg Oral Daily   Continuous Infusions:  sodium chloride 100 mL/hr at 08/27/19 0406   ampicillin-sulbactam (UNASYN) IV 3 g (08/27/19 0031)     LOS: 3 days       Hosie Poisson, MD [facility] Pager [phone]  If 7PM-7AM, please contact night-coverage [email or web address] Password [email or web address] 08/27/2019, 10:52 AM

## 2019-08-27 NOTE — Progress Notes (Signed)
CRITICAL VALUE ALERT  Critical Value: Lactic Acid 4.0  Date & Time Notied: 08/27/2019  Provider Notified: Dr Karleen Hampshire  Orders Received/Actions taken: Transfer to progressive

## 2019-08-27 NOTE — Progress Notes (Signed)
CRITICAL VALUE ALERT  Critical Value:  Troponin  Date & Time Notied: 08/27/2019   Provider Notified: Yes, Page Dr. Karleen Hampshire, V  Orders Received/Actions taken: Awaiting response

## 2019-08-27 NOTE — Progress Notes (Signed)
  Speech Language Pathology Treatment: Dysphagia  Patient Details Name: Bryan Turner MRN: [id number] DOB: 22-Nov-1940 Today's Date: 08/27/2019 Time: [id number] SLP Time Calculation (min) (ACUTE ONLY): 11 min  Assessment / Plan / Recommendation Clinical Impression  Patient seen for f/u diet tolerance assessment. Upright in chair, alert and pleasant. Able to verbalize MBS results and rationale for modified diet. Reports poor intake however due to thickened liquids and generally poor appetite. SLP provided patient with thickened lemonade which he willingly consumed via cup sip with min cues to slow rate of intake, verbalizing pleasant taste. Consumed only 3-4 bites of banana, also without overt indication of aspiration, before refusing further. Overall, current diet appears to remain appropriate although given pulmonary dx, patient should be monitored closely for tolerance, particularly of free water in between meals. Would consider repeat MBS as respiratory status and general conditioning continue to improve to determine potential to advance diet with compensatory strategies.    HPI HPI: 78 yo admitted with SOB with PNA. PMHx: asthma, DM, macular degeneration      SLP Plan  Continue with current plan of care       Recommendations  Diet recommendations: Regular;Nectar-thick liquid Liquids provided via: Cup Medication Administration: Whole meds with puree Supervision: Intermittent supervision to cue for compensatory strategies Compensations: Slow rate;Small sips/bites;Multiple dry swallows after each bite/sip;Clear throat intermittently Postural Changes and/or Swallow Maneuvers: Seated upright 90 degrees                Oral Care Recommendations: Oral care BID Follow up Recommendations: Home health SLP SLP Visit Diagnosis: Dysphagia, pharyngeal phase (R13.13) Plan: Continue with current plan of care       [place] MA, [place] 08/27/2019, 9:56 AM

## 2019-08-27 NOTE — Progress Notes (Signed)
Patient transferring to [phone], Report called in to [facility].

## 2019-08-27 NOTE — Progress Notes (Signed)
Cardiology Consultation:   Patient ID: Bryan Turner MRN: [id number]; DOB: 1940/12/04  Admit date: 08/22/2019 Date of Consult: 08/27/2019  Primary Care Provider: Lawerance Cruel, MD Primary Cardiologist: No primary care provider on file. New Primary Electrophysiologist:  None    Patient Profile:   Bryan Turner is a 78 y.o. male with a hx of DM who is being seen today for the evaluation of NSTEMI at the request of Dr. Karleen Hampshire.  History of Present Illness:   Bryan Turner is 79 year old gentleman with a history of diabetes mellitus, asthma, hard of hearing, admitted on October 8 with complaints of exertional dyspnea, cough and low-grade fever and was diagnosed with suspected aspiration pneumonia and treated with antibiotics.  COVID-19 testing was negative.  WBC was elevated increased further during his hospitalization, peaking at 29,000.Marland Kitchen  He required oxygen supplementation at 2 L/min.  After IV fluid resuscitation he developed evidence of fluid overload and bilateral pleural effusions.  And bicarbonate for metabolic acidosis.  During his hospitalization echocardiogram was performed which showed severely depressed left ventricular systolic function with an ejection fraction of 30%.  There are no older studies available for review.  This was followed by testing of cardiac enzymes which were abnormal (high-sensitivity troponin 8000 increasing slightly to 9000 today).  Chest CT did show evidence of coronary and aortic atherosclerosis.  Admission ECG did show widespread nonspecific ST segment depression, not present on older tracings.  Sinus and he has a pre-existing right bundle branch block.  At no point did he have any complaints of chest discomfort.  BNP is markedly elevated at approximately 3000.  He has evidence of chronic kidney disease.  Baseline is unclear but during his hospitalization the creatinine has been in the 1.77-2.34 range.  Bryan Turner has a DO NOT RESUSCITATE status, confirmed  by his wife.  Heart Pathway Score:     Past Medical History:  Diagnosis Date  . Asthma   . Diabetes mellitus without complication ([place])   . Hard of hearing    wears hearing aides  . Macular degeneration   . Plantar fasciitis     Past Surgical History:  Procedure Laterality Date  . CHOLECYSTECTOMY N/A 03/28/2014   Procedure: LAPAROSCOPIC CHOLECYSTECTOMY WITH INTRAOPERATIVE CHOLANGIOGRAM;  Surgeon: [name], MD;  Location: [facility];  Service: General;  Laterality: N/A;  . EYE SURGERY    . HEMORRHOID SURGERY       Home Medications:  Prior to Admission medications   Medication Sig Start Date End Date Taking? Authorizing Provider  Fish Oil-Cholecalciferol (FISH OIL + D3 PO) Take 1 capsule by mouth daily.   Yes [provider]  Lutein-Zeaxanthin 15-0.7 MG CAPS Take 1 capsule by mouth daily.   Yes [provider]  metFORMIN (GLUCOPHAGE) 500 MG tablet Take 500 mg by mouth 2 (two) times daily. 04/18/15  Yes [provider]  Multiple Vitamins-Minerals (PRESERVISION AREDS 2 PO) Take 25 mg by mouth. 2 capsules daily   Yes [provider]  multivitamin (ONE-A-DAY MEN'S) TABS tablet Take 1 tablet by mouth daily.   Yes [provider]  saw palmetto 80 MG capsule Take 80 mg by mouth daily.   Yes [provider]  traMADol (ULTRAM) 50 MG tablet Take 1 tablet (50 mg total) by mouth every 6 (six) hours as needed. Patient taking differently: Take 50 mg by mouth See admin instructions. Take one tablet by mouth on Tuesday, Thursday and Fridays 11/24/13  Yes Ashley Murrain, NP  albuterol (  PROAIR HFA) 108 (90 Base) MCG/ACT inhaler Inhale 1 puff into the lungs every 6 (six) hours as needed for shortness of breath. As needed 08/25/19   Geradine Girt, DO  dexamethasone (DECADRON) 4 MG tablet Take 1 tablet (4 mg total) by mouth daily. 08/25/19   Geradine Girt, DO  doxycycline (VIBRA-TABS) 100 MG tablet Take 1 tablet (100 mg total) by mouth every 12  (twelve) hours. 08/25/19   Geradine Girt, DO  ipratropium-albuterol (DUONEB) 0.5-2.5 (3) MG/3ML SOLN Take 3 mLs by nebulization 2 (two) times daily. 08/25/19   Geradine Girt, DO  Maltodextrin-Xanthan Gum ([place]) POWD As need to thicken liquids 08/25/19   Geradine Girt, DO    Inpatient Medications: Scheduled Meds: . aspirin  325 mg Oral Once  . furosemide  40 mg Intravenous Q12H  . insulin aspart  0-15 Units Subcutaneous TID WC  . insulin aspart  0-5 Units Subcutaneous QHS  . insulin aspart  2 Units Subcutaneous TID WC  . mometasone-formoterol  2 puff Inhalation BID  . pantoprazole  40 mg Oral Daily  . sodium bicarbonate  650 mg Oral BID   Continuous Infusions: . ampicillin-sulbactam (UNASYN) IV 3 g (08/27/19 1234)  . heparin     PRN Meds: albuterol, LORazepam, Resource ThickenUp Clear, traMADol  Allergies:    Allergies  Allergen Reactions  . Aspirin     Bleeds easily  . Codeine Swelling    Throat swelling  . Prednisone   . Tamiflu [Oseltamivir] Other (See Comments)    Hallucinations    Social History:   Social History   Socioeconomic History  . Marital status: Married    Spouse name: Not on file  . Number of children: Not on file  . Years of education: Not on file  . Highest education level: Not on file  Occupational History  . Not on file  Social Needs  . Financial resource strain: Not on file  . Food insecurity    Worry: Not on file    Inability: Not on file  . Transportation needs    Medical: Not on file    Non-medical: Not on file  Tobacco Use  . Smoking status: Former Smoker    Quit date: 06/16/1992    Years since quitting: 27.2  . Smokeless tobacco: Never Used  Substance and Sexual Activity  . Alcohol use: No  . Drug use: No  . Sexual activity: Not on file  Lifestyle  . Physical activity    Days per week: Not on file    Minutes per session: Not on file  . Stress: Not on file  Relationships  . Social Product manager on phone: Not on file    Gets together: Not on file    Attends religious service: Not on file    Active member of club or organization: Not on file    Attends meetings of clubs or organizations: Not on file    Relationship status: Not on file  . Intimate partner violence    Fear of current or ex partner: Not on file    Emotionally abused: Not on file    Physically abused: Not on file    Forced sexual activity: Not on file  Other Topics Concern  . Not on file  Social History Narrative  . Not on file    Family History:   History reviewed. No pertinent family history.  There is no family history of premature coronary disease.  ROS:  Please see the history of present illness.   All other ROS reviewed and negative.     Physical Exam/Data:   Vitals:   08/27/19 0358 08/27/19 0752 08/27/19 1635 08/27/19 1838  BP: 121/85 118/77 102/82 112/82  Pulse: 98 (!) 103 93 92  Resp: 18 17 16  (!) 24  Temp: 98.3 F (36.8 C) 97.6 F (36.4 C) 97.8 F (36.6 C) 98.2 F (36.8 C)  TempSrc: Oral Oral Oral Oral  SpO2: 98% 100% 100%   Weight:      Height:        Intake/Output Summary (Last 24 hours) at 08/27/2019 2004 Last data filed at 08/27/2019 1850 Gross per 24 hour  Intake 600 ml  Output 2350 ml  Net -1750 ml   Last 3 Weights 08/24/2019 02/18/2017 04/16/2014  Weight (lbs) 210 lb 12.2 oz 210 lb 200 lb 12.8 oz  Weight (kg) 95.6 kg 95.255 kg 91.082 kg     Body mass index is 28.58 kg/m.  General:  Well nourished, well developed, in no acute distress.  He is lying completely horizontally in bed, breathing comfortably and slowly. HEENT: normal Lymph: no adenopathy Neck: no JVD Endocrine:  No thryomegaly Vascular: No carotid bruits; FA pulses 2+ bilaterally without bruits  Cardiac:  normal S1, S2; RRR; no murmur  Lungs:  clear to auscultation but diminished in the bases bilaterally, no wheezing, rhonchi or rales  Abd: soft, nontender, no hepatomegaly  Ext: no edema Musculoskeletal:   No deformities, BUE and BLE strength normal and equal Skin: warm and dry  Neuro:  CNs 2-12 intact, no focal abnormalities noted Psych:  Normal affect   EKG:  The EKG was personally reviewed and demonstrates: Sinus rhythm, old right bundle branch block, diffuse ST segment depression in most leads Telemetry:  Telemetry was personally reviewed and demonstrates: NS rhythm, occasional PVCs  Relevant CV Studies: Echocardiogram 08/27/2019  1. Left ventricular ejection fraction, by visual estimation, is 30 to 35%. The left ventricle has moderate to severely decreased function. Mildly increased left ventricular size. There is mildly increased left ventricular hypertrophy.  2. Definity contrast agent was given IV to delineate the left ventricular endocardial borders.  3. Elevated mean left atrial pressure.  4. Left ventricular diastolic Doppler parameters are consistent with restrictive filling pattern of LV diastolic filling.  5. Global right ventricle has normal systolic function.The right ventricular size is normal.  6. Left atrial size was normal.  7. Right atrial size was normal.  8. The mitral valve is abnormal. Mild to moderate mitral valve regurgitation. No evidence of mitral stenosis.  9. The tricuspid valve is normal in structure. Tricuspid valve regurgitation moderate. 10. The aortic valve is tricuspid Aortic valve regurgitation is mild by color flow Doppler. Mild aortic valve sclerosis without stenosis. 11. The pulmonic valve was normal in structure. Pulmonic valve regurgitation is trivial by color flow Doppler. 12. Mildly elevated pulmonary artery systolic pressure. 13. The inferior vena cava is dilated in size with <50% respiratory variability, suggesting right atrial pressure of 15 mmHg. 14. Definity used; akinesis of the inferolateral wall with overall moderate to severe LV dysfunction; mild LVH and LVE; restrictive filling; mild AI; mild to moderate MR; moderate TR.   Laboratory  Data:  High Sensitivity Troponin:   Recent Labs  Lab 08/27/19 1714 08/27/19 1821  TROPONINIHS 8,045* 9,101*     Chemistry Recent Labs  Lab 08/26/19 0252 08/27/19 0539 08/27/19 1714  NA 139 141 143  K 5.3* 5.3* 5.2*  CL 108 111 113*  CO2 17* 16* 17*  GLUCOSE 255* 210* 229*  BUN 65* 67* 66*  CREATININE 2.34* 2.23* 2.04*  CALCIUM 8.5* 8.2* 8.1*  GFRNONAA 25* 27* 30*  GFRAA 30* 31* 35*  ANIONGAP 14 14 13     Recent Labs  Lab 08/24/19 0211  PROT 5.9*  ALBUMIN 2.8*  AST 70*  ALT 33  ALKPHOS 49  BILITOT 1.0   Hematology Recent Labs  Lab 08/24/19 0211 08/26/19 0252 08/27/19 0539  WBC 12.7* 29.0* 25.7*  RBC 3.10* 3.45* 3.58*  HGB 9.8* 10.7* 11.1*  HCT 29.8* 33.5* 34.7*  MCV 96.1 97.1 96.9  MCH 31.6 31.0 31.0  MCHC 32.9 31.9 32.0  RDW 13.7 13.9 14.0  PLT 164 284 243   BNP Recent Labs  Lab 09/13/2019 1400 08/27/19 1310  BNP 905.4* 3,028.8*    DDimer No results for input(s): DDIMER in the last 168 hours.   Radiology/Studies:  Dg Chest 2 View  Result Date: 08/25/2019 CLINICAL DATA:  Respiratory distress: likely multifactorial I.e. asthma exacerbation or ?CAP /aspiration or chemical pneumonitis. - COVID 19 neg x2. EXAM: CHEST - 2 VIEW COMPARISON:  Chest radiograph 08/18/2019 FINDINGS: Stable cardiomediastinal contours with normal heart size. There are bilateral right greater than left interstitial opacities similar to prior, possibly representing infection or edema. Small pleural effusions. No acute finding in the visualized skeleton. IMPRESSION: Bilateral right greater than left interstitial opacities and small effusions similar to prior could represent edema or infection. Electronically Signed   By: Audie Pinto M.D.   On: 08/25/2019 15:43   Ct Chest Wo Contrast  Result Date: 08/26/2019 CLINICAL DATA:  Worsening shortness of breath starting 5 days ago. Dry cough. Evaluate for aspiration. History of asthma diabetes. EXAM: CT CHEST WITHOUT CONTRAST  TECHNIQUE: Multidetector CT imaging of the chest was performed following the standard protocol without IV contrast. COMPARISON:  08/25/2019 chest radiograph.  No prior CT. FINDINGS: Mild motion degradation. Cardiovascular: Aortic and branch vessel atherosclerosis. Tortuous thoracic aorta. Borderline cardiomegaly. Multivessel coronary artery atherosclerosis. Mediastinum/Nodes: No middle mediastinal adenopathy. Hilar regions poorly evaluated without intravenous contrast. Normal caliber of the esophagus. Lungs/Pleura: Moderate right and small to moderate left pleural effusion. Presumed secretions throughout the trachea. Patent airways, with compression of lower lobe regions. Bibasilar dependent consolidation. Minimal biapical septal thickening. Bilateral upper lobe peribronchovascular ground-glass and nodular airspace disease. Upper Abdomen: Cholecystectomy. Normal imaged portions of the spleen, stomach, adrenal glands. Mild renal cortical thinning bilaterally. Pancreatic atrophy. Musculoskeletal: No acute osseous abnormality. IMPRESSION: 1. Right larger than left pleural effusions. Adjacent dependent bibasilar opacities are favored to represent atelectasis. 2. Upper lung ground-glass and nodular airspace disease is favored to represent infection, including atypical etiologies. No posterior predominance within the upper lobes to strongly suggest aspiration. 3. Coronary artery atherosclerosis. Aortic Atherosclerosis (ICD10-I70.0). 4. Minimal septal thickening at the apices, which given pleural fluid, is suspicious for interstitial edema. 5. Mild motion degradation. Electronically Signed   By: Abigail Miyamoto M.D.   On: 08/26/2019 18:03   US Renal  Result Date: 08/26/2019 CLINICAL DATA:  Acute kidney injury. EXAM: RENAL / URINARY TRACT ULTRASOUND COMPLETE COMPARISON:  Abdominal ultrasound 03/28/2014 FINDINGS: Right Kidney: Renal measurements: 10.6 x 5.2 by 5.1 cm = volume: 148 mL. Diffuse mild cortical thinning.  Echogenicity within normal limits. No mass or hydronephrosis visualized. Left Kidney: Renal measurements: 10.9 x 5.0 x 5.2 cm = volume: 147 mL. Diffuse cortical thinning. Poorly visualized due to shadowing bowel gas. No definite hydronephrosis or mass. Bladder: Not  visualized. Other: Incidentally noted bilateral pleural effusions. IMPRESSION: 1. Poor visualization of the left kidney due to shadowing bowel gas. Bilateral renal cortical thinning. No definite hydronephrosis or mass. 2.  Incidentally noted bilateral pleural effusions. Electronically Signed   By: Audie Pinto M.D.   On: 08/26/2019 14:04   Dg Swallowing Func-speech Pathology  Result Date: 08/24/2019 Objective Swallowing Evaluation: Type of Study: MBS-Modified Barium Swallow Study  Patient Details Name: MARISA LATHE MRN: [id number] Date of Birth: [birth year] Today's Date: 08/24/2019 Time: SLP Start Time (ACUTE ONLY): [id number] -SLP Stop Time (ACUTE ONLY): 1409 SLP Time Calculation (min) (ACUTE ONLY): 14 min Past Medical History: Past Medical History: Diagnosis Date . Asthma  . Diabetes mellitus without complication ([place])  . Hard of hearing   wears hearing aides . Macular degeneration  . Plantar fasciitis  Past Surgical History: Past Surgical History: Procedure Laterality Date . CHOLECYSTECTOMY N/A 03/28/2014  Procedure: LAPAROSCOPIC CHOLECYSTECTOMY WITH INTRAOPERATIVE CHOLANGIOGRAM;  Surgeon: [name], MD;  Location: [facility];  Service: General;  Laterality: N/A; . EYE SURGERY   . HEMORRHOID SURGERY   HPI: 78 yo admitted with SOB with PNA. PMHx: asthma, DM, macular degeneration  Subjective: alert, upright in chair with spouse at bedside Assessment / Plan / Recommendation CHL IP CLINICAL IMPRESSIONS 08/24/2019 Clinical Impression Pt presents with a mild to moderate pharyngeal dysphagia of unknown etiology. Deficits c/b reduced base of tongue retraction, decreased laryngeal elevation, reduced timing and efficiency of laryngeal vestibule closure,  decreased glottic closure, and diminished sensation. This allowed for: - frequent intermittent silent penetration of thin liquids before the swallow, trace silent aspiration after the swallow - gross silent penetration and aspiration of thin liquids via straw use before the swallow.  Pt with mild vallecular and pyriform sinus residuals that decreased with second swallows. Recommend nectar thick liquids and regular consistencies with medicines in puree and safe swallow precautions. Pt okay for water in between meals following oral care. Educated pt and spouse regarding recommendations and findings. SLP to follow up for further management.  SLP Visit Diagnosis Dysphagia, pharyngeal phase (R13.13) Attention and concentration deficit following -- Frontal lobe and executive function deficit following -- Impact on safety and function Moderate aspiration risk   CHL IP TREATMENT RECOMMENDATION 08/24/2019 Treatment Recommendations Therapy as outlined in treatment plan below   Prognosis 08/24/2019 Prognosis for Safe Diet Advancement Good Barriers to Reach Goals -- Barriers/Prognosis Comment -- CHL IP DIET RECOMMENDATION 08/24/2019 SLP Diet Recommendations Nectar thick liquid;Regular solids Liquid Administration via Cup;No straw Medication Administration Whole meds with puree Compensations Slow rate;Small sips/bites;Multiple dry swallows after each bite/sip;Clear throat intermittently Postural Changes Seated upright at 90 degrees;Remain semi-upright after after feeds/meals (Comment)   CHL IP OTHER RECOMMENDATIONS 08/24/2019 Recommended Consults -- Oral Care Recommendations Oral care BID Other Recommendations Order thickener from pharmacy   CHL IP FOLLOW UP RECOMMENDATIONS 08/24/2019 Follow up Recommendations Outpatient SLP   CHL IP FREQUENCY AND DURATION 08/24/2019 Speech Therapy Frequency (ACUTE ONLY) min 2x/week Treatment Duration 1 week      CHL IP ORAL PHASE 08/24/2019 Oral Phase WFL Oral - Pudding Teaspoon -- Oral - Pudding Cup  -- Oral - Honey Teaspoon -- Oral - Honey Cup -- Oral - Nectar Teaspoon -- Oral - Nectar Cup -- Oral - Nectar Straw -- Oral - Thin Teaspoon -- Oral - Thin Cup -- Oral - Thin Straw -- Oral - Puree -- Oral - Mech Soft -- Oral - Regular -- Oral - Multi-Consistency -- Oral - Pill -- Oral Phase -  Comment --  CHL IP PHARYNGEAL PHASE 08/24/2019 Pharyngeal Phase Impaired Pharyngeal- Pudding Teaspoon -- Pharyngeal -- Pharyngeal- Pudding Cup -- Pharyngeal -- Pharyngeal- Honey Teaspoon -- Pharyngeal -- Pharyngeal- Honey Cup -- Pharyngeal -- Pharyngeal- Nectar Teaspoon -- Pharyngeal -- Pharyngeal- Nectar Cup Reduced tongue base retraction;Reduced laryngeal elevation;Pharyngeal residue - valleculae;Pharyngeal residue - pyriform Pharyngeal -- Pharyngeal- Nectar Straw -- Pharyngeal -- Pharyngeal- Thin Teaspoon -- Pharyngeal Material does not enter airway Pharyngeal- Thin Cup Reduced airway/laryngeal closure;Reduced epiglottic inversion;Trace aspiration;Penetration/Aspiration before swallow;Penetration/Apiration after swallow;Reduced tongue base retraction;Reduced laryngeal elevation Pharyngeal Material enters airway, CONTACTS cords and not ejected out;Material enters airway, remains ABOVE vocal cords and not ejected out;Material enters airway, passes BELOW cords without attempt by patient to eject out (silent aspiration) Pharyngeal- Thin Straw Moderate aspiration;Reduced epiglottic inversion;Reduced airway/laryngeal closure;Reduced tongue base retraction;Reduced laryngeal elevation;Penetration/Aspiration before swallow Pharyngeal Material enters airway, passes BELOW cords without attempt by patient to eject out (silent aspiration) Pharyngeal- Puree Reduced laryngeal elevation;Reduced tongue base retraction;Pharyngeal residue - valleculae Pharyngeal -- Pharyngeal- Mechanical Soft -- Pharyngeal -- Pharyngeal- Regular Reduced tongue base retraction;Pharyngeal residue - valleculae Pharyngeal -- Pharyngeal- Multi-consistency --  Pharyngeal -- Pharyngeal- Pill Reduced tongue base retraction;Reduced laryngeal elevation Pharyngeal -- Pharyngeal Comment --  CHL IP CERVICAL ESOPHAGEAL PHASE 08/24/2019 Cervical Esophageal Phase WFL Pudding Teaspoon -- Pudding Cup -- Honey Teaspoon -- Honey Cup -- Nectar Teaspoon -- Nectar Cup -- Nectar Straw -- Thin Teaspoon -- Thin Cup -- Thin Straw -- Puree -- Mechanical Soft -- Regular -- Multi-consistency -- Pill -- Cervical Esophageal Comment -- Chelsea E Hartness MA, CCC-SLP Acute Rehabilitation Services 08/24/2019, 2:47 PM               Assessment and Plan:   1. Left ventricular systolic dysfunction: He looks quite comfortable lying fully supine in bed and is not hypoxic (albeit still on supplementary oxygen).  This suggests some level of chronicity to the disorder.  He cannot receive R AAS inhibitors due to renal dysfunction.  He is not a candidate for invasive coronary angiography.  Lactic acidosis is concerning, but he does not appear toxic and is not in shock. 2. NSTEMI:  There is clear evidence of infarction in the distribution of left circumflex coronary artery, but it is unclear whether this is acute or chronic.  Judging by the fact that the cardiac enzymes are already plateauing, it is likely that he has completed a posterior infarction.  ECG suggested diffuse ischemia and it is also possible he had a demand infarction in the setting of respiratory infection.  A repeat ECG has been ordered but not yet performed.  He he did not have and does not have angina pectoris.  After he recovers from the acute respiratory problem, we can consider nuclear perfusion imaging to evaluate the extent of ischemic myocardium, but he may never be an appropriate candidate for invasive evaluation.  For now will place on intravenous heparin and aspirin.  Lipid-lowering agents to achieve target LDL less than 70.  He is clearly fluid overloaded and I would avoid beta-blockers for now.  Continue diuretics.  Overall  prognosis is poor due to deconditioning/poor functional status.  For questions or updates, please contact [place] Please consult [email or web address] for contact info under     Signed, Sanda Klein, MD  08/27/2019 8:04 PM

## 2019-08-27 NOTE — Progress Notes (Signed)
Inpatient Diabetes Program Recommendations  AACE/ADA: New Consensus Statement on Inpatient Glycemic Control (2015)  Target Ranges:  Prepandial:   less than 140 mg/dL      Peak postprandial:   less than 180 mg/dL (1-2 hours)      Critically ill patients:  140 - 180 mg/dL   Lab Results  Component Value Date   GLUCAP 279 (H) 08/27/2019   HGBA1C 5.7 (H) 08/24/2019    Review of Glycemic Control Results for Bryan Turner, Bryan Turner (MRN [id number]) as of 08/27/2019 14:19  Ref. Range 08/26/2019 11:30 08/26/2019 16:04 08/26/2019 21:12 08/27/2019 06:39 08/27/2019 11:52  Glucose-Capillary Latest Ref Range: 70 - 99 mg/dL 251 (H) 205 (H) 161 (H) 185 (H) 279 (H)   Diabetes history: DM 2 Outpatient Diabetes medications:  Metformin 500 mg bid Current orders for Inpatient glycemic control:  Novolog moderate tid with meals and HS  Inpatient Diabetes Program Recommendations:    Consider adding Lantus 10 units daily while patient is in the hospital.    Thanks  Adah Perl, RN, BC-ADM Inpatient Diabetes Coordinator Pager [phone] (8a-5p)

## 2019-08-27 NOTE — Progress Notes (Signed)
ANTICOAGULATION CONSULT NOTE - Initial Consult  Pharmacy Consult for Heparin Indication: chest pain/ACS  Allergies  Allergen Reactions  . Aspirin     Bleeds easily  . Codeine Swelling    Throat swelling  . Prednisone   . Tamiflu [Oseltamivir] Other (See Comments)    Hallucinations    Patient Measurements: Height: 6' (182.9 cm) Weight: 210 lb 12.2 oz (95.6 kg) IBW/kg (Calculated) : 77.6 Heparin Dosing Weight: 95 kg  Vital Signs: Temp: 98.2 F (36.8 C) (10/12 1838) Temp Source: Oral (10/12 1838) BP: 112/82 (10/12 1838) Pulse Rate: 92 (10/12 1838)  Labs: Recent Labs    08/26/19 0252 08/27/19 0539 08/27/19 1714 08/27/19 1821  HGB 10.7* 11.1*  --   --   HCT 33.5* 34.7*  --   --   PLT 284 243  --   --   CREATININE 2.34* 2.23* 2.04*  --   TROPONINIHS  --   --  8,045* 9,101*    Estimated Creatinine Clearance: 35.2 mL/min (A) (by C-G formula based on SCr of 2.04 mg/dL (H)).   Medical History: Past Medical History:  Diagnosis Date  . Asthma   . Diabetes mellitus without complication ([place])   . Hard of hearing    wears hearing aides  . Macular degeneration   . Plantar fasciitis     Medications:  Facility-Administered Medications Prior to Admission  Medication Dose Route Frequency Provider Last Rate Last Dose  . triamcinolone acetonide (KENALOG) 10 MG/ML injection 10 mg  10 mg Other Once Wallene Huh, DPM       Medications Prior to Admission  Medication Sig Dispense Refill Last Dose  . Fish Oil-Cholecalciferol (FISH OIL + D3 PO) Take 1 capsule by mouth daily.   09/06/2019 at Unknown time  . Lutein-Zeaxanthin 15-0.7 MG CAPS Take 1 capsule by mouth daily.   08/27/2019 at Unknown time  . metFORMIN (GLUCOPHAGE) 500 MG tablet Take 500 mg by mouth 2 (two) times daily.  1 08/22/2019 at Unknown time  . Multiple Vitamins-Minerals (PRESERVISION AREDS 2 PO) Take 25 mg by mouth. 2 capsules daily   08/22/2019 at Unknown time  . multivitamin (ONE-A-DAY MEN'S) TABS tablet Take  1 tablet by mouth daily.   09/08/2019 at Unknown time  . saw palmetto 80 MG capsule Take 80 mg by mouth daily.   08/17/2019 at Unknown time  . traMADol (ULTRAM) 50 MG tablet Take 1 tablet (50 mg total) by mouth every 6 (six) hours as needed. (Patient taking differently: Take 50 mg by mouth See admin instructions. Take one tablet by mouth on Tuesday, Thursday and Fridays) 15 tablet 0 Past Week at Unknown time  . [DISCONTINUED] PROAIR HFA 108 (90 BASE) MCG/ACT inhaler Inhale 1 puff into the lungs every 6 (six) hours as needed for shortness of breath. As needed   Past Month at Unknown time    Assessment: 78 yo M presented with SOB and started on IV abx for presumed PNA.  SOB worsening today.  Pt appeared volume overloaded and CT scan showed bilateral pleural effusions.  IV Lasix, ECHO, and troponin ordered.  Troponin elevated 8045 > 9101.  To start heparin for ACS.  Of note, Lovenox 40mg  was given at 1700 today.  Goal of Therapy:  Heparin level 0.3-0.7 units/ml Monitor platelets by anticoagulation protocol: Yes   Plan:  Start heparin at 1300 units/hr - no bolus due to recent Lovenox administration. Heparin level in 8 hours. Heparin level and CBC daily while on heparin.  Guiseppe Flanagan,  Pharm.D., BCPS Clinical Pharmacist  **Pharmacist phone directory can now be found on [email or web address] ([email or web address]).  Listed under [place].  08/27/2019 7:46 PM

## 2019-08-28 ENCOUNTER — Inpatient Hospital Stay (HOSPITAL_COMMUNITY): Payer: Medicare Other

## 2019-08-28 DIAGNOSIS — I4891 Unspecified atrial fibrillation: Secondary | ICD-10-CM

## 2019-08-28 DIAGNOSIS — I2129 ST elevation (STEMI) myocardial infarction involving other sites: Secondary | ICD-10-CM

## 2019-08-28 DIAGNOSIS — I5021 Acute systolic (congestive) heart failure: Secondary | ICD-10-CM

## 2019-08-28 DIAGNOSIS — Z7189 Other specified counseling: Secondary | ICD-10-CM

## 2019-08-28 DIAGNOSIS — Z515 Encounter for palliative care: Secondary | ICD-10-CM

## 2019-08-28 DIAGNOSIS — Z66 Do not resuscitate: Secondary | ICD-10-CM

## 2019-08-28 DIAGNOSIS — R570 Cardiogenic shock: Secondary | ICD-10-CM

## 2019-08-28 LAB — CBC
HCT: 35.5 % — ABNORMAL LOW (ref 39.0–52.0)
Hemoglobin: 11.6 g/dL — ABNORMAL LOW (ref 13.0–17.0)
MCH: 31.4 pg (ref 26.0–34.0)
MCHC: 32.7 g/dL (ref 30.0–36.0)
MCV: 95.9 fL (ref 80.0–100.0)
Platelets: 247 10*3/uL (ref 150–400)
RBC: 3.7 MIL/uL — ABNORMAL LOW (ref 4.22–5.81)
RDW: 13.9 % (ref 11.5–15.5)
WBC: 20.9 10*3/uL — ABNORMAL HIGH (ref 4.0–10.5)
nRBC: 1.7 % — ABNORMAL HIGH (ref 0.0–0.2)

## 2019-08-28 LAB — CULTURE, BLOOD (ROUTINE X 2)
Culture: NO GROWTH
Culture: NO GROWTH

## 2019-08-28 LAB — GLUCOSE, CAPILLARY: Glucose-Capillary: 141 mg/dL — ABNORMAL HIGH (ref 70–99)

## 2019-08-28 LAB — BASIC METABOLIC PANEL
Anion gap: 16 — ABNORMAL HIGH (ref 5–15)
BUN: 63 mg/dL — ABNORMAL HIGH (ref 8–23)
CO2: 18 mmol/L — ABNORMAL LOW (ref 22–32)
Calcium: 8.3 mg/dL — ABNORMAL LOW (ref 8.9–10.3)
Chloride: 112 mmol/L — ABNORMAL HIGH (ref 98–111)
Creatinine, Ser: 2.15 mg/dL — ABNORMAL HIGH (ref 0.61–1.24)
GFR calc Af Amer: 33 mL/min — ABNORMAL LOW (ref >60)
GFR calc non Af Amer: 28 mL/min — ABNORMAL LOW (ref >60)
Glucose, Bld: 141 mg/dL — ABNORMAL HIGH (ref 70–99)
Potassium: 4.4 mmol/L (ref 3.5–5.1)
Sodium: 146 mmol/L — ABNORMAL HIGH (ref 135–145)

## 2019-08-28 LAB — HEPARIN LEVEL (UNFRACTIONATED)
Heparin Unfractionated: 0.33 IU/mL (ref 0.30–0.70)
Heparin Unfractionated: 0.35 IU/mL (ref 0.30–0.70)

## 2019-08-28 LAB — LACTIC ACID, PLASMA: Lactic Acid, Venous: 1.9 mmol/L (ref 0.5–1.9)

## 2019-08-28 MED ORDER — FENTANYL CITRATE (PF) 100 MCG/2ML IJ SOLN: 25.0000 ug | INTRAMUSCULAR | Status: DC | PRN

## 2019-08-28 MED ORDER — ONDANSETRON HCL 4 MG/2ML IJ SOLN: 4.0000 mg | Freq: Four times a day (QID) | INTRAMUSCULAR | Status: DC | PRN

## 2019-08-28 MED ORDER — BIOTENE DRY MOUTH MT LIQD: 15.0000 mL | OROMUCOSAL | Status: DC | PRN

## 2019-08-28 MED ORDER — MOMETASONE FURO-FORMOTEROL FUM 100-5 MCG/ACT IN AERO: 2.0000 | INHALATION_SPRAY | Freq: Two times a day (BID) | RESPIRATORY_TRACT | Status: DC | PRN

## 2019-08-28 MED ORDER — LORAZEPAM 2 MG/ML IJ SOLN
0.5000 mg | INTRAMUSCULAR | Status: DC | PRN
  Administered 2019-08-29 (×2): 0.5 mg via INTRAVENOUS
  Filled 2019-08-28 (×2): qty 1

## 2019-08-28 MED ORDER — FUROSEMIDE 10 MG/ML IJ SOLN
40.0000 mg | Freq: Two times a day (BID) | INTRAMUSCULAR | Status: DC | PRN
  Administered 2019-08-31 – 2019-09-02 (×3): 40 mg via INTRAVENOUS
  Filled 2019-08-28 (×4): qty 4

## 2019-08-28 MED ORDER — FENTANYL CITRATE (PF) 100 MCG/2ML IJ SOLN
25.0000 ug | INTRAMUSCULAR | Status: DC | PRN
  Administered 2019-08-30 (×2): 25 ug via INTRAVENOUS
  Administered 2019-08-30 – 2019-08-31 (×3): 50 ug via INTRAVENOUS
  Filled 2019-08-28 (×5): qty 2

## 2019-08-28 MED ORDER — AMIODARONE HCL IN DEXTROSE 360-4.14 MG/200ML-% IV SOLN: 30.0000 mg/h | INTRAVENOUS | Status: DC

## 2019-08-28 MED ORDER — AMIODARONE HCL IN DEXTROSE 360-4.14 MG/200ML-% IV SOLN
60.0000 mg/h | INTRAVENOUS | Status: DC
  Filled 2019-08-28: qty 200

## 2019-08-28 MED ORDER — GLYCOPYRROLATE 0.2 MG/ML IJ SOLN
0.3000 mg | INTRAMUSCULAR | Status: DC | PRN
  Administered 2019-09-01: 14:00:00 0.3 mg via INTRAVENOUS
  Filled 2019-08-28 (×2): qty 2

## 2019-08-28 MED ORDER — AMIODARONE LOAD VIA INFUSION
150.0000 mg | Freq: Once | INTRAVENOUS | Status: DC
  Filled 2019-08-28: qty 83.34

## 2019-08-28 MED ORDER — POLYVINYL ALCOHOL 1.4 % OP SOLN
1.0000 [drp] | Freq: Four times a day (QID) | OPHTHALMIC | Status: DC | PRN
  Filled 2019-08-28: qty 15

## 2019-08-28 MED ORDER — ACETAMINOPHEN 650 MG RE SUPP: 650.0000 mg | Freq: Four times a day (QID) | RECTAL | Status: DC | PRN

## 2019-08-28 NOTE — Progress Notes (Addendum)
Progress Note  Patient Name: Bryan Turner Date of Encounter: 08/28/2019  Primary Cardiologist: Sanda Klein, MD -New  Subjective   Patient denies any chest discomfort or shortness of breath he says that he actually feels a little better than yesterday but has not been up.  He denies having any palpitations or awareness of being in A. fib.  No lightheadedness.  He did not sleep well last night he says mostly due to chest congestion.  He noted that he was coughing overnight.  Currently not coughing during exam.  Inpatient Medications    Scheduled Meds: . furosemide  40 mg Intravenous Q12H  . insulin aspart  0-15 Units Subcutaneous TID WC  . insulin aspart  0-5 Units Subcutaneous QHS  . insulin aspart  2 Units Subcutaneous TID WC  . mometasone-formoterol  2 puff Inhalation BID  . pantoprazole  40 mg Oral Daily  . sodium bicarbonate  650 mg Oral BID   Continuous Infusions: . ampicillin-sulbactam (UNASYN) IV 3 g (08/28/19 0058)  . heparin 1,300 Units/hr (08/27/19 2121)   PRN Meds: albuterol, LORazepam, Resource ThickenUp Clear, traMADol   Vital Signs    Vitals:   08/28/19 0012 08/28/19 0520 08/28/19 0813 08/28/19 0819  BP: 114/87 115/83 112/82   Pulse: 91 91 92 (!) 115  Resp: (!) 25 (!) 21 (!) 22 (!) 22  Temp: 98.5 F (36.9 C) 98 F (36.7 C) 98.1 F (36.7 C)   TempSrc: Oral Oral Oral   SpO2: 100% 100%  98%  Weight:  96.4 kg    Height:        Intake/Output Summary (Last 24 hours) at 08/28/2019 0932 Last data filed at 08/28/2019 [id number] Gross per 24 hour  Intake 708.03 ml  Output 3175 ml  Net -2466.97 ml   Last 3 Weights 08/28/2019 08/24/2019 02/18/2017  Weight (lbs) 212 lb 8.4 oz 210 lb 12.2 oz 210 lb  Weight (kg) 96.4 kg 95.6 kg 95.255 kg      Telemetry    Was SR 80's-90's with PVCs, converted to atrial fibrillation around 6am, rates 110's-120's with PVCs - Personally Reviewed  ECG    Atrial fibrillation with rapid ventricular response with premature  ventricular or aberrantly conducted complexes, Right bundle branch block - Personally Reviewed  Physical Exam   GEN: No acute distress.   Neck: No JVD Cardiac:  Irregularly irregular rhythm, no murmurs, rubs, or gallops.  Respiratory: Clear to auscultation bilaterally, few scattered rhonchi GI: Soft, nontender, non-distended  MS: No edema; No deformity. Neuro:  Nonfocal  Psych: Normal affect   Labs    High Sensitivity Troponin:   Recent Labs  Lab 08/27/19 1714 08/27/19 1821  TROPONINIHS 8,045* 9,101*      Chemistry Recent Labs  Lab 08/24/19 0211  08/27/19 0539 08/27/19 1714 08/28/19 0353  NA 135   < > 141 143 146*  K 5.1   < > 5.3* 5.2* 4.4  CL 104   < > 111 113* 112*  CO2 18*   < > 16* 17* 18*  GLUCOSE 209*   < > 210* 229* 141*  BUN 31*   < > 67* 66* 63*  CREATININE 1.77*   < > 2.23* 2.04* 2.15*  CALCIUM 8.5*   < > 8.2* 8.1* 8.3*  PROT 5.9*  --   --   --   --   ALBUMIN 2.8*  --   --   --   --   AST 70*  --   --   --   --  ALT 33  --   --   --   --   ALKPHOS 49  --   --   --   --   BILITOT 1.0  --   --   --   --   GFRNONAA 36*   < > 27* 30* 28*  GFRAA 41*   < > 31* 35* 33*  ANIONGAP 13   < > 14 13 16*   < > = values in this interval not displayed.     Hematology Recent Labs  Lab 08/26/19 0252 08/27/19 0539 08/28/19 0353  WBC 29.0* 25.7* 20.9*  RBC 3.45* 3.58* 3.70*  HGB 10.7* 11.1* 11.6*  HCT 33.5* 34.7* 35.5*  MCV 97.1 96.9 95.9  MCH 31.0 31.0 31.4  MCHC 31.9 32.0 32.7  RDW 13.9 14.0 13.9  PLT 284 243 247    BNP Recent Labs  Lab 08/18/2019 1400 08/27/19 1310  BNP 905.4* 3,028.8*     DDimer No results for input(s): DDIMER in the last 168 hours.   Radiology    Ct Chest Wo Contrast  Result Date: 08/26/2019 CLINICAL DATA:  Worsening shortness of breath starting 5 days ago. Dry cough. Evaluate for aspiration. History of asthma diabetes. EXAM: CT CHEST WITHOUT CONTRAST TECHNIQUE: Multidetector CT imaging of the chest was performed following  the standard protocol without IV contrast. COMPARISON:  08/25/2019 chest radiograph.  No prior CT. FINDINGS: Mild motion degradation. Cardiovascular: Aortic and branch vessel atherosclerosis. Tortuous thoracic aorta. Borderline cardiomegaly. Multivessel coronary artery atherosclerosis. Mediastinum/Nodes: No middle mediastinal adenopathy. Hilar regions poorly evaluated without intravenous contrast. Normal caliber of the esophagus. Lungs/Pleura: Moderate right and small to moderate left pleural effusion. Presumed secretions throughout the trachea. Patent airways, with compression of lower lobe regions. Bibasilar dependent consolidation. Minimal biapical septal thickening. Bilateral upper lobe peribronchovascular ground-glass and nodular airspace disease. Upper Abdomen: Cholecystectomy. Normal imaged portions of the spleen, stomach, adrenal glands. Mild renal cortical thinning bilaterally. Pancreatic atrophy. Musculoskeletal: No acute osseous abnormality. IMPRESSION: 1. Right larger than left pleural effusions. Adjacent dependent bibasilar opacities are favored to represent atelectasis. 2. Upper lung ground-glass and nodular airspace disease is favored to represent infection, including atypical etiologies. No posterior predominance within the upper lobes to strongly suggest aspiration. 3. Coronary artery atherosclerosis. Aortic Atherosclerosis (ICD10-I70.0). 4. Minimal septal thickening at the apices, which given pleural fluid, is suspicious for interstitial edema. 5. Mild motion degradation. Electronically Signed   By: Abigail Miyamoto M.D.   On: 08/26/2019 18:03    Cardiac Studies   Echocardiogram 08/27/2019 IMPRESSIONS   1. Left ventricular ejection fraction, by visual estimation, is 30 to 35%. The left ventricle has moderate to severely decreased function. Mildly increased left ventricular size. There is mildly increased left ventricular hypertrophy.  2. Definity contrast agent was given IV to delineate the  left ventricular endocardial borders.  3. Elevated mean left atrial pressure.  4. Left ventricular diastolic Doppler parameters are consistent with restrictive filling pattern of LV diastolic filling.  5. Global right ventricle has normal systolic function.The right ventricular size is normal.  6. Left atrial size was normal.  7. Right atrial size was normal.  8. The mitral valve is abnormal. Mild to moderate mitral valve regurgitation. No evidence of mitral stenosis.  9. The tricuspid valve is normal in structure. Tricuspid valve regurgitation moderate. 10. The aortic valve is tricuspid Aortic valve regurgitation is mild by color flow Doppler. Mild aortic valve sclerosis without stenosis. 11. The pulmonic valve was normal in structure. Pulmonic valve  regurgitation is trivial by color flow Doppler. 12. Mildly elevated pulmonary artery systolic pressure. 13. The inferior vena cava is dilated in size with <50% respiratory variability, suggesting right atrial pressure of 15 mmHg. 14. Definity used; akinesis of the inferolateral wall with overall moderate to severe LV dysfunction; mild LVH and LVE; restrictive filling; mild AI; mild to moderate MR; moderate TR.  Patient Profile     78 y.o. male with a hx of DM, asthma, hard of hearing who presented with exertional dyspnea, cough, low grade fever. He was treated for suspected aspiration pneumonia with IV antibiotics.  COVID testing negative.  EKG showed widespread nonspecific ST depression.  He required supplemental oxygen.  Echocardiogram showed EF 30%, no prior studies for comparison.  BNP elevated at 3000.  Cardiac enzymes were elevated.  He is being treated for NSTEMI   Assessment & Plan    LV systolic dysfunction/acute combined systolic and diastolic heart failure -EF 30% on echocardiogram this admission.  No prior studies for comparison.  BNP was elevated at 3000. -Patient is not a candidate for invasive coronary angiography to further  evaluate. -Patient is currently not a candidate for RAAS inhibitors due to renal dysfunction.  No beta-blocker currently due to decompensation. -Patient is diuresing with Lasix 40 mg IV every 12 hours with good urine output, 3.1 L in the last 24 hours.  He is net +1.6 L fluid balance since admission.  He likely got a good amount of fluids for treatment of his acute respiratory illness. -He was not being weighed daily.  Today's weight is up 2 pounds from admission.  Continue strict intake and output and daily weights. -Still somewhat volume overloaded.  Continue current Lasix.  NSTEMI -Evidence of infarction in the distribution of the left coronary artery, but it is unclear whether this is acute or chronic. -With plateaued cardiac enzymes, likely the patient has completed a posterior infarction. -Patient may have had an acute event but also may have some demand ischemia in the setting of acute respiratory infection.  Patient placed on intravenous heparin and aspirin.  Also started on statin.  Avoiding beta-blockers for now due to volume overload. -Overall prognosis is poor due to deconditioning/poor functional status.  The patient is a DNR status.  Paroxysmal atrial fibrillation -Patient developed atrial fibrillation in the 110s -120's this morning around 6 AM. Likely driven by current acure respiratory illness, but unknown if he has had afib in the past.  -Patient is asymptomatic with no awareness of being in A. fib. Has not been up moving around. Blood pressure is soft but stable. -BP on my entering room was 85. On recheck 110/78. Will start IV amiodarone for rate control with soft BP.  -CHA2DS2/VAS Stroke Risk Score is at least 4-5 (CHF, DM, Age (2), probable vasc dz). He has IV heparin infusing for NSTEMI.  -Would consider DOAC prior to discharge. With Eliquis he qualifies for full dose at present, but he turns 66 in April and would then need reduced dose due to renal function and age.   Acute  respiratory failure with hypoxia secondary to aspiration pneumonia and acute CHF -Management per primary team, on IV antibiotics and supplemental oxygen -WBCs up to 29.0, 20.9 today.   AKI likely on chronic kidney disease -Unknown baseline renal function. -Serum creatinine has been 1.96-2.34 during hospitalization.  Creatinine is 2.15 today.  Diabetes type 2 -A1c is 5.7 indicating good control.  Blood sugars have been elevated in the hospital, possibly related to illness and  steroids.   For questions or updates, please contact [place] Please consult [email or web address] for contact info under        Signed, Bryan Perch, NP  08/28/2019, 9:32 AM     I have seen and examined the patient along with Bryan Perch, NP .  I have reviewed the chart, notes and new data.  I agree with PA/NP's note.  Key new complaints: he denies chest pain, but his wife reports several episodes of chest pain ("indigestion") at home over the last 3-4 weeks, including a particularly bad episode 9 days ago (was also short of breath, which he blamed on "asthma"). She reports that his breathing has deteriorated ever since, including orthopnea, but he refused to seek medical attention until she called EMS on the day of admission. Key examination changes: irregular rhythm, he has bilateral leg edema, hard to see JVD, lungs are clear, S3 present. Fingers and toes are a little cool. Key new findings / data: CXR just performed: on my review shows CHF. Creatinine stable at 2.1 (increased from his baseline, but no change since yesterday), excellent UO after diuretics yesterday.  PLAN: Completed posterior myocardial infarction, complicated by acute systolic CHF and AFib w RVR.. BP normal, but some signs of low cardiac output. Avoid beta blockers.  I would also avoid inotropes since they will worsen his tachyarrhythmia and since he seems to be holding his own for now. He is receiving more diuretic. Watch BP and renal  function carefully. Prognosis is very guarded. The palliative care team has been consulted, which is very appropriate. The patient and his wife reaffirm desire for full DNR status.  Sanda Klein, MD, [place] [phone] 08/28/2019, 11:32 AM

## 2019-08-28 NOTE — Care Management Important Message (Signed)
Important Message  Patient Details  Name: Bryan Turner MRN: [id number] Date of Birth: [birth year]   Medicare Important Message Given:  Yes     [name] 08/28/2019, 12:59 PM

## 2019-08-28 NOTE — Progress Notes (Signed)
Pt.converted to Afib .with HR =120'S.Pt.is asymptomatic;MD   on call Baltazar Najjar made aware & ordered  Stat EKG

## 2019-08-28 NOTE — Progress Notes (Signed)
Responded to palliative spiritual care consult. PT Bryan Turner) was alert, but seemingly tired. His wife was at bedside. She informed that he has been put on hospice and are awaiting the next steps. Also, Davonn spoke little and sung a song of worship. His Wife was in tears and was emotionally thankful for the Chaplain presence, since they were not able to have their pastor visit. They said they have a prayer support group from their home [place].   I offered spiritual care with words of comfort, empathic listening, ministry of presence, and prayer.  Chaplain Resident  Fidel Levy  [phone]

## 2019-08-28 NOTE — Progress Notes (Signed)
[facility] for Heparin Indication: chest pain/ACS  Allergies  Allergen Reactions  . Aspirin     Bleeds easily  . Codeine Swelling    Throat swelling  . Prednisone   . Tamiflu [Oseltamivir] Other (See Comments)    Hallucinations    Patient Measurements: Height: 6' (182.9 cm) Weight: 210 lb 12.2 oz (95.6 kg) IBW/kg (Calculated) : 77.6 Heparin Dosing Weight: 95 kg  Vital Signs: Temp: 98.5 F (36.9 C) (10/13 0012) Temp Source: Oral (10/13 0012) BP: 114/87 (10/13 0012) Pulse Rate: 91 (10/13 0012)  Labs: Recent Labs    08/26/19 0252 08/27/19 0539 08/27/19 1714 08/27/19 1821 08/28/19 0353  HGB 10.7* 11.1*  --   --  11.6*  HCT 33.5* 34.7*  --   --  35.5*  PLT 284 243  --   --  247  HEPARINUNFRC  --   --   --   --  0.33  CREATININE 2.34* 2.23* 2.04*  --   --   TROPONINIHS  --   --  8,045* 9,101*  --     Estimated Creatinine Clearance: 35.2 mL/min (A) (by C-G formula based on SCr of 2.04 mg/dL (H)).  Assessment: 78 yo M presented with SOB and started on IV abx for presumed PNA.  SOB worsening today.  Pt appeared volume overloaded and CT scan showed bilateral pleural effusions.  IV Lasix, ECHO, and troponin ordered.  Troponin elevated 8045 > 9101.  To start heparin for NSTEMI.  Of note, Lovenox 40mg  was given at 1700 today.  Heparin level therapeutic (0.33) on gtt at 1300 units/hr. No bleeding noted.  Goal of Therapy:  Heparin level 0.3-0.7 units/ml Monitor platelets by anticoagulation protocol: Yes   Plan:  Continue heparin at 1300 units/hr  F/u confirmatory heparin level in 6 hours  Sherlon Handing, PharmD, BCPS  **Pharmacist phone directory can now be found on [email or web address] ([email or web address]).  Listed under [place].  08/28/2019 4:50 AM

## 2019-08-28 NOTE — Progress Notes (Signed)
PT Cancellation Note  Patient Details Name: Bryan Turner MRN: [id number] DOB: 1941-03-29   Cancelled Treatment:    Reason Eval/Treat Not Completed: Medical issues which prohibited therapy. Pt with a-fib and starting new medicine. RN requested to see pt this afternoon.  Will check back as schedule permits.   Galen Manila 08/28/2019, 10:25 AM

## 2019-08-28 NOTE — Consult Note (Signed)
Consultation Note Date: 08/28/2019   Patient Name: Bryan Turner  DOB: 08-19-1941  MRN: [id number]  Age / Sex: 78 y.o., male   PCP: Lawerance Cruel, MD Referring Physician: Hosie Poisson, MD   REASON FOR CONSULTATION:Establishing goals of care  Palliative Care consult requested for this 78 y.o. male with multiple medical problems including asthma, diabetes mellitus type 2, hard of hearing, and macular degeneration. He presented to ED with complaints of worsening shortness of breath, decreased appetite, weakness, and temperature of 99.9. During his ED work-up WBC 13.6. He required 2L oxygen support. Chest x-ray showed bilateral interstitial pulmonary opacity most conspicuous in the perihilar right lung with trace pleural effusion, findings consistent with edema or infection.  COVID-19 testing was negative. He was started on steroids, Rocephin, and Zithromax. ECG showed evidence of infarction. Cardiology following for NSTEMI (troponin 9101)and combined CHF (BNP 3000). He has also developed atrial fibrillation. Patient remains weak with overall poor prognosis. Palliative Medicine for goals of care discussion.   Clinical Assessment and Goals of Care: I have reviewed medical records including lab results, imaging, Epic notes, and MAR, received report from the bedside RN, and assessed the patient. I met at the bedside with patient and his wife, [name]  to discuss diagnosis prognosis, [place], EOL wishes, disposition and options. Wife [name] also called their children [name] and [name] to be involved in discussion. Mr. Christmas is somewhat somnolent but easily aroused. Denies pain or discomfort. States he did not sleep well and is very tired. Requesting water without thickner.   I introduced Palliative Medicine as specialized medical care for people living with serious illness. It focuses on providing relief from the symptoms and stress of a serious illness. The goal is to improve quality of life for  both the patient and the family. Wife is tearful verbalizing appreciation in our involvement.   We discussed a brief life review of the patient, along with his functional and nutritional status. Wife reports patient and she have been married for 39 years. They have 2 children, [name] and [place] and 5 grandchildren. He is of [place] faith and enjoys spending time with family and friends.   Prior to admission patient was independent of ADLs. Ambulated with a cane at times. Wife reports patient had a noticeable decline in health about 2-3 weeks ago which she thinks is when he had the initial heart attack. She states for about a week he complained of weakness and indigestion. She attempted to seek medical attention but he declined. She reports over the past 2 weeks he became extremely weak, not eating much, sleeping 90% of the time, and "just pitiful".   We discussed His current illness and what it means in the larger context of His on-going co-morbidities. With specific discussions regarding his cardiac state of health, renal failure, and overall functional and nutritional decline. Natural disease trajectory and expectations at EOL were discussed. Mrs. Kirkpatrick was tearful expressing they had received updates from Cardiology and new his prognosis was poor. Daughter also confirmed as she spoke with Cardiology earlier also. Family states patient would not want aggressive interventions such as surgery or HD.   Family mutually share they wish for him not to suffer or continue with aggressive medical interventions and medications knowing he will continue to decline and have a poor quality of life. Wife shares quality of life and family meant everything to patient and he shared with her on many occassions and most recently today that he  did not wish to live in his current state of health. Emotional support provided.   Mr. Mellinger opens his eyes and states "I am dying and God is calling for me! Whether I go or whether I  stay I will be a winner either way!" Emotional support provided to wife as she was tearful. Son and daughter also tearful via phone.   Mrs. Hoffmann explained to him that we were discussing his care and they were going to honor his wishes and transition his care to comfort allowing him to pass away peacefully and naturally. He smiled stating "thank you and expressing his love for her and his family!" I asked did he understand our discussions and he stated yes he had been listening and agreed. He states "I want to be comfortable and I hope I pass away soon, but before I go can I please have some regular ice water!" Support given.   I educated family and patient on risk of aspiration however, given wishes are to transition care to full comfort, he will be allowed to have thin liquids for comfort. He smiled and expressed his appreciation. Family verbalized their awareness and appreciation. I provided patient with some ice water. Some coughing noted after a few sips however, he was thankful and expressed that was all he needed was a few sips.   I attempted to elicit values and goals of care important to the patient.    The difference between aggressive medical intervention and comfort care was considered in light of the patient's goals of care. I educated patient/family on what comfort care measures would look like. Family aware patient would no longer receive aggressive medical interventions such as continuous vital signs, lab work, radiology testing, or medications not focused on comfort. All care will focus on how he is looking and feeling. This will include management of any symptoms that may cause discomfort, pain, shortness of breath, cough, nausea, agitation, anxiety, and/or secretions etc. Symptoms will be managed with medications and other non-pharmacological interventions such as spiritual support if requested, repositioning, music therapy, or therapeutic listening. Family verbalized understanding and  appreciation.   Patient and wife confirmed DNR/DNI. Wife again confirmed wishes to transition care to full comfort for EOL. I discussed with her patient may potentially transfer to a different unit or may remain in his current room depending on bed needs and availability.   I discussed with family anticipated hospital death versus home with hospice or [facility]. Family open to [facility] but would like to see how it goes over the next few days and make sure symptoms are effectively managed. Wife is hopeful for hospital death.   I discussed given patient's status is now comfort/EOL. His children may be at the bedside for support. [name] and Brad tearfully verbalized their appreciation as they were worried they would not get to be here during his last moments. Support given and they are aware they are now allowed to visit given these exceptions. Brief screening and education regarding our COVID policy. Both children denied COVID diagnosis, any recent contact with     Questions and concerns were addressed. The family was encouraged to call with questions or concerns.  PMT will continue to support holistically.   SOCIAL HISTORY:     reports that he quit smoking about 27 years ago. He has never used smokeless tobacco. He reports that he does not drink alcohol or use drugs.  CODE STATUS: DNR  ADVANCE DIRECTIVES:  Bebe Shaggy (Wife)  SYMPTOM MANAGEMENT: see below   Palliative Prophylaxis:   Aspiration, Bowel Regimen, Delirium Protocol, Frequent Pain Assessment and Oral Care  PSYCHO-SOCIAL/SPIRITUAL:  Support System: Family   Desire for further Chaplaincy support:Yes   Additional Recommendations (Limitations, Scope, Preferences):  Full Comfort Care   PAST MEDICAL HISTORY: Past Medical History:  Diagnosis Date   Asthma    Diabetes mellitus without complication ([place])    Hard of hearing    wears hearing aides   Macular degeneration    Plantar fasciitis     PAST SURGICAL  HISTORY:  Past Surgical History:  Procedure Laterality Date   CHOLECYSTECTOMY N/A 03/28/2014   Procedure: LAPAROSCOPIC CHOLECYSTECTOMY WITH INTRAOPERATIVE CHOLANGIOGRAM;  Surgeon: [name], MD;  Location: [facility];  Service: General;  Laterality: N/A;   EYE SURGERY     HEMORRHOID SURGERY      ALLERGIES:  is allergic to aspirin; codeine; prednisone; and tamiflu [oseltamivir].   MEDICATIONS:  Current Facility-Administered Medications  Medication Dose Route Frequency Provider Last Rate Last Dose   albuterol (PROVENTIL) (2.5 MG/3ML) 0.083% nebulizer solution 2.5 mg  2.5 mg Inhalation Q4H PRN Hosie Poisson, MD   2.5 mg at 08/25/19 1315   amiodarone (NEXTERONE) 1.8 mg/mL load via infusion 150 mg  150 mg Intravenous Once Daune Perch, NP       Followed by   amiodarone (NEXTERONE PREMIX) 360-4.14 MG/200ML-% (1.8 mg/mL) IV infusion  60 mg/hr Intravenous Continuous Daune Perch, NP       Followed by   amiodarone (NEXTERONE PREMIX) 360-4.14 MG/200ML-% (1.8 mg/mL) IV infusion  30 mg/hr Intravenous Continuous Daune Perch, NP       Ampicillin-Sulbactam (UNASYN) 3 g in sodium chloride 0.9 % 100 mL IVPB  3 g Intravenous Q12H Hosie Poisson, MD 200 mL/hr at 08/28/19 0058 3 g at 08/28/19 0058   furosemide (LASIX) injection 40 mg  40 mg Intravenous Q12H Hosie Poisson, MD   40 mg at 08/28/19 0448   heparin ADULT infusion 100 units/mL (25000 units/232m sodium chloride 0.45%)  1,300 Units/hr Intravenous Continuous Hammons, Kimberly B, RPH 13 mL/hr at 08/27/19 2121 1,300 Units/hr at 08/27/19 2121   insulin aspart (novoLOG) injection 0-15 Units  0-15 Units Subcutaneous TID WC AHosie Poisson MD   2 Units at 08/28/19 0830   insulin aspart (novoLOG) injection 0-5 Units  0-5 Units Subcutaneous QHS AHosie Poisson MD       insulin aspart (novoLOG) injection 2 Units  2 Units Subcutaneous TID WC AHosie Poisson MD   2 Units at 08/28/19 0830   LORazepam (ATIVAN) injection 0.5 mg  0.5 mg  Intravenous Q6H PRN AHosie Poisson MD   0.5 mg at 08/26/19 1705   mometasone-formoterol (DULERA) 100-5 MCG/ACT inhaler 2 puff  2 puff Inhalation BID AHosie Poisson MD   2 puff at 08/28/19 0819   pantoprazole (PROTONIX) EC tablet 40 mg  40 mg Oral Daily AHosie Poisson MD   40 mg at 08/28/19 [place]   Oral PRN AHosie Poisson MD       sodium bicarbonate tablet 650 mg  650 mg Oral BID AHosie Poisson MD   650 mg at 08/28/19 0947   traMADol (ULTRAM) tablet 50 mg  50 mg Oral Q6H PRN AHosie Poisson MD        VITAL SIGNS: BP 112/82 (BP Location: Right Arm)    Pulse (!) 115    Temp 98.1 F (36.7 C) (Oral)    Resp (!) 22    Ht 6' (1.829  m)    Wt 96.4 kg    SpO2 98%    BMI 28.82 kg/m  Filed Weights   08/24/19 0130 08/28/19 0520  Weight: 95.6 kg 96.4 kg    Estimated body mass index is 28.82 kg/m as calculated from the following:   Height as of this encounter: 6' (1.829 m).   Weight as of this encounter: 96.4 kg.  LABS: CBC:    Component Value Date/Time   WBC 20.9 (H) 08/28/2019 0353   HGB 11.6 (L) 08/28/2019 0353   HCT 35.5 (L) 08/28/2019 0353   PLT 247 08/28/2019 0353   Comprehensive Metabolic Panel:    Component Value Date/Time   NA 146 (H) 08/28/2019 0353   K 4.4 08/28/2019 0353   CO2 18 (L) 08/28/2019 0353   BUN 63 (H) 08/28/2019 0353   CREATININE 2.15 (H) 08/28/2019 0353   ALBUMIN 2.8 (L) 08/24/2019 0211     Review of Systems  Constitutional: Positive for fatigue.  Respiratory: Positive for shortness of breath.   Neurological: Positive for weakness.  Unless otherwise noted, a complete review of systems is negative.  Physical Exam General: NAD, frail ill-appearing Cardiovascular: irregularly irregular rate and rhythm Pulmonary: rhonchi  Abdomen: soft, nontender, + bowel sounds Extremities: no edema, no joint deformities Skin: no rashes, warm, dry, intact  Neurological: somnolent, easily aroused, A&Ox 3, weak   Prognosis: Days-in the  setting of full comfort, a-fib, diabetes, combined CHF, EF 30%, NSTEMI, acute respiratory failure with hypoxia secondary to aspiration pneumonia, acute on chronic kidney disease, generalized weakness, poor po intake.   Discharge Planning:  Anticipated Hospital Death versus [facility]   Recommendations:  DNR/DNI-as confirmed   Transition to full comfort   Family/Patient expressed wishes are for comfort and EOL care. No aggressive medical interventions. Awareness of possible hospital death versus transfer to [facility] pending bed availability and stability of symptom management.   All non-comfort orders will be d/c'd  Fentanyl PRN for pain/dyspnea (Codeine allergy (throat swelling) and renal impairment)  Robinul PRN for excessive secretions  Ativan PRN for agitation  Liquifilm tears PRN for dry eyes  Continue Lasix PRN for dyspnea/fluid overload  Patient may have comfort feeds. This includes thin liquids with patient and family awareness of aspiration risk.   Unrestricted visitations of family   PMT will continue to support and follow    Palliative Performance Scale: PPS 20%               Wife and children expressed understanding and was in agreement with this plan.   Thank you for allowing the Palliative Medicine Team to assist in the care of this patient.  Time In: 1055 Time Out: 1215 Time Total: 80 min.   Visit consisted of counseling and education dealing with the complex and emotionally intense issues of symptom management and palliative care in the setting of serious and potentially life-threatening illness.Greater than 50%  of this time was spent counseling and coordinating care related to the above assessment and plan.  Signed by:  Alda Lea, AGPCNP-BC Palliative Medicine Team  Phone: [phone] Fax: [phone] Pager: [phone] Amion: Bjorn Pippin

## 2019-08-28 NOTE — Progress Notes (Signed)
PROGRESS NOTE    Bryan Turner  [id number] DOB: [birth year] DOA: 08/17/2019 PCP: Lawerance Cruel, MD   Brief Narrative:   78 year old gentleman prior history of type 2 diabetes mellitus, asthma presents to ED on 10/8/20204 progressive shortness of breath associated with dry cough and fever.  His COVID 19 screening test was negative on admission.  Revision of acute respiratory failure with hypoxia probably secondary to aspiration pneumonia. Over the course of hospitalization his creatinine continues to increase from 1.6-2.2 today.   patient appears fluid overloaded  And CT chest without contrast shows right greater than left bilateral pleural effusions. IV fluids were stopped and he was started on IV lasix, and echocardiogram obtained, it showed LVEF of 30% with regional wall abnormalities consistent with posterior wall infarction. His troponin's were elevated too cardiology consulted and he was started on IV heparin.   Meanwhile palliative care consulted for goals of care. Family had [place] meeting and the patient an family decided to transition to comfort care.  Assessment & Plan:   Principal Problem:   Respiratory distress Active Problems:   Pneumonia   Leukocytosis   Type 2 diabetes mellitus without complication (HCC)   Asthma, chronic, unspecified asthma severity, with acute exacerbation   CAP (community acquired pneumonia)   Hard of hearing   Diabetes mellitus without complication ([place])   Acute kidney injury ([place])   Non-ST elevation (NSTEMI) myocardial infarction ([place])   Congestive heart failure with left ventricular systolic dysfunction (HCC)   Diabetes mellitus type 2 in nonobese ([place])   Acute respiratory failure with hypoxia secondary to aspiration pneumonia and acute systolic and diastolic CHF, and NSTEMI/ posterior wall infarction.  Pt has completed 4 days of IV antibiotics and was started onVI lasix with good diuresis.  Cardiology consulted and recommendations given.  Meanwhile palliative care consulted and family and pt decided to transition to comfort care .     AKI with metabolic acidosis and mild hyperkalemia:  ? Fluid overload vs ATN from aspiration pneumonia.  pts baseline creatinine at 1.34 in 2018.  Creatinine has worsened from 1. 6 to 2.4.  Urine analysis negative for infectio Diuresis with IV lasix 40 mg BID.      Hyperkalemia Resolved.   Type 2 diabetes mellitus with hyperglycemia CBG (last 3)  Recent Labs    08/27/19 1650 08/27/19 2052 08/28/19 0745  GLUCAP 202* 169* 141*   D/c cbg's comfort measures.    Mild normocytic anemia Anemia of chronic disease   Leukocytosis Probably secondary to aspiration pneumonia.   Asthma No wheezing heard continue with [facility] as needed.   DVT prophylaxis: lovenox Code Status:  DNR Family Communication: DISCUSSED with wife and family at bedside.  Disposition Plan: transfer to residential hospice when bed available.    Consultants:   palliative care  Cardiology.   Procedures: CT CHET WITHOUT CONTRAST.  US RENAL.  ECHOCARDIOGRAM.   Antimicrobials: unasyn since admission and discontinued on 10/13  Subjective:  Pt calm and on 2 lit of [place] oxygen.  He is at peace with his decision.  Family members at bedside to provide support.  No complaints.  Objective: Vitals:   08/28/19 0012 08/28/19 0520 08/28/19 0813 08/28/19 0819  BP: 114/87 115/83 112/82   Pulse: 91 91 92 (!) 115  Resp: (!) 25 (!) 21 (!) 22 (!) 22  Temp: 98.5 F (36.9 C) 98 F (36.7 C) 98.1 F (36.7 C)   TempSrc: Oral Oral Oral   SpO2: 100% 100%  98%  Weight:  96.4 kg    Height:        Intake/Output Summary (Last 24 hours) at 08/28/2019 1956 Last data filed at 08/28/2019 1020 Gross per 24 hour  Intake 468.03 ml  Output 1675 ml  Net -1206.97 ml   Filed Weights   08/24/19 0130 08/28/19 0520  Weight: 95.6 kg 96.4 kg    Examination:  General exam: calm and comfortable on 2 lit of [place] oxygen.   Respiratory system: diminished at bases. No wheezing.  Cardiovascular system: s1s2 heard, trace leg edema.  Gastrointestinal system: abd is soft, NT ND BS+ Central nervous system: ALERT and oriented.  Extremities: no pedal edema.  Skin: No rashes, Psychiatry:  Mood appropriate.     Data Reviewed: I have personally reviewed following labs and imaging studies  CBC: Recent Labs  Lab 08/18/2019 1400 08/24/19 0211 08/26/19 0252 08/27/19 0539 08/28/19 0353  WBC 13.6* 12.7* 29.0* 25.7* 20.9*  NEUTROABS 10.0*  --   --   --   --   HGB 10.5* 9.8* 10.7* 11.1* 11.6*  HCT 31.8* 29.8* 33.5* 34.7* 35.5*  MCV 97.5 96.1 97.1 96.9 95.9  PLT 152 164 284 243 [id number]   Basic Metabolic Panel: Recent Labs  Lab 08/25/19 0451 08/26/19 0252 08/27/19 0539 08/27/19 1714 08/28/19 0353  NA 140 139 141 143 146*  K 4.7 5.3* 5.3* 5.2* 4.4  CL 107 108 111 113* 112*  CO2 18* 17* 16* 17* 18*  GLUCOSE 268* 255* 210* 229* 141*  BUN 54* 65* 67* 66* 63*  CREATININE 1.96* 2.34* 2.23* 2.04* 2.15*  CALCIUM 8.6* 8.5* 8.2* 8.1* 8.3*   GFR: Estimated Creatinine Clearance: 33.5 mL/min (A) (by C-G formula based on SCr of 2.15 mg/dL (H)). Liver Function Tests: Recent Labs  Lab 08/24/19 0211  AST 70*  ALT 33  ALKPHOS 49  BILITOT 1.0  PROT 5.9*  ALBUMIN 2.8*   No results for input(s): LIPASE, AMYLASE in the last 168 hours. No results for input(s): AMMONIA in the last 168 hours. Coagulation Profile: No results for input(s): INR, PROTIME in the last 168 hours. Cardiac Enzymes: No results for input(s): CKTOTAL, CKMB, CKMBINDEX, TROPONINI in the last 168 hours. BNP (last 3 results) No results for input(s): PROBNP in the last 8760 hours. HbA1C: No results for input(s): HGBA1C in the last 72 hours. CBG: Recent Labs  Lab 08/27/19 0639 08/27/19 1152 08/27/19 1650 08/27/19 2052 08/28/19 0745  GLUCAP 185* 279* 202* 169* 141*   Lipid Profile: No results for input(s): CHOL, HDL, LDLCALC, TRIG, CHOLHDL,  LDLDIRECT in the last 72 hours. Thyroid Function Tests: Recent Labs    08/27/19 0539  TSH 1.042   Anemia Panel: No results for input(s): VITAMINB12, FOLATE, FERRITIN, TIBC, IRON, RETICCTPCT in the last 72 hours. Sepsis Labs: Recent Labs  Lab 08/26/2019 1459 08/31/2019 2030 08/27/19 1310 08/28/19 1131  PROCALCITON <0.10  --  0.22  --   LATICACIDVEN 1.4 1.4 4.0* 1.9    Recent Results (from the past 240 hour(s))  SARS Coronavirus 2 by RT PCR (hospital order, performed in [facility] hospital lab) Nasopharyngeal Nasopharyngeal Swab     Status: None   Collection Time: 09/04/2019  2:59 PM   Specimen: Nasopharyngeal Swab  Result Value Ref Range Status   SARS Coronavirus 2 NEGATIVE NEGATIVE Final    Comment: (NOTE) If result is NEGATIVE SARS-CoV-2 target nucleic acids are NOT DETECTED. The SARS-CoV-2 RNA is generally detectable in upper and lower  respiratory specimens during the acute phase of infection.  The lowest  concentration of SARS-CoV-2 viral copies this assay can detect is 250  copies / mL. A negative result does not preclude SARS-CoV-2 infection  and should not be used as the sole basis for treatment or other  patient management decisions.  A negative result may occur with  improper specimen collection / handling, submission of specimen other  than nasopharyngeal swab, presence of viral mutation(s) within the  areas targeted by this assay, and inadequate number of viral copies  (<250 copies / mL). A negative result must be combined with clinical  observations, patient history, and epidemiological information. If result is POSITIVE SARS-CoV-2 target nucleic acids are DETECTED. The SARS-CoV-2 RNA is generally detectable in upper and lower  respiratory specimens dur ing the acute phase of infection.  Positive  results are indicative of active infection with SARS-CoV-2.  Clinical  correlation with patient history and other diagnostic information is  necessary to determine  patient infection status.  Positive results do  not rule out bacterial infection or co-infection with other viruses. If result is PRESUMPTIVE POSTIVE SARS-CoV-2 nucleic acids MAY BE PRESENT.   A presumptive positive result was obtained on the submitted specimen  and confirmed on repeat testing.  While 2019 novel coronavirus  (SARS-CoV-2) nucleic acids may be present in the submitted sample  additional confirmatory testing may be necessary for epidemiological  and / or clinical management purposes  to differentiate between  SARS-CoV-2 and other Sarbecovirus currently known to infect humans.  If clinically indicated additional testing with an alternate test  methodology [phone]) is advised. The SARS-CoV-2 RNA is generally  detectable in upper and lower respiratory sp ecimens during the acute  phase of infection. The expected result is Negative. Fact Sheet for Patients:  StrictlyIdeas.no Fact Sheet for Healthcare Providers: BankingDealers.co.za This test is not yet approved or cleared by the [place] FDA and has been authorized for detection and/or diagnosis of SARS-CoV-2 by FDA under an Emergency Use Authorization (EUA).  This EUA will remain in effect (meaning this test can be used) for the duration of the COVID-19 declaration under Section 564(b)(1) of the Act, 21 U.S.C. section 360bbb-3(b)(1), unless the authorization is terminated or revoked sooner. Performed at [facility], [place] [address], [place], [place] [address]   Blood Culture (routine x 2)     Status: None   Collection Time: 08/28/2019  3:30 PM   Specimen: BLOOD RIGHT HAND  Result Value Ref Range Status   Specimen Description BLOOD RIGHT HAND  Final   Special Requests   Final    BOTTLES DRAWN AEROBIC AND ANAEROBIC Blood Culture results may not be optimal due to an inadequate volume of blood received in culture bottles   Culture   Final    NO GROWTH 5 DAYS  Performed at [facility], [place] [address], [place], [place] [address]    Report Status 08/28/2019 FINAL  Final  Blood Culture (routine x 2)     Status: None   Collection Time: 08/28/2019  3:40 PM   Specimen: BLOOD LEFT HAND  Result Value Ref Range Status   Specimen Description BLOOD LEFT HAND  Final   Special Requests   Final    BOTTLES DRAWN AEROBIC AND ANAEROBIC Blood Culture results may not be optimal due to an inadequate volume of blood received in culture bottles   Culture   Final    NO GROWTH 5 DAYS Performed at [facility], [place] [address] [place], [place] [address]  Report Status 08/28/2019 FINAL  Final  Respiratory Panel by PCR     Status: None   Collection Time: 09/15/2019  7:58 PM   Specimen: Nasopharyngeal Swab; Respiratory  Result Value Ref Range Status   Adenovirus NOT DETECTED NOT DETECTED Final   Coronavirus 229E NOT DETECTED NOT DETECTED Final    Comment: (NOTE) The Coronavirus on the Respiratory Panel, DOES NOT test for the novel  Coronavirus (2019 nCoV)    Coronavirus HKU1 NOT DETECTED NOT DETECTED Final   Coronavirus NL63 NOT DETECTED NOT DETECTED Final   Coronavirus OC43 NOT DETECTED NOT DETECTED Final   Metapneumovirus NOT DETECTED NOT DETECTED Final   Rhinovirus / Enterovirus NOT DETECTED NOT DETECTED Final   Influenza A NOT DETECTED NOT DETECTED Final   Influenza B NOT DETECTED NOT DETECTED Final   Parainfluenza Virus 1 NOT DETECTED NOT DETECTED Final   Parainfluenza Virus 2 NOT DETECTED NOT DETECTED Final   Parainfluenza Virus 3 NOT DETECTED NOT DETECTED Final   Parainfluenza Virus 4 NOT DETECTED NOT DETECTED Final   Respiratory Syncytial Virus NOT DETECTED NOT DETECTED Final   Bordetella pertussis NOT DETECTED NOT DETECTED Final   Chlamydophila pneumoniae NOT DETECTED NOT DETECTED Final   Mycoplasma pneumoniae NOT DETECTED NOT DETECTED Final    Comment: Performed at [facility], [place]. [address] [place], [place] [address]  SARS  Coronavirus 2 by RT PCR (hospital order, performed in [facility] hospital lab) Nasopharyngeal Nasopharyngeal Swab     Status: None   Collection Time: 08/22/2019  7:58 PM   Specimen: Nasopharyngeal Swab  Result Value Ref Range Status   SARS Coronavirus 2 NEGATIVE NEGATIVE Final    Comment: (NOTE) If result is NEGATIVE SARS-CoV-2 target nucleic acids are NOT DETECTED. The SARS-CoV-2 RNA is generally detectable in upper and lower  respiratory specimens during the acute phase of infection. The lowest  concentration of SARS-CoV-2 viral copies this assay can detect is 250  copies / mL. A negative result does not preclude SARS-CoV-2 infection  and should not be used as the sole basis for treatment or other  patient management decisions.  A negative result may occur with  improper specimen collection / handling, submission of specimen other  than nasopharyngeal swab, presence of viral mutation(s) within the  areas targeted by this assay, and inadequate number of viral copies  (<250 copies / mL). A negative result must be combined with clinical  observations, patient history, and epidemiological information. If result is POSITIVE SARS-CoV-2 target nucleic acids are DETECTED. The SARS-CoV-2 RNA is generally detectable in upper and lower  respiratory specimens dur ing the acute phase of infection.  Positive  results are indicative of active infection with SARS-CoV-2.  Clinical  correlation with patient history and other diagnostic information is  necessary to determine patient infection status.  Positive results do  not rule out bacterial infection or co-infection with other viruses. If result is PRESUMPTIVE POSTIVE SARS-CoV-2 nucleic acids MAY BE PRESENT.   A presumptive positive result was obtained on the submitted specimen  and confirmed on repeat testing.  While 2019 novel coronavirus  (SARS-CoV-2) nucleic acids may be present in the submitted sample  additional confirmatory testing may be  necessary for epidemiological  and / or clinical management purposes  to differentiate between  SARS-CoV-2 and other Sarbecovirus currently known to infect humans.  If clinically indicated additional testing with an alternate test  methodology [phone]) is advised. The SARS-CoV-2 RNA is generally  detectable in upper and lower  respiratory sp ecimens during the acute  phase of infection. The expected result is Negative. Fact Sheet for Patients:  StrictlyIdeas.no Fact Sheet for Healthcare Providers: BankingDealers.co.za This test is not yet approved or cleared by the [place] FDA and has been authorized for detection and/or diagnosis of SARS-CoV-2 by FDA under an Emergency Use Authorization (EUA).  This EUA will remain in effect (meaning this test can be used) for the duration of the COVID-19 declaration under Section 564(b)(1) of the Act, 21 U.S.C. section 360bbb-3(b)(1), unless the authorization is terminated or revoked sooner. Performed at [facility], [place] [address], [place], [place] [address]   MRSA PCR Screening     Status: None   Collection Time: 08/27/19  6:54 PM   Specimen: Nasopharyngeal  Result Value Ref Range Status   MRSA by PCR NEGATIVE NEGATIVE Final    Comment:        The GeneXpert MRSA Assay (FDA approved for NASAL specimens only), is one component of a comprehensive MRSA colonization surveillance program. It is not intended to diagnose MRSA infection nor to guide or monitor treatment for MRSA infections. Performed at [facility], [place] [address] [place], [place] [address]          Radiology Studies: Dg Chest 2 View  Result Date: 08/28/2019 CLINICAL DATA:  Pt states he recently had a heart attack. Pt denies chest pain at time of imaging. Pt arrived to ED with chest pain, SOB, and A-fib x5 days ago. Hx of DM, asthma. EXAM: CHEST - 2 VIEW COMPARISON:  Chest radiograph 08/25/2019 FINDINGS:  Stable cardiomediastinal contours. Persistent bilateral right-greater-than-left streaky parahilar opacities suspicious for infection or edema. No pneumothorax. Bilateral small/moderate pleural effusions. No acute finding in the visualized skeleton. IMPRESSION: Persistent bilateral right-greater-than-left parahilar opacities, and bilateral pleural effusions, suspicious for infection and/or edema. Electronically Signed   By: Audie Pinto M.D.   On: 08/28/2019 13:36        Scheduled Meds: . pantoprazole  40 mg Oral Daily   Continuous Infusions:    LOS: 4 days       Hosie Poisson, MD [facility] Pager [phone]  If 7PM-7AM, please contact night-coverage [email or web address] Password [facility] 08/28/2019, 7:56 PM

## 2019-08-29 DIAGNOSIS — T17908A Unspecified foreign body in respiratory tract, part unspecified causing other injury, initial encounter: Secondary | ICD-10-CM

## 2019-08-29 NOTE — Progress Notes (Signed)
  Plans for comfort care noted. [facility] will sign off.   Medication Recommendations:  Furosemide 40-80 mg PO once-twice daily as needed for dyspnea or swelling. Other recommendations (labs, testing, etc):  n/a Follow up as an outpatient:  N/a  Sanda Klein, MD, [facility] [phone] office [phone] pager

## 2019-08-29 NOTE — Progress Notes (Signed)
PROGRESS NOTE        PATIENT DETAILS Name: Bryan Turner Age: 78 y.o. Sex: male Date of Birth: [birth year] Admit Date: 08/31/2019 Admitting Physician Rise Patience, MD [id number], Dwyane Luo, MD  Brief Narrative: Patient is a 78 y.o. male of asthma, DM-2, hard of hearing-who presented with progressively worsening shortness of breath-initially thought to have possible pneumonia.  However developed fluid overload-further evaluation revealed significantly non-STEMI and decompensated systolic heart failure.  Given poor functional status/debility and deconditioning-after extensive discussion with palliative care team-and prior physicians-he was transitioned to full comfort measures on 10/13.  See below for further details.    Subjective: Lying comfortably in bed.  Denies any shortness of breath at rest.  Looks very frail and debilitated.  Assessment/Plan: Acute hypoxic respiratory failure secondary to aspiration pneumonia and decompensated systolic heart failure (EF 30-35% on 08/27/2019 by TTE):  Continue IV Lasix for comfort-no longer on any antimicrobial therapy.  Non-STEMI: Seen by cardiology-given frailty/poor functional status-prognosis felt to be poor.  Briefly placed on IV heparin/aspirin/statin-but after discussion with the palliative care team-has been transitioned to full comfort measures.  PAF: Rate controlled-since on comfort measures-doubt any role for long-term anticoagulation.  AKI on CKD 3: Likely hemodynamically mediated-no further work-up needed as comfort measures in effect.  DM-2: Since on comfort measures-no longer getting SSI CBG checks.  No role to resume metformin on discharge.  Asthma: Appears stable-continue with inhalers for comfort.  Palliative care: See above discussion-seen by palliative care team on 10/13-transition to comfort care.  Will await further recommendations from palliative care team for disposition.  Diet: Diet Order             Diet regular Room service appropriate? Yes; Fluid consistency: Thin  Diet effective now        Diet Carb Modified               DVT Prophylaxis: SCD's  Code Status:  DNR  Family Communication: None at bedside  Disposition Plan: Remain inpatient-with recommendations from palliative care/social work for appropriate disposition.  Per palliative care note on 10/13-plans were to see how patient does over the next few days before coming up with appropriate disposition.  Antimicrobial agents: Anti-infectives (From admission, onward)   Start     Dose/Rate Route Frequency Ordered Stop   08/26/19 1230  Ampicillin-Sulbactam (UNASYN) 3 g in sodium chloride 0.9 % 100 mL IVPB  Status:  Discontinued     3 g 200 mL/hr over 30 Minutes Intravenous Every 12 hours 08/26/19 1212 08/28/19 1237   08/25/19 2200  amoxicillin-clavulanate (AUGMENTIN) 500-125 MG per tablet 500 mg  Status:  Discontinued     1 tablet Oral Every 12 hours 08/25/19 1642 08/26/19 1201   08/25/19 1000  doxycycline (VIBRA-TABS) tablet 100 mg  Status:  Discontinued     100 mg Oral Every 12 hours 08/25/19 0936 08/25/19 1642   08/25/19 0000  doxycycline (VIBRA-TABS) 100 MG tablet     100 mg Oral Every 12 hours 08/25/19 1043     08/24/19 1730  azithromycin (ZITHROMAX) tablet 500 mg  Status:  Discontinued     500 mg Oral Daily 08/25/2019 1733 08/25/19 0936   08/24/19 1700  cefTRIAXone (ROCEPHIN) 2 g in sodium chloride 0.9 % 100 mL IVPB  Status:  Discontinued     2 g 200 mL/hr over 30 Minutes  Intravenous Every 24 hours 08/30/2019 1741 08/25/19 0936   08/24/2019 1745  cefTRIAXone (ROCEPHIN) 2 g in sodium chloride 0.9 % 100 mL IVPB  Status:  Discontinued     2 g 200 mL/hr over 30 Minutes Intravenous Every 24 hours 08/21/2019 1733 09/07/2019 1741   08/18/2019 1645  cefTRIAXone (ROCEPHIN) 1 g in sodium chloride 0.9 % 100 mL IVPB     1 g 200 mL/hr over 30 Minutes Intravenous  Once 08/19/2019 1634 08/27/2019 1726   09/10/2019 1645   azithromycin (ZITHROMAX) 500 mg in sodium chloride 0.9 % 250 mL IVPB     500 mg 250 mL/hr over 60 Minutes Intravenous  Once 09/15/2019 1634 09/07/2019 1833      Procedures: None  CONSULTS:  cardiology and Palliative care  Time spent: 25 minutes-Greater than 50% of this time was spent in counseling, explanation of diagnosis, planning of further management, and coordination of care.  MEDICATIONS: Scheduled Meds:  pantoprazole  40 mg Oral Daily   Continuous Infusions: PRN Meds:.acetaminophen, albuterol, antiseptic oral rinse, fentaNYL (SUBLIMAZE) injection, furosemide, glycopyrrolate, LORazepam, mometasone-formoterol, ondansetron (ZOFRAN) IV, polyvinyl alcohol, Resource ThickenUp Clear, traMADol   PHYSICAL EXAM: Vital signs: Vitals:   08/28/19 0819 08/28/19 0830 08/28/19 2025 08/29/19 0652  BP:   107/77 115/82  Pulse: (!) 115  61 95  Resp: (!) 22     Temp:   (!) 97.5 F (36.4 C) 97.6 F (36.4 C)  TempSrc:   Oral Oral  SpO2: 98% 98% 97% 99%  Weight:      Height:       Filed Weights   08/24/19 0130 08/28/19 0520  Weight: 95.6 kg 96.4 kg   Body mass index is 28.82 kg/m.   Gen Exam:Alert awake-not in any distress but very frail and deconditioned HEENT:atraumatic, normocephalic Chest: B/L clear to auscultation anteriorly CVS:S1S2 regular Abdomen:soft non tender, non distended Extremities: 2+ edema Neurology: Non focal-but with generalized weakness Skin: no rash  I have personally reviewed following labs and imaging studies  LABORATORY DATA: CBC: Recent Labs  Lab 09/05/2019 1400 08/24/19 0211 08/26/19 0252 08/27/19 0539 08/28/19 0353  WBC 13.6* 12.7* 29.0* 25.7* 20.9*  NEUTROABS 10.0*  --   --   --   --   HGB 10.5* 9.8* 10.7* 11.1* 11.6*  HCT 31.8* 29.8* 33.5* 34.7* 35.5*  MCV 97.5 96.1 97.1 96.9 95.9  PLT 152 164 284 243 [id number]    Basic Metabolic Panel: Recent Labs  Lab 08/25/19 0451 08/26/19 0252 08/27/19 0539 08/27/19 1714 08/28/19 0353  NA 140 139  141 143 146*  K 4.7 5.3* 5.3* 5.2* 4.4  CL 107 108 111 113* 112*  CO2 18* 17* 16* 17* 18*  GLUCOSE 268* 255* 210* 229* 141*  BUN 54* 65* 67* 66* 63*  CREATININE 1.96* 2.34* 2.23* 2.04* 2.15*  CALCIUM 8.6* 8.5* 8.2* 8.1* 8.3*    GFR: Estimated Creatinine Clearance: 33.5 mL/min (A) (by C-G formula based on SCr of 2.15 mg/dL (H)).  Liver Function Tests: Recent Labs  Lab 08/24/19 0211  AST 70*  ALT 33  ALKPHOS 49  BILITOT 1.0  PROT 5.9*  ALBUMIN 2.8*   No results for input(s): LIPASE, AMYLASE in the last 168 hours. No results for input(s): AMMONIA in the last 168 hours.  Coagulation Profile: No results for input(s): INR, PROTIME in the last 168 hours.  Cardiac Enzymes: No results for input(s): CKTOTAL, CKMB, CKMBINDEX, TROPONINI in the last 168 hours.  BNP (last 3 results) No results for input(s): PROBNP in  the last 8760 hours.  HbA1C: No results for input(s): HGBA1C in the last 72 hours.  CBG: Recent Labs  Lab 08/27/19 0639 08/27/19 1152 08/27/19 1650 08/27/19 2052 08/28/19 0745  GLUCAP 185* 279* 202* 169* 141*    Lipid Profile: No results for input(s): CHOL, HDL, LDLCALC, TRIG, CHOLHDL, LDLDIRECT in the last 72 hours.  Thyroid Function Tests: Recent Labs    08/27/19 0539  TSH 1.042    Anemia Panel: No results for input(s): VITAMINB12, FOLATE, FERRITIN, TIBC, IRON, RETICCTPCT in the last 72 hours.  Urine analysis:    Component Value Date/Time   COLORURINE YELLOW 08/26/2019 1724   APPEARANCEUR HAZY (A) 08/26/2019 1724   LABSPEC 1.028 08/26/2019 1724   PHURINE 5.0 08/26/2019 1724   GLUCOSEU NEGATIVE 08/26/2019 1724   HGBUR MODERATE (A) 08/26/2019 1724   BILIRUBINUR NEGATIVE 08/26/2019 1724   KETONESUR NEGATIVE 08/26/2019 1724   PROTEINUR 30 (A) 08/26/2019 1724   UROBILINOGEN 1.0 03/30/2014 1035   NITRITE NEGATIVE 08/26/2019 1724   LEUKOCYTESUR NEGATIVE 08/26/2019 1724    Sepsis Labs: Lactic Acid, Venous    Component Value Date/Time    LATICACIDVEN 1.9 08/28/2019 1131    MICROBIOLOGY: Recent Results (from the past 240 hour(s))  SARS Coronavirus 2 by RT PCR (hospital order, performed in [place] hospital lab) Nasopharyngeal Nasopharyngeal Swab     Status: None   Collection Time: 09/13/2019  2:59 PM   Specimen: Nasopharyngeal Swab  Result Value Ref Range Status   SARS Coronavirus 2 NEGATIVE NEGATIVE Final    Comment: (NOTE) If result is NEGATIVE SARS-CoV-2 target nucleic acids are NOT DETECTED. The SARS-CoV-2 RNA is generally detectable in upper and lower  respiratory specimens during the acute phase of infection. The lowest  concentration of SARS-CoV-2 viral copies this assay can detect is 250  copies / mL. A negative result does not preclude SARS-CoV-2 infection  and should not be used as the sole basis for treatment or other  patient management decisions.  A negative result may occur with  improper specimen collection / handling, submission of specimen other  than nasopharyngeal swab, presence of viral mutation(s) within the  areas targeted by this assay, and inadequate number of viral copies  (<250 copies / mL). A negative result must be combined with clinical  observations, patient history, and epidemiological information. If result is POSITIVE SARS-CoV-2 target nucleic acids are DETECTED. The SARS-CoV-2 RNA is generally detectable in upper and lower  respiratory specimens dur ing the acute phase of infection.  Positive  results are indicative of active infection with SARS-CoV-2.  Clinical  correlation with patient history and other diagnostic information is  necessary to determine patient infection status.  Positive results do  not rule out bacterial infection or co-infection with other viruses. If result is PRESUMPTIVE POSTIVE SARS-CoV-2 nucleic acids MAY BE PRESENT.   A presumptive positive result was obtained on the submitted specimen  and confirmed on repeat testing.  While 2019 novel coronavirus    (SARS-CoV-2) nucleic acids may be present in the submitted sample  additional confirmatory testing may be necessary for epidemiological  and / or clinical management purposes  to differentiate between  SARS-CoV-2 and other Sarbecovirus currently known to infect humans.  If clinically indicated additional testing with an alternate test  methodology [phone]) is advised. The SARS-CoV-2 RNA is generally  detectable in upper and lower respiratory sp ecimens during the acute  phase of infection. The expected result is Negative. Fact Sheet for Patients:  StrictlyIdeas.no Fact Sheet for  Healthcare Providers: BankingDealers.co.za This test is not yet approved or cleared by the [place] and has been authorized for detection and/or diagnosis of SARS-CoV-2 by FDA under an Emergency Use Authorization (EUA).  This EUA will remain in effect (meaning this test can be used) for the duration of the COVID-19 declaration under Section 564(b)(1) of the Act, 21 U.S.C. section 360bbb-3(b)(1), unless the authorization is terminated or revoked sooner. Performed at [facility], [place] [address] [place], [place] [address]   Blood Culture (routine x 2)     Status: None   Collection Time: 08/26/2019  3:30 PM   Specimen: BLOOD RIGHT HAND  Result Value Ref Range Status   Specimen Description BLOOD RIGHT HAND  Final   Special Requests   Final    BOTTLES DRAWN AEROBIC AND ANAEROBIC Blood Culture results may not be optimal due to an inadequate volume of blood received in culture bottles   Culture   Final    NO GROWTH 5 DAYS Performed at [facility], [place] [address], [place], [place] [address]    Report Status 08/28/2019 FINAL  Final  Blood Culture (routine x 2)     Status: None   Collection Time: 09/15/2019  3:40 PM   Specimen: BLOOD LEFT HAND  Result Value Ref Range Status   Specimen Description BLOOD LEFT HAND  Final   Special Requests   Final     BOTTLES DRAWN AEROBIC AND ANAEROBIC Blood Culture results may not be optimal due to an inadequate volume of blood received in culture bottles   Culture   Final    NO GROWTH 5 DAYS Performed at [facility], [place] [address] [place], [place] [address]    Report Status 08/28/2019 FINAL  Final  Respiratory Panel by PCR     Status: None   Collection Time: 08/30/2019  7:58 PM   Specimen: Nasopharyngeal Swab; Respiratory  Result Value Ref Range Status   Adenovirus NOT DETECTED NOT DETECTED Final   Coronavirus 229E NOT DETECTED NOT DETECTED Final    Comment: (NOTE) The Coronavirus on the Respiratory Panel, DOES NOT test for the novel  Coronavirus (2019 nCoV)    Coronavirus HKU1 NOT DETECTED NOT DETECTED Final   Coronavirus NL63 NOT DETECTED NOT DETECTED Final   Coronavirus OC43 NOT DETECTED NOT DETECTED Final   Metapneumovirus NOT DETECTED NOT DETECTED Final   Rhinovirus / Enterovirus NOT DETECTED NOT DETECTED Final   Influenza A NOT DETECTED NOT DETECTED Final   Influenza B NOT DETECTED NOT DETECTED Final   Parainfluenza Virus 1 NOT DETECTED NOT DETECTED Final   Parainfluenza Virus 2 NOT DETECTED NOT DETECTED Final   Parainfluenza Virus 3 NOT DETECTED NOT DETECTED Final   Parainfluenza Virus 4 NOT DETECTED NOT DETECTED Final   Respiratory Syncytial Virus NOT DETECTED NOT DETECTED Final   Bordetella pertussis NOT DETECTED NOT DETECTED Final   Chlamydophila pneumoniae NOT DETECTED NOT DETECTED Final   Mycoplasma pneumoniae NOT DETECTED NOT DETECTED Final    Comment: Performed at [facility], [place]. [address], [place], [place] [address]  SARS Coronavirus 2 by RT PCR (hospital order, performed in [facility] hospital lab) Nasopharyngeal Nasopharyngeal Swab     Status: None   Collection Time: 08/21/2019  7:58 PM   Specimen: Nasopharyngeal Swab  Result Value Ref Range Status   SARS Coronavirus 2 NEGATIVE NEGATIVE Final    Comment: (NOTE) If result is NEGATIVE SARS-CoV-2 target  nucleic acids are NOT DETECTED.  The SARS-CoV-2 RNA is generally detectable in upper and lower  respiratory specimens during the acute phase of infection. The lowest  concentration of SARS-CoV-2 viral copies this assay can detect is 250  copies / mL. A negative result does not preclude SARS-CoV-2 infection  and should not be used as the sole basis for treatment or other  patient management decisions.  A negative result may occur with  improper specimen collection / handling, submission of specimen other  than nasopharyngeal swab, presence of viral mutation(s) within the  areas targeted by this assay, and inadequate number of viral copies  (<250 copies / mL). A negative result must be combined with clinical  observations, patient history, and epidemiological information. If result is POSITIVE SARS-CoV-2 target nucleic acids are DETECTED. The SARS-CoV-2 RNA is generally detectable in upper and lower  respiratory specimens dur ing the acute phase of infection.  Positive  results are indicative of active infection with SARS-CoV-2.  Clinical  correlation with patient history and other diagnostic information is  necessary to determine patient infection status.  Positive results do  not rule out bacterial infection or co-infection with other viruses. If result is PRESUMPTIVE POSTIVE SARS-CoV-2 nucleic acids MAY BE PRESENT.   A presumptive positive result was obtained on the submitted specimen  and confirmed on repeat testing.  While 2019 novel coronavirus  (SARS-CoV-2) nucleic acids may be present in the submitted sample  additional confirmatory testing may be necessary for epidemiological  and / or clinical management purposes  to differentiate between  SARS-CoV-2 and other Sarbecovirus currently known to infect humans.  If clinically indicated additional testing with an alternate test  methodology [phone]) is advised. The SARS-CoV-2 RNA is generally  detectable in upper and lower  respiratory sp ecimens during the acute  phase of infection. The expected result is Negative. Fact Sheet for Patients:  StrictlyIdeas.no Fact Sheet for Healthcare Providers: BankingDealers.co.za This test is not yet approved or cleared by the [place] FDA and has been authorized for detection and/or diagnosis of SARS-CoV-2 by FDA under an Emergency Use Authorization (EUA).  This EUA will remain in effect (meaning this test can be used) for the duration of the COVID-19 declaration under Section 564(b)(1) of the Act, 21 U.S.C. section 360bbb-3(b)(1), unless the authorization is terminated or revoked sooner. Performed at [facility], [place] [address] [place], [place] [address]   MRSA PCR Screening     Status: None   Collection Time: 08/27/19  6:54 PM   Specimen: Nasopharyngeal  Result Value Ref Range Status   MRSA by PCR NEGATIVE NEGATIVE Final    Comment:        The GeneXpert MRSA Assay (FDA approved for NASAL specimens only), is one component of a comprehensive MRSA colonization surveillance program. It is not intended to diagnose MRSA infection nor to guide or monitor treatment for MRSA infections. Performed at [facility], [place] [address], [place], [place] [address]     RADIOLOGY STUDIES/RESULTS: Dg Chest 2 View  Result Date: 08/28/2019 CLINICAL DATA:  Pt states he recently had a heart attack. Pt denies chest pain at time of imaging. Pt arrived to ED with chest pain, SOB, and A-fib x5 days ago. Hx of DM, asthma. EXAM: CHEST - 2 VIEW COMPARISON:  Chest radiograph 08/25/2019 FINDINGS: Stable cardiomediastinal contours. Persistent bilateral right-greater-than-left streaky parahilar opacities suspicious for infection or edema. No pneumothorax. Bilateral small/moderate pleural effusions. No acute finding in the visualized skeleton. IMPRESSION: Persistent bilateral right-greater-than-left parahilar opacities,  and  bilateral pleural effusions, suspicious for infection and/or edema. Electronically Signed   By: Audie Pinto M.D.   On: 08/28/2019 13:36   Dg Chest 2 View  Result Date: 08/25/2019 CLINICAL DATA:  Respiratory distress: likely multifactorial I.e. asthma exacerbation or ?CAP /aspiration or chemical pneumonitis. - COVID 19 neg x2. EXAM: CHEST - 2 VIEW COMPARISON:  Chest radiograph 08/29/2019 FINDINGS: Stable cardiomediastinal contours with normal heart size. There are bilateral right greater than left interstitial opacities similar to prior, possibly representing infection or edema. Small pleural effusions. No acute finding in the visualized skeleton. IMPRESSION: Bilateral right greater than left interstitial opacities and small effusions similar to prior could represent edema or infection. Electronically Signed   By: Audie Pinto M.D.   On: 08/25/2019 15:43   Dg Chest 2 View  Result Date: 08/29/2019 CLINICAL DATA:  Shortness of breath EXAM: CHEST - 2 VIEW COMPARISON:  04/02/2014 FINDINGS: Cardiomegaly. Bilateral interstitial pulmonary opacity, most conspicuous in the perihilar right lung. There may be trace pleural effusions. Disc degenerative disease of the thoracic spine. IMPRESSION: Cardiomegaly. Bilateral interstitial pulmonary opacity, most conspicuous in the perihilar right lung. There may be trace pleural effusions. Findings are consistent with edema or infection. Electronically Signed   By: Eddie Candle M.D.   On: 09/06/2019 14:50   Ct Chest Wo Contrast  Result Date: 08/26/2019 CLINICAL DATA:  Worsening shortness of breath starting 5 days ago. Dry cough. Evaluate for aspiration. History of asthma diabetes. EXAM: CT CHEST WITHOUT CONTRAST TECHNIQUE: Multidetector CT imaging of the chest was performed following the standard protocol without IV contrast. COMPARISON:  08/25/2019 chest radiograph.  No prior CT. FINDINGS: Mild motion degradation. Cardiovascular: Aortic and branch vessel  atherosclerosis. Tortuous thoracic aorta. Borderline cardiomegaly. Multivessel coronary artery atherosclerosis. Mediastinum/Nodes: No middle mediastinal adenopathy. Hilar regions poorly evaluated without intravenous contrast. Normal caliber of the esophagus. Lungs/Pleura: Moderate right and small to moderate left pleural effusion. Presumed secretions throughout the trachea. Patent airways, with compression of lower lobe regions. Bibasilar dependent consolidation. Minimal biapical septal thickening. Bilateral upper lobe peribronchovascular ground-glass and nodular airspace disease. Upper Abdomen: Cholecystectomy. Normal imaged portions of the spleen, stomach, adrenal glands. Mild renal cortical thinning bilaterally. Pancreatic atrophy. Musculoskeletal: No acute osseous abnormality. IMPRESSION: 1. Right larger than left pleural effusions. Adjacent dependent bibasilar opacities are favored to represent atelectasis. 2. Upper lung ground-glass and nodular airspace disease is favored to represent infection, including atypical etiologies. No posterior predominance within the upper lobes to strongly suggest aspiration. 3. Coronary artery atherosclerosis. Aortic Atherosclerosis (ICD10-I70.0). 4. Minimal septal thickening at the apices, which given pleural fluid, is suspicious for interstitial edema. 5. Mild motion degradation. Electronically Signed   By: Abigail Miyamoto M.D.   On: 08/26/2019 18:03   US Renal  Result Date: 08/26/2019 CLINICAL DATA:  Acute kidney injury. EXAM: RENAL / URINARY TRACT ULTRASOUND COMPLETE COMPARISON:  Abdominal ultrasound 03/28/2014 FINDINGS: Right Kidney: Renal measurements: 10.6 x 5.2 by 5.1 cm = volume: 148 mL. Diffuse mild cortical thinning. Echogenicity within normal limits. No mass or hydronephrosis visualized. Left Kidney: Renal measurements: 10.9 x 5.0 x 5.2 cm = volume: 147 mL. Diffuse cortical thinning. Poorly visualized due to shadowing bowel gas. No definite hydronephrosis or mass.  Bladder: Not visualized. Other: Incidentally noted bilateral pleural effusions. IMPRESSION: 1. Poor visualization of the left kidney due to shadowing bowel gas. Bilateral renal cortical thinning. No definite hydronephrosis or mass. 2.  Incidentally noted bilateral pleural effusions. Electronically Signed   By: Jac Canavan.D.  On: 08/26/2019 14:04   Dg Swallowing Func-speech Pathology  Result Date: 08/24/2019 Objective Swallowing Evaluation: Type of Study: MBS-Modified Barium Swallow Study  Patient Details Name: ELYSHA URREGO MRN: [id number] Date of Birth: [birth year] Today's Date: 08/24/2019 Time: SLP Start Time (ACUTE ONLY): 22 -SLP Stop Time (ACUTE ONLY): 1409 SLP Time Calculation (min) (ACUTE ONLY): 14 min Past Medical History: Past Medical History: Diagnosis Date  Asthma   Diabetes mellitus without complication ([place])   Hard of hearing   wears hearing aides  Macular degeneration   Plantar fasciitis  Past Surgical History: Past Surgical History: Procedure Laterality Date  CHOLECYSTECTOMY N/A 03/28/2014  Procedure: LAPAROSCOPIC CHOLECYSTECTOMY WITH INTRAOPERATIVE CHOLANGIOGRAM;  Surgeon: [name], MD;  Location: [facility];  Service: General;  Laterality: N/A;  EYE SURGERY    HEMORRHOID SURGERY   HPI: 78 yo admitted with SOB with PNA. PMHx: asthma, DM, macular degeneration  Subjective: alert, upright in chair with spouse at bedside Assessment / Plan / Recommendation CHL IP CLINICAL IMPRESSIONS 08/24/2019 Clinical Impression Pt presents with a mild to moderate pharyngeal dysphagia of unknown etiology. Deficits c/b reduced base of tongue retraction, decreased laryngeal elevation, reduced timing and efficiency of laryngeal vestibule closure, decreased glottic closure, and diminished sensation. This allowed for: - frequent intermittent silent penetration of thin liquids before the swallow, trace silent aspiration after the swallow - gross silent penetration and aspiration of thin liquids via  straw use before the swallow.  Pt with mild vallecular and pyriform sinus residuals that decreased with second swallows. Recommend nectar thick liquids and regular consistencies with medicines in puree and safe swallow precautions. Pt okay for water in between meals following oral care. Educated pt and spouse regarding recommendations and findings. SLP to follow up for further management.  SLP Visit Diagnosis Dysphagia, pharyngeal phase (R13.13) Attention and concentration deficit following -- Frontal lobe and executive function deficit following -- Impact on safety and function Moderate aspiration risk   CHL IP TREATMENT RECOMMENDATION 08/24/2019 Treatment Recommendations Therapy as outlined in treatment plan below   Prognosis 08/24/2019 Prognosis for Safe Diet Advancement Good Barriers to Reach Goals -- Barriers/Prognosis Comment -- CHL IP DIET RECOMMENDATION 08/24/2019 SLP Diet Recommendations Nectar thick liquid;Regular solids Liquid Administration via Cup;No straw Medication Administration Whole meds with puree Compensations Slow rate;Small sips/bites;Multiple dry swallows after each bite/sip;Clear throat intermittently Postural Changes Seated upright at 90 degrees;Remain semi-upright after after feeds/meals (Comment)   CHL IP OTHER RECOMMENDATIONS 08/24/2019 Recommended Consults -- Oral Care Recommendations Oral care BID Other Recommendations Order thickener from pharmacy   CHL IP FOLLOW UP RECOMMENDATIONS 08/24/2019 Follow up Recommendations Outpatient SLP   CHL IP FREQUENCY AND DURATION 08/24/2019 Speech Therapy Frequency (ACUTE ONLY) min 2x/week Treatment Duration 1 week      CHL IP ORAL PHASE 08/24/2019 Oral Phase WFL Oral - Pudding Teaspoon -- Oral - Pudding Cup -- Oral - Honey Teaspoon -- Oral - Honey Cup -- Oral - Nectar Teaspoon -- Oral - Nectar Cup -- Oral - Nectar Straw -- Oral - Thin Teaspoon -- Oral - Thin Cup -- Oral - Thin Straw -- Oral - Puree -- Oral - Mech Soft -- Oral - Regular -- Oral -  Multi-Consistency -- Oral - Pill -- Oral Phase - Comment --  CHL IP PHARYNGEAL PHASE 08/24/2019 Pharyngeal Phase Impaired Pharyngeal- Pudding Teaspoon -- Pharyngeal -- Pharyngeal- Pudding Cup -- Pharyngeal -- Pharyngeal- Honey Teaspoon -- Pharyngeal -- Pharyngeal- Honey Cup -- Pharyngeal -- Pharyngeal- Nectar Teaspoon -- Pharyngeal -- Pharyngeal- Nectar Cup Reduced tongue  base retraction;Reduced laryngeal elevation;Pharyngeal residue - valleculae;Pharyngeal residue - pyriform Pharyngeal -- Pharyngeal- Nectar Straw -- Pharyngeal -- Pharyngeal- Thin Teaspoon -- Pharyngeal Material does not enter airway Pharyngeal- Thin Cup Reduced airway/laryngeal closure;Reduced epiglottic inversion;Trace aspiration;Penetration/Aspiration before swallow;Penetration/Apiration after swallow;Reduced tongue base retraction;Reduced laryngeal elevation Pharyngeal Material enters airway, CONTACTS cords and not ejected out;Material enters airway, remains ABOVE vocal cords and not ejected out;Material enters airway, passes BELOW cords without attempt by patient to eject out (silent aspiration) Pharyngeal- Thin Straw Moderate aspiration;Reduced epiglottic inversion;Reduced airway/laryngeal closure;Reduced tongue base retraction;Reduced laryngeal elevation;Penetration/Aspiration before swallow Pharyngeal Material enters airway, passes BELOW cords without attempt by patient to eject out (silent aspiration) Pharyngeal- Puree Reduced laryngeal elevation;Reduced tongue base retraction;Pharyngeal residue - valleculae Pharyngeal -- Pharyngeal- Mechanical Soft -- Pharyngeal -- Pharyngeal- Regular Reduced tongue base retraction;Pharyngeal residue - valleculae Pharyngeal -- Pharyngeal- Multi-consistency -- Pharyngeal -- Pharyngeal- Pill Reduced tongue base retraction;Reduced laryngeal elevation Pharyngeal -- Pharyngeal Comment --  CHL IP CERVICAL ESOPHAGEAL PHASE 08/24/2019 Cervical Esophageal Phase WFL Pudding Teaspoon -- Pudding Cup -- Honey Teaspoon --  Honey Cup -- Nectar Teaspoon -- Nectar Cup -- Nectar Straw -- Thin Teaspoon -- Thin Cup -- Thin Straw -- Puree -- Mechanical Soft -- Regular -- Multi-consistency -- Pill -- Cervical Esophageal Comment -- Chelsea E Hartness MA, CCC-SLP Acute Rehabilitation Services 08/24/2019, 2:47 PM                LOS: 5 days   Oren Binet, MD  [facility]  If 7PM-7AM, please contact night-coverage  Please page via [email or web address]  Go to [email or web address] and use [facility]'s universal password to access. If you do not have the password, please contact the hospital operator.  Locate the [facility] provider you are looking for under [facility] and page to a number that you can be directly reached. If you still have difficulty reaching the provider, please page the [facility] (Director on Call) for the Hospitalists listed on amion for assistance.  08/29/2019, 1:56 PM

## 2019-08-29 NOTE — Progress Notes (Signed)
Daily Progress Note   Patient Name: Bryan Turner       Date: 08/29/2019 DOB: 1941-03-06  Age: 78 y.o. MRN#: [id number] Attending Physician: Jonetta Osgood, MD Primary Care Physician: Lawerance Cruel, MD Admit Date: 08/30/2019  Reason for Consultation/Follow-up: Establishing goals of care, Non pain symptom management, Pain control, Psychosocial/spiritual support and Terminal Care  Subjective: Patient is somewhat more awake today. Appears comfortable, resting but easily aroused. Complains of mild generalized pain, more when he is repositioned. Denies shortness of breath. Continues to have a poor appetite. Wife, daughter, and son at the bedside. He has not eaten in the past several days but has requested water intermittently when awake. Family reports he is sleeping most of the time with periods when he awakens and engages in family discussions and expressing his love for them. He remains tearful that his grandchildren are unable to visit although they have video chatted. He states "it is just not the same when you know you are dying!" Support given.   Bryan Turner reports he is ready to go to heaven. He states "I have told them not to bury me with a tie and to make sure my diagnosis on my death certificate is not COVID because I heard that hospitals get reimbursed more for this! I am leaving here soon because God has already told me, it won't be long!" Family at the bedside and tearful. Emotional support provided. Family expressed appreciation of Chaplain support on yesterday.   He drifts off to sleep, during discussion. Wife states he was asking about going home possibly earlier. We discussed the option of getting him home with outpatient hospice however, family is unable to provide care due to  physical and emotional limitations. We discussed transfer to [facility], which family is familiar with from previous experience. They are requesting to talk it over and we will plan to discuss tomorrow. I educated family that patient is stable at this time and symptoms are effectively managed. If no further changes or major decline overnight he is appropriate for [facility] consideration. Family verbalized understanding.   Length of Stay: 5  Current Medications: Scheduled Meds:  . pantoprazole  40 mg Oral Daily    Continuous Infusions:   PRN Meds: acetaminophen, albuterol, antiseptic oral rinse, fentaNYL (SUBLIMAZE) injection, furosemide, glycopyrrolate, LORazepam, mometasone-formoterol,  ondansetron (ZOFRAN) IV, polyvinyl alcohol, Resource ThickenUp Clear, traMADol  Physical Exam       -  Awake, alert, NAD, thin, frail -diminished bilaterally -Irregularly irregular -weakness, mood appropriate  Vital Signs: BP 115/82 (BP Location: Left Arm)   Pulse 95   Temp 97.6 F (36.4 C) (Oral)   Resp (!) 22   Ht 6' (1.829 m)   Wt 96.4 kg   SpO2 99%   BMI 28.82 kg/m  SpO2: SpO2: 99 % O2 Device: O2 Device: Nasal Cannula O2 Flow Rate: O2 Flow Rate (L/min): 2 L/min  Intake/output summary:   Intake/Output Summary (Last 24 hours) at 08/29/2019 1135 Last data filed at 08/29/2019 0654 Gross per 24 hour  Intake 240 ml  Output 525 ml  Net -285 ml   LBM: Last BM Date: 08/26/19 Baseline Weight: Weight: 95.6 kg Most recent weight: Weight: 96.4 kg       Palliative Assessment/Data: Comfort       Patient Active Problem List   Diagnosis Date Noted  . Non-ST elevation (NSTEMI) myocardial infarction ([place])   . Congestive heart failure with left ventricular systolic dysfunction ([place])   . Diabetes mellitus type 2 in nonobese ([facility])   . CAP (community acquired pneumonia) 08/24/2019  . Acute kidney injury ([place]) 08/24/2019  . Respiratory distress 08/24/2019  . Hard of hearing   .  Diabetes mellitus without complication ([place])   . Pneumonia 08/18/2019  . Leukocytosis 09/11/2019  . Type 2 diabetes mellitus without complication ([place]) [id number]  . Asthma, chronic, unspecified asthma severity, with acute exacerbation 08/29/2019  . Cholecystitis 03/28/2014  . Cholelithiasis and acute cholecystitis without obstruction 03/28/2014    Palliative Care Assessment & Plan   Recommendations/Plan:  Continue full comfort care measures  Will follow up with family tomorrow for consideration of [facility] transfer given no major changes or decline over night.   Goals of Care and Additional Recommendations:  Limitations on Scope of Treatment: Full Comfort Care  Code Status:    Code Status Orders  (From admission, onward)         Start     Ordered   08/28/19 1242  Do not attempt resuscitation (DNR)  Continuous    Question Answer Comment  In the event of cardiac or respiratory ARREST Do not call a "code blue"   In the event of cardiac or respiratory ARREST Do not perform Intubation, CPR, defibrillation or ACLS   In the event of cardiac or respiratory ARREST Use medication by any route, position, wound care, and other measures to relive pain and suffering. May use oxygen, suction and manual treatment of airway obstruction as needed for comfort.      08/28/19 1249        Code Status History    Date Active Date Inactive Code Status Order ID Comments User Context   09/02/2019 1733 08/28/2019 1250 DNR [id number]  Aline August, MD ED   03/28/2014 1325 03/31/2014 1351 Full Code [id number]  Earnstine Regal, PA-C Inpatient   Advance Care Planning Activity      Prognosis:   < 2 weeks  Discharge Planning:  Anticipated Hospital Death vs. [facility] plan was discussed with patient, patient's family, and RN.   Thank you for allowing the Palliative Medicine Team to assist in the care of this patient.  Total Time: 35 min.   Greater than 50%  of this time was  spent counseling and coordinating care related to the above assessment and plan.  Lexine Baton  Pickenpack-Cousar, AGPCNP-BC Palliative Medicine Team  Phone: [phone] Pager: [phone] Amion: Bjorn Pippin    Please contact Palliative Medicine Team phone at [phone] for questions and concerns.

## 2019-08-30 NOTE — Plan of Care (Signed)
  Problem: Coping: Goal: Level of anxiety will decrease Outcome: Progressing   Problem: Pain Managment: Goal: General experience of comfort will improve Outcome: Progressing   Problem: Safety: Goal: Ability to remain free from injury will improve Outcome: Progressing   Problem: Skin Integrity: Goal: Risk for impaired skin integrity will decrease Outcome: Progressing   

## 2019-08-30 NOTE — Progress Notes (Signed)
PALLIATIVE NOTE:  Patient awake and alert on arrival. Staff attempting to assist with toileting for bowel movement. Patient noticeable unsteady and extremely weak. He complains of some upper body and back pain. RN administer PRN medications. He has also required ativan for anxiety. He continues to have poor appetite with only minimal sips of drink. He states he is not hungry and has no appetite.   Wife is at the bedside and states he is mainly sleeping and will wake up throughout the day for and engage in discussion only to drift back off to sleep. Patient states "I am sleepy and just waiting on my Lord to drift me off into my eternal rest!" Support provided as wife is tearful.   I discussed with wife given patient's symptoms are manageable with PRN medications and patient is not exhibiting signs of distress he would be stable for consideration of transfer to a residential hospice facility. Mrs. Fehrman shares they are familiar with [facility] and have worked with them in the past with other family members. She shared that she and her children was discussing earlier and they are all in agreement.   I educated Mrs.Borland on the referral process and transfer. She verbalized understanding and appreciation of continued support.   All questions answered and family has contact information if they have any further needs or questions.   -awake, A&O x3, ill-appearing -Irregularly irregular  -diminished bilaterally -weak, mood appropriate, somewhat somnolent but arousable   -CSW referral for [facility] with full comfort care measures -PMT will continue to support and follow  Total Time: 20 min.   Greater than 50%  of this time was spent counseling and coordinating care related to the above assessment and plan.  Alda Lea, AGPCNP-BC Palliative Medicine Team

## 2019-08-30 NOTE — Progress Notes (Signed)
PROGRESS NOTE    Bryan Turner  [id number] DOB: [birth year] DOA: 08/25/2019 PCP: Lawerance Cruel, MD   Brief Narrative:  Patient is a 78 y.o. male of asthma, DM-2, hard of hearing-who presented with progressively worsening shortness of breath-initially thought to have possible pneumonia.  However developed fluid overload-further evaluation revealed significantly non-STEMI and decompensated systolic heart failure.  Given poor functional status/debility and deconditioning-after extensive discussion with palliative care team-and prior physicians-he was transitioned to full comfort measures on 10/13.  See below for further details.     Assessment & Plan:   Principal Problem:   Respiratory distress Active Problems:   Pneumonia   Leukocytosis   Type 2 diabetes mellitus without complication (HCC)   Asthma, chronic, unspecified asthma severity, with acute exacerbation   CAP (community acquired pneumonia)   Hard of hearing   Diabetes mellitus without complication ([place])   Acute kidney injury ([place])   Non-ST elevation (NSTEMI) myocardial infarction ([place])   Congestive heart failure with left ventricular systolic dysfunction (HCC)   Diabetes mellitus type 2 in nonobese ([place])   Acute hypoxic respiratory failure secondary to aspiration pneumonia and decompensated systolic heart failure (EF 30-35% on 08/27/2019 by TTE):  No changes, continue IV Lasix for comfort-no longer on any antimicrobial therapy.  Non-STEMI: Seen by cardiology-given frailty/poor functional status-prognosis felt to be poor.  Briefly placed on IV heparin/aspirin/statin-but after discussion with the palliative care team-has been transitioned to full comfort measures.  PAF: Rate controlled-since on comfort measures-no role for long-term anticoagulation.  AKI on CKD 3: Likely hemodynamically mediated-no further work-up needed as comfort measures in effect.  DM-2: Since on comfort measures-no longer getting SSI CBG  checks.  No role to resume metformin on discharge.  Asthma: Appears stable-continue with inhalers for comfort.  Palliative care: See above discussion-seen by palliative care team on 10/14-transition to comfort care.  Will continue to await further recommendations from palliative care team for disposition.   DVT prophylaxis: SCD/Compression stockings  Code Status: DNR    Code Status Orders  (From admission, onward)         Start     Ordered   08/28/19 1242  Do not attempt resuscitation (DNR)  Continuous    Question Answer Comment  In the event of cardiac or respiratory ARREST Do not call a code blue   In the event of cardiac or respiratory ARREST Do not perform Intubation, CPR, defibrillation or ACLS   In the event of cardiac or respiratory ARREST Use medication by any route, position, wound care, and other measures to relive pain and suffering. May use oxygen, suction and manual treatment of airway obstruction as needed for comfort.      08/28/19 1249        Code Status History    Date Active Date Inactive Code Status Order ID Comments User Context   09/08/2019 1733 08/28/2019 1250 DNR [id number]  Aline August, MD ED   03/28/2014 1325 03/31/2014 1351 Full Code [id number]  Earnstine Regal, PA-C Inpatient   Advance Care Planning Activity     Family Communication: WIFE AT BEDSIDE  Disposition Plan:   Transition to hospice house when bed available Consults called: None Admission status: Inpatient   Consultants:   Cardiology, palliative care  Procedures:  Dg Chest 2 View  Result Date: 08/28/2019 CLINICAL DATA:  Pt states he recently had a heart attack. Pt denies chest pain at time of imaging. Pt arrived to ED with chest pain, SOB, and A-fib x5 days ago.  Hx of DM, asthma. EXAM: CHEST - 2 VIEW COMPARISON:  Chest radiograph 08/25/2019 FINDINGS: Stable cardiomediastinal contours. Persistent bilateral right-greater-than-left streaky parahilar opacities suspicious for  infection or edema. No pneumothorax. Bilateral small/moderate pleural effusions. No acute finding in the visualized skeleton. IMPRESSION: Persistent bilateral right-greater-than-left parahilar opacities, and bilateral pleural effusions, suspicious for infection and/or edema. Electronically Signed   By: Audie Pinto M.D.   On: 08/28/2019 13:36   Dg Chest 2 View  Result Date: 08/25/2019 CLINICAL DATA:  Respiratory distress: likely multifactorial I.e. asthma exacerbation or ?CAP /aspiration or chemical pneumonitis. - COVID 19 neg x2. EXAM: CHEST - 2 VIEW COMPARISON:  Chest radiograph 08/21/2019 FINDINGS: Stable cardiomediastinal contours with normal heart size. There are bilateral right greater than left interstitial opacities similar to prior, possibly representing infection or edema. Small pleural effusions. No acute finding in the visualized skeleton. IMPRESSION: Bilateral right greater than left interstitial opacities and small effusions similar to prior could represent edema or infection. Electronically Signed   By: Audie Pinto M.D.   On: 08/25/2019 15:43   Dg Chest 2 View  Result Date: 09/05/2019 CLINICAL DATA:  Shortness of breath EXAM: CHEST - 2 VIEW COMPARISON:  04/02/2014 FINDINGS: Cardiomegaly. Bilateral interstitial pulmonary opacity, most conspicuous in the perihilar right lung. There may be trace pleural effusions. Disc degenerative disease of the thoracic spine. IMPRESSION: Cardiomegaly. Bilateral interstitial pulmonary opacity, most conspicuous in the perihilar right lung. There may be trace pleural effusions. Findings are consistent with edema or infection. Electronically Signed   By: Eddie Candle M.D.   On: 08/16/2019 14:50   Ct Chest Wo Contrast  Result Date: 08/26/2019 CLINICAL DATA:  Worsening shortness of breath starting 5 days ago. Dry cough. Evaluate for aspiration. History of asthma diabetes. EXAM: CT CHEST WITHOUT CONTRAST TECHNIQUE: Multidetector CT imaging of the  chest was performed following the standard protocol without IV contrast. COMPARISON:  08/25/2019 chest radiograph.  No prior CT. FINDINGS: Mild motion degradation. Cardiovascular: Aortic and branch vessel atherosclerosis. Tortuous thoracic aorta. Borderline cardiomegaly. Multivessel coronary artery atherosclerosis. Mediastinum/Nodes: No middle mediastinal adenopathy. Hilar regions poorly evaluated without intravenous contrast. Normal caliber of the esophagus. Lungs/Pleura: Moderate right and small to moderate left pleural effusion. Presumed secretions throughout the trachea. Patent airways, with compression of lower lobe regions. Bibasilar dependent consolidation. Minimal biapical septal thickening. Bilateral upper lobe peribronchovascular ground-glass and nodular airspace disease. Upper Abdomen: Cholecystectomy. Normal imaged portions of the spleen, stomach, adrenal glands. Mild renal cortical thinning bilaterally. Pancreatic atrophy. Musculoskeletal: No acute osseous abnormality. IMPRESSION: 1. Right larger than left pleural effusions. Adjacent dependent bibasilar opacities are favored to represent atelectasis. 2. Upper lung ground-glass and nodular airspace disease is favored to represent infection, including atypical etiologies. No posterior predominance within the upper lobes to strongly suggest aspiration. 3. Coronary artery atherosclerosis. Aortic Atherosclerosis (ICD10-I70.0). 4. Minimal septal thickening at the apices, which given pleural fluid, is suspicious for interstitial edema. 5. Mild motion degradation. Electronically Signed   By: Abigail Miyamoto M.D.   On: 08/26/2019 18:03   US Renal  Result Date: 08/26/2019 CLINICAL DATA:  Acute kidney injury. EXAM: RENAL / URINARY TRACT ULTRASOUND COMPLETE COMPARISON:  Abdominal ultrasound 03/28/2014 FINDINGS: Right Kidney: Renal measurements: 10.6 x 5.2 by 5.1 cm = volume: 148 mL. Diffuse mild cortical thinning. Echogenicity within normal limits. No mass or  hydronephrosis visualized. Left Kidney: Renal measurements: 10.9 x 5.0 x 5.2 cm = volume: 147 mL. Diffuse cortical thinning. Poorly visualized due to shadowing bowel gas. No definite hydronephrosis or mass.  Bladder: Not visualized. Other: Incidentally noted bilateral pleural effusions. IMPRESSION: 1. Poor visualization of the left kidney due to shadowing bowel gas. Bilateral renal cortical thinning. No definite hydronephrosis or mass. 2.  Incidentally noted bilateral pleural effusions. Electronically Signed   By: Audie Pinto M.D.   On: 08/26/2019 14:04   Dg Swallowing Func-speech Pathology  Result Date: 08/24/2019 Objective Swallowing Evaluation: Type of Study: MBS-Modified Barium Swallow Study  Patient Details Name: GEORG FONS MRN: [id number] Date of Birth: 21-Jun-1941 Today's Date: 08/24/2019 Time: SLP Start Time (ACUTE ONLY): 48 -SLP Stop Time (ACUTE ONLY): 1409 SLP Time Calculation (min) (ACUTE ONLY): 14 min Past Medical History: Past Medical History: Diagnosis Date  Asthma   Diabetes mellitus without complication ([place])   Hard of hearing   wears hearing aides  Macular degeneration   Plantar fasciitis  Past Surgical History: Past Surgical History: Procedure Laterality Date  CHOLECYSTECTOMY N/A 03/28/2014  Procedure: LAPAROSCOPIC CHOLECYSTECTOMY WITH INTRAOPERATIVE CHOLANGIOGRAM;  Surgeon: [name], MD;  Location: [facility];  Service: General;  Laterality: N/A;  EYE SURGERY    HEMORRHOID SURGERY   HPI: 78 yo admitted with SOB with PNA. PMHx: asthma, DM, macular degeneration  Subjective: alert, upright in chair with spouse at bedside Assessment / Plan / Recommendation CHL IP CLINICAL IMPRESSIONS 08/24/2019 Clinical Impression Pt presents with a mild to moderate pharyngeal dysphagia of unknown etiology. Deficits c/b reduced base of tongue retraction, decreased laryngeal elevation, reduced timing and efficiency of laryngeal vestibule closure, decreased glottic closure, and diminished  sensation. This allowed for: - frequent intermittent silent penetration of thin liquids before the swallow, trace silent aspiration after the swallow - gross silent penetration and aspiration of thin liquids via straw use before the swallow.  Pt with mild vallecular and pyriform sinus residuals that decreased with second swallows. Recommend nectar thick liquids and regular consistencies with medicines in puree and safe swallow precautions. Pt okay for water in between meals following oral care. Educated pt and spouse regarding recommendations and findings. SLP to follow up for further management.  SLP Visit Diagnosis Dysphagia, pharyngeal phase (R13.13) Attention and concentration deficit following -- Frontal lobe and executive function deficit following -- Impact on safety and function Moderate aspiration risk   CHL IP TREATMENT RECOMMENDATION 08/24/2019 Treatment Recommendations Therapy as outlined in treatment plan below   Prognosis 08/24/2019 Prognosis for Safe Diet Advancement Good Barriers to Reach Goals -- Barriers/Prognosis Comment -- CHL IP DIET RECOMMENDATION 08/24/2019 SLP Diet Recommendations Nectar thick liquid;Regular solids Liquid Administration via Cup;No straw Medication Administration Whole meds with puree Compensations Slow rate;Small sips/bites;Multiple dry swallows after each bite/sip;Clear throat intermittently Postural Changes Seated upright at 90 degrees;Remain semi-upright after after feeds/meals (Comment)   CHL IP OTHER RECOMMENDATIONS 08/24/2019 Recommended Consults -- Oral Care Recommendations Oral care BID Other Recommendations Order thickener from pharmacy   CHL IP FOLLOW UP RECOMMENDATIONS 08/24/2019 Follow up Recommendations Outpatient SLP   CHL IP FREQUENCY AND DURATION 08/24/2019 Speech Therapy Frequency (ACUTE ONLY) min 2x/week Treatment Duration 1 week      CHL IP ORAL PHASE 08/24/2019 Oral Phase WFL Oral - Pudding Teaspoon -- Oral - Pudding Cup -- Oral - Honey Teaspoon -- Oral - Honey  Cup -- Oral - Nectar Teaspoon -- Oral - Nectar Cup -- Oral - Nectar Straw -- Oral - Thin Teaspoon -- Oral - Thin Cup -- Oral - Thin Straw -- Oral - Puree -- Oral - Mech Soft -- Oral - Regular -- Oral - Multi-Consistency -- Oral - Pill --  Oral Phase - Comment --  CHL IP PHARYNGEAL PHASE 08/24/2019 Pharyngeal Phase Impaired Pharyngeal- Pudding Teaspoon -- Pharyngeal -- Pharyngeal- Pudding Cup -- Pharyngeal -- Pharyngeal- Honey Teaspoon -- Pharyngeal -- Pharyngeal- Honey Cup -- Pharyngeal -- Pharyngeal- Nectar Teaspoon -- Pharyngeal -- Pharyngeal- Nectar Cup Reduced tongue base retraction;Reduced laryngeal elevation;Pharyngeal residue - valleculae;Pharyngeal residue - pyriform Pharyngeal -- Pharyngeal- Nectar Straw -- Pharyngeal -- Pharyngeal- Thin Teaspoon -- Pharyngeal Material does not enter airway Pharyngeal- Thin Cup Reduced airway/laryngeal closure;Reduced epiglottic inversion;Trace aspiration;Penetration/Aspiration before swallow;Penetration/Apiration after swallow;Reduced tongue base retraction;Reduced laryngeal elevation Pharyngeal Material enters airway, CONTACTS cords and not ejected out;Material enters airway, remains ABOVE vocal cords and not ejected out;Material enters airway, passes BELOW cords without attempt by patient to eject out (silent aspiration) Pharyngeal- Thin Straw Moderate aspiration;Reduced epiglottic inversion;Reduced airway/laryngeal closure;Reduced tongue base retraction;Reduced laryngeal elevation;Penetration/Aspiration before swallow Pharyngeal Material enters airway, passes BELOW cords without attempt by patient to eject out (silent aspiration) Pharyngeal- Puree Reduced laryngeal elevation;Reduced tongue base retraction;Pharyngeal residue - valleculae Pharyngeal -- Pharyngeal- Mechanical Soft -- Pharyngeal -- Pharyngeal- Regular Reduced tongue base retraction;Pharyngeal residue - valleculae Pharyngeal -- Pharyngeal- Multi-consistency -- Pharyngeal -- Pharyngeal- Pill Reduced tongue base  retraction;Reduced laryngeal elevation Pharyngeal -- Pharyngeal Comment --  CHL IP CERVICAL ESOPHAGEAL PHASE 08/24/2019 Cervical Esophageal Phase WFL Pudding Teaspoon -- Pudding Cup -- Honey Teaspoon -- Honey Cup -- Nectar Teaspoon -- Nectar Cup -- Nectar Straw -- Thin Teaspoon -- Thin Cup -- Thin Straw -- Puree -- Mechanical Soft -- Regular -- Multi-consistency -- Pill -- Cervical Esophageal Comment -- Chelsea E Hartness MA, CCC-SLP Acute Rehabilitation Services 08/24/2019, 2:47 PM                Antimicrobials:   None   Subjective: No acute events overnight Discussed plan of care with wife and patient at bedside  Objective: Vitals:   08/28/19 0830 08/28/19 2025 08/29/19 0652 08/30/19 0516  BP:  107/77 115/82 103/71  Pulse:  61 95 (!) 54  Resp:      Temp:  (!) 97.5 F (36.4 C) 97.6 F (36.4 C) 98.4 F (36.9 C)  TempSrc:  Oral Oral Oral  SpO2: 98% 97% 99% 96%  Weight:      Height:        Intake/Output Summary (Last 24 hours) at 08/30/2019 1312 Last data filed at 08/30/2019 0709 Gross per 24 hour  Intake --  Output 0 ml  Net 0 ml   Filed Weights   08/24/19 0130 08/28/19 0520  Weight: 95.6 kg 96.4 kg    Examination:  General exam: Appears calm and comfortable  Respiratory system: Rales and rhonchi bilaterally Cardiovascular system: S1 & S2 heard, RRR. No JVD, murmurs, rubs, gallops or clicks. No pedal edema. Gastrointestinal system: Abdomen is nondistended, soft and nontender. No organomegaly or masses felt. Normal bowel sounds heard. Central nervous system: Alert and oriented. No focal neurological deficits. Extremities: WWP, No contractures Skin: No rashes, lesions or ulcers Psychiatry: Judgement and insight appear normal. Mood & affect flat    Data Reviewed: I have personally reviewed following labs and imaging studies  CBC: Recent Labs  Lab 08/27/2019 1400 08/24/19 0211 08/26/19 0252 08/27/19 0539 08/28/19 0353  WBC 13.6* 12.7* 29.0* 25.7* 20.9*   NEUTROABS 10.0*  --   --   --   --   HGB 10.5* 9.8* 10.7* 11.1* 11.6*  HCT 31.8* 29.8* 33.5* 34.7* 35.5*  MCV 97.5 96.1 97.1 96.9 95.9  PLT 152 164 284 243 [id number]   Basic Metabolic  Panel: Recent Labs  Lab 08/25/19 0451 08/26/19 0252 08/27/19 0539 08/27/19 1714 08/28/19 0353  NA 140 139 141 143 146*  K 4.7 5.3* 5.3* 5.2* 4.4  CL 107 108 111 113* 112*  CO2 18* 17* 16* 17* 18*  GLUCOSE 268* 255* 210* 229* 141*  BUN 54* 65* 67* 66* 63*  CREATININE 1.96* 2.34* 2.23* 2.04* 2.15*  CALCIUM 8.6* 8.5* 8.2* 8.1* 8.3*   GFR: Estimated Creatinine Clearance: 33.5 mL/min (A) (by C-G formula based on SCr of 2.15 mg/dL (H)). Liver Function Tests: Recent Labs  Lab 08/24/19 0211  AST 70*  ALT 33  ALKPHOS 49  BILITOT 1.0  PROT 5.9*  ALBUMIN 2.8*   No results for input(s): LIPASE, AMYLASE in the last 168 hours. No results for input(s): AMMONIA in the last 168 hours. Coagulation Profile: No results for input(s): INR, PROTIME in the last 168 hours. Cardiac Enzymes: No results for input(s): CKTOTAL, CKMB, CKMBINDEX, TROPONINI in the last 168 hours. BNP (last 3 results) No results for input(s): PROBNP in the last 8760 hours. HbA1C: No results for input(s): HGBA1C in the last 72 hours. CBG: Recent Labs  Lab 08/27/19 0639 08/27/19 1152 08/27/19 1650 08/27/19 2052 08/28/19 0745  GLUCAP 185* 279* 202* 169* 141*   Lipid Profile: No results for input(s): CHOL, HDL, LDLCALC, TRIG, CHOLHDL, LDLDIRECT in the last 72 hours. Thyroid Function Tests: No results for input(s): TSH, T4TOTAL, FREET4, T3FREE, THYROIDAB in the last 72 hours. Anemia Panel: No results for input(s): VITAMINB12, FOLATE, FERRITIN, TIBC, IRON, RETICCTPCT in the last 72 hours. Sepsis Labs: Recent Labs  Lab 08/26/2019 1459 08/28/2019 2030 08/27/19 1310 08/28/19 1131  PROCALCITON <0.10  --  0.22  --   LATICACIDVEN 1.4 1.4 4.0* 1.9    Recent Results (from the past 240 hour(s))  SARS Coronavirus 2 by RT PCR (hospital  order, performed in [facility] hospital lab) Nasopharyngeal Nasopharyngeal Swab     Status: None   Collection Time: 09/05/2019  2:59 PM   Specimen: Nasopharyngeal Swab  Result Value Ref Range Status   SARS Coronavirus 2 NEGATIVE NEGATIVE Final    Comment: (NOTE) If result is NEGATIVE SARS-CoV-2 target nucleic acids are NOT DETECTED. The SARS-CoV-2 RNA is generally detectable in upper and lower  respiratory specimens during the acute phase of infection. The lowest  concentration of SARS-CoV-2 viral copies this assay can detect is 250  copies / mL. A negative result does not preclude SARS-CoV-2 infection  and should not be used as the sole basis for treatment or other  patient management decisions.  A negative result may occur with  improper specimen collection / handling, submission of specimen other  than nasopharyngeal swab, presence of viral mutation(s) within the  areas targeted by this assay, and inadequate number of viral copies  (<250 copies / mL). A negative result must be combined with clinical  observations, patient history, and epidemiological information. If result is POSITIVE SARS-CoV-2 target nucleic acids are DETECTED. The SARS-CoV-2 RNA is generally detectable in upper and lower  respiratory specimens dur ing the acute phase of infection.  Positive  results are indicative of active infection with SARS-CoV-2.  Clinical  correlation with patient history and other diagnostic information is  necessary to determine patient infection status.  Positive results do  not rule out bacterial infection or co-infection with other viruses. If result is PRESUMPTIVE POSTIVE SARS-CoV-2 nucleic acids MAY BE PRESENT.   A presumptive positive result was obtained on the submitted specimen  and confirmed on  repeat testing.  While 2019 novel coronavirus  (SARS-CoV-2) nucleic acids may be present in the submitted sample  additional confirmatory testing may be necessary for epidemiological  and  / or clinical management purposes  to differentiate between  SARS-CoV-2 and other Sarbecovirus currently known to infect humans.  If clinically indicated additional testing with an alternate test  methodology [phone]) is advised. The SARS-CoV-2 RNA is generally  detectable in upper and lower respiratory sp ecimens during the acute  phase of infection. The expected result is Negative. Fact Sheet for Patients:  StrictlyIdeas.no Fact Sheet for Healthcare Providers: BankingDealers.co.za This test is not yet approved or cleared by the [place] FDA and has been authorized for detection and/or diagnosis of SARS-CoV-2 by FDA under an Emergency Use Authorization (EUA).  This EUA will remain in effect (meaning this test can be used) for the duration of the COVID-19 declaration under Section 564(b)(1) of the Act, 21 U.S.C. section 360bbb-3(b)(1), unless the authorization is terminated or revoked sooner. Performed at [facility], [place] [address] [place], [place] [address]   Blood Culture (routine x 2)     Status: None   Collection Time: 08/20/2019  3:30 PM   Specimen: BLOOD RIGHT HAND  Result Value Ref Range Status   Specimen Description BLOOD RIGHT HAND  Final   Special Requests   Final    BOTTLES DRAWN AEROBIC AND ANAEROBIC Blood Culture results may not be optimal due to an inadequate volume of blood received in culture bottles   Culture   Final    NO GROWTH 5 DAYS Performed at [facility], [place] [address], [place], [place] [address]    Report Status 08/28/2019 FINAL  Final  Blood Culture (routine x 2)     Status: None   Collection Time:   3:40 PM   Specimen: BLOOD LEFT HAND  Result Value Ref Range Status   Specimen Description BLOOD LEFT HAND  Final   Special Requests   Final    BOTTLES DRAWN AEROBIC AND ANAEROBIC Blood Culture results may not be optimal due to an inadequate volume of blood received in culture  bottles   Culture   Final    NO GROWTH 5 DAYS Performed at [facility], [place] [address] [place], [place] [address]    Report Status 08/28/2019 FINAL  Final  Respiratory Panel by PCR     Status: None   Collection Time: 09/07/2019  7:58 PM   Specimen: Nasopharyngeal Swab; Respiratory  Result Value Ref Range Status   Adenovirus NOT DETECTED NOT DETECTED Final   Coronavirus 229E NOT DETECTED NOT DETECTED Final    Comment: (NOTE) The Coronavirus on the Respiratory Panel, DOES NOT test for the novel  Coronavirus (2019 nCoV)    Coronavirus HKU1 NOT DETECTED NOT DETECTED Final   Coronavirus NL63 NOT DETECTED NOT DETECTED Final   Coronavirus OC43 NOT DETECTED NOT DETECTED Final   Metapneumovirus NOT DETECTED NOT DETECTED Final   Rhinovirus / Enterovirus NOT DETECTED NOT DETECTED Final   Influenza A NOT DETECTED NOT DETECTED Final   Influenza B NOT DETECTED NOT DETECTED Final   Parainfluenza Virus 1 NOT DETECTED NOT DETECTED Final   Parainfluenza Virus 2 NOT DETECTED NOT DETECTED Final   Parainfluenza Virus 3 NOT DETECTED NOT DETECTED Final   Parainfluenza Virus 4 NOT DETECTED NOT DETECTED Final   Respiratory Syncytial Virus NOT DETECTED NOT DETECTED Final   Bordetella pertussis NOT DETECTED NOT DETECTED Final   Chlamydophila pneumoniae  NOT DETECTED NOT DETECTED Final   Mycoplasma pneumoniae NOT DETECTED NOT DETECTED Final    Comment: Performed at [facility], [place] [address], [place], [place] [address]  SARS Coronavirus 2 by RT PCR (hospital order, performed in [facility] hospital lab) Nasopharyngeal Nasopharyngeal Swab     Status: None   Collection Time: 08/27/2019  7:58 PM   Specimen: Nasopharyngeal Swab  Result Value Ref Range Status   SARS Coronavirus 2 NEGATIVE NEGATIVE Final    Comment: (NOTE) If result is NEGATIVE SARS-CoV-2 target nucleic acids are NOT DETECTED. The SARS-CoV-2 RNA is generally detectable in upper and lower  respiratory specimens during the acute  phase of infection. The lowest  concentration of SARS-CoV-2 viral copies this assay can detect is 250  copies / mL. A negative result does not preclude SARS-CoV-2 infection  and should not be used as the sole basis for treatment or other  patient management decisions.  A negative result may occur with  improper specimen collection / handling, submission of specimen other  than nasopharyngeal swab, presence of viral mutation(s) within the  areas targeted by this assay, and inadequate number of viral copies  (<250 copies / mL). A negative result must be combined with clinical  observations, patient history, and epidemiological information. If result is POSITIVE SARS-CoV-2 target nucleic acids are DETECTED. The SARS-CoV-2 RNA is generally detectable in upper and lower  respiratory specimens dur ing the acute phase of infection.  Positive  results are indicative of active infection with SARS-CoV-2.  Clinical  correlation with patient history and other diagnostic information is  necessary to determine patient infection status.  Positive results do  not rule out bacterial infection or co-infection with other viruses. If result is PRESUMPTIVE POSTIVE SARS-CoV-2 nucleic acids MAY BE PRESENT.   A presumptive positive result was obtained on the submitted specimen  and confirmed on repeat testing.  While 2019 novel coronavirus  (SARS-CoV-2) nucleic acids may be present in the submitted sample  additional confirmatory testing may be necessary for epidemiological  and / or clinical management purposes  to differentiate between  SARS-CoV-2 and other Sarbecovirus currently known to infect humans.  If clinically indicated additional testing with an alternate test  methodology [phone]) is advised. The SARS-CoV-2 RNA is generally  detectable in upper and lower respiratory sp ecimens during the acute  phase of infection. The expected result is Negative. Fact Sheet for Patients:   StrictlyIdeas.no Fact Sheet for Healthcare Providers: BankingDealers.co.za This test is not yet approved or cleared by the [place] FDA and has been authorized for detection and/or diagnosis of SARS-CoV-2 by FDA under an Emergency Use Authorization (EUA).  This EUA will remain in effect (meaning this test can be used) for the duration of the COVID-19 declaration under Section 564(b)(1) of the Act, 21 U.S.C. section 360bbb-3(b)(1), unless the authorization is terminated or revoked sooner. Performed at [facility], [place] [address] [place], [place] [address]   MRSA PCR Screening     Status: None   Collection Time: 08/27/19  6:54 PM   Specimen: Nasopharyngeal  Result Value Ref Range Status   MRSA by PCR NEGATIVE NEGATIVE Final    Comment:        The GeneXpert MRSA Assay (FDA approved for NASAL specimens only), is one component of a comprehensive MRSA colonization surveillance program. It is not intended to diagnose MRSA infection nor to guide or monitor treatment for MRSA infections. Performed at [facility], [place]  [address], [place], [place] [address]          Radiology Studies: No results found.      Scheduled Meds:  pantoprazole  40 mg Oral Daily   Continuous Infusions:   LOS: 6 days    Time spent: 25 min    Nicolette Bang, MD [facility]  If 7PM-7AM, please contact night-coverage  08/30/2019, 1:12 PM

## 2019-08-31 MED ORDER — FENTANYL CITRATE (PF) 100 MCG/2ML IJ SOLN
50.0000 ug | INTRAMUSCULAR | Status: DC | PRN
  Administered 2019-08-31 – 2019-09-01 (×6): 75 ug via INTRAVENOUS
  Administered 2019-09-01: 15:00:00 50 ug via INTRAVENOUS
  Administered 2019-09-01 – 2019-09-02 (×6): 75 ug via INTRAVENOUS
  Administered 2019-09-02: 03:00:00 50 ug via INTRAVENOUS
  Filled 2019-08-31 (×14): qty 2

## 2019-08-31 MED ORDER — FENTANYL CITRATE (PF) 100 MCG/2ML IJ SOLN
50.0000 ug | INTRAMUSCULAR | Status: DC | PRN
  Filled 2019-08-31: qty 2

## 2019-08-31 MED ORDER — LORAZEPAM 2 MG/ML IJ SOLN
0.5000 mg | INTRAMUSCULAR | Status: DC | PRN
  Administered 2019-08-31: 0.5 mg via INTRAVENOUS
  Administered 2019-09-01 – 2019-09-02 (×2): 1 mg via INTRAVENOUS
  Filled 2019-08-31 (×4): qty 1

## 2019-08-31 MED ORDER — DM-GUAIFENESIN ER 30-600 MG PO TB12
2.0000 | ORAL_TABLET | Freq: Two times a day (BID) | ORAL | Status: DC | PRN
  Administered 2019-08-31: 22:00:00 2 via ORAL
  Filled 2019-08-31: qty 2

## 2019-08-31 MED ORDER — MENTHOL 3 MG MT LOZG
1.0000 | LOZENGE | OROMUCOSAL | Status: DC | PRN
  Administered 2019-08-31: 21:00:00 3 mg via ORAL
  Filled 2019-08-31: qty 9

## 2019-08-31 NOTE — TOC Initial Note (Signed)
Transition of Care [facility]) - Initial/Assessment Note    Patient Details  Name: Bryan Turner MRN: [id number] Date of Birth: 1941/08/28  Transition [facility] CM/SW Contact:    Eileen Stanford, LCSW Phone Number: 08/31/2019, 10:20 AM  Clinical Narrative:     Per palliative pt's family is agreeable to [facility] at this time. Referral given to [facility]. [name] with [facility] will reach out to family.              Expected Discharge Plan: [place] Barriers to Discharge: Continued Medical Work up   Patient Goals and CMS Choice        Expected Discharge Plan and Services Expected Discharge Plan: [place] In-house Referral: Clinical Social Work Discharge Planning Services: NA     Expected Discharge Date: 08/25/19                                    Prior Living Arrangements/Services   Lives with:: Spouse Patient language and need for interpreter reviewed:: Yes Do you feel safe going back to the place where you live?: Yes      Need for Family Participation in Patient Care: Yes (Comment) Care giver support system in place?: Yes (comment)   Criminal Activity/Legal Involvement Pertinent to Current Situation/Hospitalization: No - Comment as needed  Activities of Daily Living Home Assistive Devices/Equipment: Cane (specify quad or straight)(straight) ADL Screening (condition at time of admission) Patient's cognitive ability adequate to safely complete daily activities?: Yes Is the patient deaf or have difficulty hearing?: Yes Does the patient have difficulty seeing, even when wearing glasses/contacts?: No Does the patient have difficulty concentrating, remembering, or making decisions?: No Patient able to express need for assistance with ADLs?: No Does the patient have difficulty dressing or bathing?: No Independently performs ADLs?: Yes (appropriate for developmental age) Does the patient have difficulty walking or climbing  stairs?: No Weakness of Legs: None Weakness of Arms/Hands: None  Permission Sought/Granted Permission sought to share information with : Family Supports, Customer service manager    Share Information with NAME: [name]  Permission granted to share info w AGENCY: International aid/development worker granted to share info w Relationship: spouse     Emotional Assessment       Orientation: : Oriented to Self, Oriented to Place, Oriented to  Time, Oriented to Situation Alcohol / Substance Use: Not Applicable Psych Involvement: No (comment)  Admission diagnosis:  CAP (community acquired pneumonia) [J18.9] Patient Active Problem List   Diagnosis Date Noted  . Non-ST elevation (NSTEMI) myocardial infarction ([place])   . Congestive heart failure with left ventricular systolic dysfunction ([place])   . Diabetes mellitus type 2 in nonobese (H[facility])   . CAP (community acquired pneumonia) 08/24/2019  . Acute kidney injury ([place]) 08/24/2019  . Respiratory distress 08/24/2019  . Hard of hearing   . Diabetes mellitus without complication ([place])   . Pneumonia 08/29/2019  . Leukocytosis 08/16/2019  . Type 2 diabetes mellitus without complication ([place]) [id number]  . Asthma, chronic, unspecified asthma severity, with acute exacerbation 09/01/2019  . Cholecystitis 03/28/2014  . Cholelithiasis and acute cholecystitis without obstruction 03/28/2014   PCP:  Lawerance Cruel, MD Pharmacy:   [facility], [place] - [address] [place] [address] [place] [address] [place] [address] [place]  [address] Phone: [phone] Fax: [phone]  Social Determinants of Health (SDOH) Interventions    Readmission Risk Interventions No flowsheet data found.

## 2019-08-31 NOTE — Progress Notes (Signed)
PALLIATIVE NOTE:  Patient in bed, sleeping but easily aroused to verbal stimuli. He complains of generalized pain and discomfort. Denies shortness of breath. Some wheezing noted. RN administered PRN lasix. Continues to receive PRN fentanyl for pain. Wife and daughter at the bedside.   Wife reports medication works but he seems to be having pain more frequently. Discussed dose adjustment as well as his listed allergy to codeine. She can not recall throat swelling in the past but states she does not wish to disregard. We discussed management of symptoms and changing frequency of medication and/or dosing. Wife and daughter verbalized agreement. Bryan Turner continues to have no appetite. He has not eaten more than a few bites over the past week and is only take sips of water throughout the day when he is awake.   Family verbalized speaking with Bryan Malta, RN with [facility] and their awareness there is not a bed available at this time at [facility].   Support provided and all questions answered.   -sleeping, easily aroused with verbal stimuli -RRR -wheezing  -A&O x3  Plan -Continue with comfort care measures with expectation of transferring to [facility] once bed becomes available.  -Fentanyl 50-58mcg PRN for pain/dyspnea -PMT will continue to support and follow. I will be off service tomorrow and return on Sunday.   Total Time: 20 min.   Greater than 50%  of this time was spent counseling and coordinating care related to the above assessment and plan.  Bryan Turner, AGPCNP-BC Palliative Medicine Team

## 2019-08-31 NOTE — Progress Notes (Signed)
Manufacturing engineer [facility])  Referral received from [place], [facility], for residential hospice at [facility].  Spoke with wife [name] to confirm interest and answer questions.  They are aware that [facility] does not have a bed to offer today.  TOC manager updated as well.  Venia Carbon RN, BSN, [facility] Liaison (in [place]) [phone]

## 2019-08-31 NOTE — Progress Notes (Signed)
Palliative Medicine RN Note: Rec'd a call from pt's wife that fentanyl is not holding his pain. Reviewed orders and MAR and spoke w RN; pt has not gotten any since this morning. She will bring 55mcg of fentanyl and a dose of prn lorazepam; plan for RN to call back if meds aren't holding him.  Marjie Skiff Wednesday Ericsson, RN, BSN, [facility] Palliative Medicine Team 08/31/2019 2:34 PM Office [phone]

## 2019-08-31 NOTE — Progress Notes (Signed)
PROGRESS NOTE    Bryan Turner  [id number] DOB: 03-11-1941 DOA: 09/04/2019 PCP: Lawerance Cruel, MD   Brief Narrative:  Patient is a78 y.o.maleof asthma, DM-2, hard of hearing-who presented with progressively worsening shortness of breath-initially thought to have possible pneumonia. However developed fluid overload-further evaluation revealed significantly non-STEMI and decompensated systolic heart failure. Given poor functional status/debility and deconditioning-after extensive discussion with palliative care team-and prior physicians-he was transitioned to full comfort measures on 10/13. See below for further details.     Assessment & Plan:   Principal Problem:   Respiratory distress Active Problems:   Pneumonia   Leukocytosis   Type 2 diabetes mellitus without complication (HCC)   Asthma, chronic, unspecified asthma severity, with acute exacerbation   CAP (community acquired pneumonia)   Hard of hearing   Diabetes mellitus without complication ([place])   Acute kidney injury ([place])   Non-ST elevation (NSTEMI) myocardial infarction ([place])   Congestive heart failure with left ventricular systolic dysfunction (HCC)   Diabetes mellitus type 2 in nonobese ([place])   Acute hypoxic respiratory failuresecondary to aspiration pneumonia and decompensated systolic heart failure (EF 30-35% on 08/27/2019 by TTE):Still no changes, continue IV Lasix for comfort-no longer on any antimicrobial therapy.  Non-STEMI:Seen by cardiology-given frailty/poor functional status-prognosis felt to be poor. Briefly placed on IV heparin/aspirin/statin-but after discussion with the palliative care team-has been transitioned to full comfort measures.  PAF: Rate controlled-since on comfort measures-no role for long-term anticoagulation.  AKI on CKD 3:Likely hemodynamically mediated-no further work-up needed as comfort measures in effect.  DM-2:Since on comfort measures-no longer getting SSI  CBG checks. No role to resume metformin on discharge.  Asthma: Appears stable-continue with inhalers for comfort.  Palliative care: See above discussion-seen by palliative care team on 10/14-transition to comfort care. Will continue to await further recommendations from palliative care team for disposition.  DVT prophylaxis: SCD/Compression stockings  Code Status: DNR    Code Status Orders  (From admission, onward)         Start     Ordered   08/28/19 1242  Do not attempt resuscitation (DNR)  Continuous    Question Answer Comment  In the event of cardiac or respiratory ARREST Do not call a code blue   In the event of cardiac or respiratory ARREST Do not perform Intubation, CPR, defibrillation or ACLS   In the event of cardiac or respiratory ARREST Use medication by any route, position, wound care, and other measures to relive pain and suffering. May use oxygen, suction and manual treatment of airway obstruction as needed for comfort.      08/28/19 1249        Code Status History    Date Active Date Inactive Code Status Order ID Comments User Context   09/07/2019 1733 08/28/2019 1250 DNR [id number]  Aline August, MD ED   03/28/2014 1325 03/31/2014 1351 Full Code [id number]  Earnstine Regal, PA-C Inpatient   Advance Care Planning Activity     Family Communication: Wife at bedside Disposition Plan:   Patient remained inpatient awaiting transition to hospice Consults called: None Admission status: Inpatient   Consultants:   Palliative care  Procedures:  Dg Chest 2 View  Result Date: 08/28/2019 CLINICAL DATA:  Pt states he recently had a heart attack. Pt denies chest pain at time of imaging. Pt arrived to ED with chest pain, SOB, and A-fib x5 days ago. Hx of DM, asthma. EXAM: CHEST - 2 VIEW COMPARISON:  Chest radiograph 08/25/2019 FINDINGS: Stable cardiomediastinal  contours. Persistent bilateral right-greater-than-left streaky parahilar opacities suspicious for  infection or edema. No pneumothorax. Bilateral small/moderate pleural effusions. No acute finding in the visualized skeleton. IMPRESSION: Persistent bilateral right-greater-than-left parahilar opacities, and bilateral pleural effusions, suspicious for infection and/or edema. Electronically Signed   By: Audie Pinto M.D.   On: 08/28/2019 13:36   Dg Chest 2 View  Result Date: 08/25/2019 CLINICAL DATA:  Respiratory distress: likely multifactorial I.e. asthma exacerbation or ?CAP /aspiration or chemical pneumonitis. - COVID 19 neg x2. EXAM: CHEST - 2 VIEW COMPARISON:  Chest radiograph 08/28/2019 FINDINGS: Stable cardiomediastinal contours with normal heart size. There are bilateral right greater than left interstitial opacities similar to prior, possibly representing infection or edema. Small pleural effusions. No acute finding in the visualized skeleton. IMPRESSION: Bilateral right greater than left interstitial opacities and small effusions similar to prior could represent edema or infection. Electronically Signed   By: Audie Pinto M.D.   On: 08/25/2019 15:43   Dg Chest 2 View  Result Date: 09/02/2019 CLINICAL DATA:  Shortness of breath EXAM: CHEST - 2 VIEW COMPARISON:  04/02/2014 FINDINGS: Cardiomegaly. Bilateral interstitial pulmonary opacity, most conspicuous in the perihilar right lung. There may be trace pleural effusions. Disc degenerative disease of the thoracic spine. IMPRESSION: Cardiomegaly. Bilateral interstitial pulmonary opacity, most conspicuous in the perihilar right lung. There may be trace pleural effusions. Findings are consistent with edema or infection. Electronically Signed   By: Eddie Candle M.D.   On: 09/06/2019 14:50   Ct Chest Wo Contrast  Result Date: 08/26/2019 CLINICAL DATA:  Worsening shortness of breath starting 5 days ago. Dry cough. Evaluate for aspiration. History of asthma diabetes. EXAM: CT CHEST WITHOUT CONTRAST TECHNIQUE: Multidetector CT imaging of the  chest was performed following the standard protocol without IV contrast. COMPARISON:  08/25/2019 chest radiograph.  No prior CT. FINDINGS: Mild motion degradation. Cardiovascular: Aortic and branch vessel atherosclerosis. Tortuous thoracic aorta. Borderline cardiomegaly. Multivessel coronary artery atherosclerosis. Mediastinum/Nodes: No middle mediastinal adenopathy. Hilar regions poorly evaluated without intravenous contrast. Normal caliber of the esophagus. Lungs/Pleura: Moderate right and small to moderate left pleural effusion. Presumed secretions throughout the trachea. Patent airways, with compression of lower lobe regions. Bibasilar dependent consolidation. Minimal biapical septal thickening. Bilateral upper lobe peribronchovascular ground-glass and nodular airspace disease. Upper Abdomen: Cholecystectomy. Normal imaged portions of the spleen, stomach, adrenal glands. Mild renal cortical thinning bilaterally. Pancreatic atrophy. Musculoskeletal: No acute osseous abnormality. IMPRESSION: 1. Right larger than left pleural effusions. Adjacent dependent bibasilar opacities are favored to represent atelectasis. 2. Upper lung ground-glass and nodular airspace disease is favored to represent infection, including atypical etiologies. No posterior predominance within the upper lobes to strongly suggest aspiration. 3. Coronary artery atherosclerosis. Aortic Atherosclerosis (ICD10-I70.0). 4. Minimal septal thickening at the apices, which given pleural fluid, is suspicious for interstitial edema. 5. Mild motion degradation. Electronically Signed   By: Abigail Miyamoto M.D.   On: 08/26/2019 18:03   US Renal  Result Date: 08/26/2019 CLINICAL DATA:  Acute kidney injury. EXAM: RENAL / URINARY TRACT ULTRASOUND COMPLETE COMPARISON:  Abdominal ultrasound 03/28/2014 FINDINGS: Right Kidney: Renal measurements: 10.6 x 5.2 by 5.1 cm = volume: 148 mL. Diffuse mild cortical thinning. Echogenicity within normal limits. No mass or  hydronephrosis visualized. Left Kidney: Renal measurements: 10.9 x 5.0 x 5.2 cm = volume: 147 mL. Diffuse cortical thinning. Poorly visualized due to shadowing bowel gas. No definite hydronephrosis or mass. Bladder: Not visualized. Other: Incidentally noted bilateral pleural effusions. IMPRESSION: 1. Poor visualization of the left kidney  due to shadowing bowel gas. Bilateral renal cortical thinning. No definite hydronephrosis or mass. 2.  Incidentally noted bilateral pleural effusions. Electronically Signed   By: Audie Pinto M.D.   On: 08/26/2019 14:04   Dg Swallowing Func-speech Pathology  Result Date: 08/24/2019 Objective Swallowing Evaluation: Type of Study: MBS-Modified Barium Swallow Study  Patient Details Name: KI AKERMAN MRN: [id number] Date of Birth: [birth year] Today's Date: 08/24/2019 Time: SLP Start Time (ACUTE ONLY): 45 -SLP Stop Time (ACUTE ONLY): 1409 SLP Time Calculation (min) (ACUTE ONLY): 14 min Past Medical History: Past Medical History: Diagnosis Date  Asthma   Diabetes mellitus without complication ([place])   Hard of hearing   wears hearing aides  Macular degeneration   Plantar fasciitis  Past Surgical History: Past Surgical History: Procedure Laterality Date  CHOLECYSTECTOMY N/A 03/28/2014  Procedure: LAPAROSCOPIC CHOLECYSTECTOMY WITH INTRAOPERATIVE CHOLANGIOGRAM;  Surgeon: [name], MD;  Location: [facility];  Service: General;  Laterality: N/A;  EYE SURGERY    HEMORRHOID SURGERY   HPI: 78 yo admitted with SOB with PNA. PMHx: asthma, DM, macular degeneration  Subjective: alert, upright in chair with spouse at bedside Assessment / Plan / Recommendation CHL IP CLINICAL IMPRESSIONS 08/24/2019 Clinical Impression Pt presents with a mild to moderate pharyngeal dysphagia of unknown etiology. Deficits c/b reduced base of tongue retraction, decreased laryngeal elevation, reduced timing and efficiency of laryngeal vestibule closure, decreased glottic closure, and diminished  sensation. This allowed for: - frequent intermittent silent penetration of thin liquids before the swallow, trace silent aspiration after the swallow - gross silent penetration and aspiration of thin liquids via straw use before the swallow.  Pt with mild vallecular and pyriform sinus residuals that decreased with second swallows. Recommend nectar thick liquids and regular consistencies with medicines in puree and safe swallow precautions. Pt okay for water in between meals following oral care. Educated pt and spouse regarding recommendations and findings. SLP to follow up for further management.  SLP Visit Diagnosis Dysphagia, pharyngeal phase (R13.13) Attention and concentration deficit following -- Frontal lobe and executive function deficit following -- Impact on safety and function Moderate aspiration risk   CHL IP TREATMENT RECOMMENDATION 08/24/2019 Treatment Recommendations Therapy as outlined in treatment plan below   Prognosis 08/24/2019 Prognosis for Safe Diet Advancement Good Barriers to Reach Goals -- Barriers/Prognosis Comment -- CHL IP DIET RECOMMENDATION 08/24/2019 SLP Diet Recommendations Nectar thick liquid;Regular solids Liquid Administration via Cup;No straw Medication Administration Whole meds with puree Compensations Slow rate;Small sips/bites;Multiple dry swallows after each bite/sip;Clear throat intermittently Postural Changes Seated upright at 90 degrees;Remain semi-upright after after feeds/meals (Comment)   CHL IP OTHER RECOMMENDATIONS 08/24/2019 Recommended Consults -- Oral Care Recommendations Oral care BID Other Recommendations Order thickener from pharmacy   CHL IP FOLLOW UP RECOMMENDATIONS 08/24/2019 Follow up Recommendations Outpatient SLP   CHL IP FREQUENCY AND DURATION 08/24/2019 Speech Therapy Frequency (ACUTE ONLY) min 2x/week Treatment Duration 1 week      CHL IP ORAL PHASE 08/24/2019 Oral Phase WFL Oral - Pudding Teaspoon -- Oral - Pudding Cup -- Oral - Honey Teaspoon -- Oral - Honey  Cup -- Oral - Nectar Teaspoon -- Oral - Nectar Cup -- Oral - Nectar Straw -- Oral - Thin Teaspoon -- Oral - Thin Cup -- Oral - Thin Straw -- Oral - Puree -- Oral - Mech Soft -- Oral - Regular -- Oral - Multi-Consistency -- Oral - Pill -- Oral Phase - Comment --  CHL IP PHARYNGEAL PHASE 08/24/2019 Pharyngeal Phase Impaired Pharyngeal- Pudding Teaspoon --  Pharyngeal -- Pharyngeal- Pudding Cup -- Pharyngeal -- Pharyngeal- Honey Teaspoon -- Pharyngeal -- Pharyngeal- Honey Cup -- Pharyngeal -- Pharyngeal- Nectar Teaspoon -- Pharyngeal -- Pharyngeal- Nectar Cup Reduced tongue base retraction;Reduced laryngeal elevation;Pharyngeal residue - valleculae;Pharyngeal residue - pyriform Pharyngeal -- Pharyngeal- Nectar Straw -- Pharyngeal -- Pharyngeal- Thin Teaspoon -- Pharyngeal Material does not enter airway Pharyngeal- Thin Cup Reduced airway/laryngeal closure;Reduced epiglottic inversion;Trace aspiration;Penetration/Aspiration before swallow;Penetration/Apiration after swallow;Reduced tongue base retraction;Reduced laryngeal elevation Pharyngeal Material enters airway, CONTACTS cords and not ejected out;Material enters airway, remains ABOVE vocal cords and not ejected out;Material enters airway, passes BELOW cords without attempt by patient to eject out (silent aspiration) Pharyngeal- Thin Straw Moderate aspiration;Reduced epiglottic inversion;Reduced airway/laryngeal closure;Reduced tongue base retraction;Reduced laryngeal elevation;Penetration/Aspiration before swallow Pharyngeal Material enters airway, passes BELOW cords without attempt by patient to eject out (silent aspiration) Pharyngeal- Puree Reduced laryngeal elevation;Reduced tongue base retraction;Pharyngeal residue - valleculae Pharyngeal -- Pharyngeal- Mechanical Soft -- Pharyngeal -- Pharyngeal- Regular Reduced tongue base retraction;Pharyngeal residue - valleculae Pharyngeal -- Pharyngeal- Multi-consistency -- Pharyngeal -- Pharyngeal- Pill Reduced tongue base  retraction;Reduced laryngeal elevation Pharyngeal -- Pharyngeal Comment --  CHL IP CERVICAL ESOPHAGEAL PHASE 08/24/2019 Cervical Esophageal Phase WFL Pudding Teaspoon -- Pudding Cup -- Honey Teaspoon -- Honey Cup -- Nectar Teaspoon -- Nectar Cup -- Nectar Straw -- Thin Teaspoon -- Thin Cup -- Thin Straw -- Puree -- Mechanical Soft -- Regular -- Multi-consistency -- Pill -- Cervical Esophageal Comment -- Chelsea E Hartness MA, CCC-SLP Acute [facility] Services 08/24/2019, 2:47 PM                Antimicrobials:   None   Subjective: Declining, sleeping a lot "ready to meet the Lord"  Objective: Vitals:   08/30/19 0516 08/30/19 1335 08/30/19 1952 08/31/19 0542  BP: 103/71 118/73 125/74 118/78  Pulse: (!) 54 94  84  Resp:  14    Temp: 98.4 F (36.9 C) 97.7 F (36.5 C)  98.1 F (36.7 C)  TempSrc: Oral Oral  Oral  SpO2: 96% 98%  95%  Weight:      Height:        Intake/Output Summary (Last 24 hours) at 08/31/2019 1313 Last data filed at 08/31/2019 0700 Gross per 24 hour  Intake 240 ml  Output 200 ml  Net 40 ml   Filed Weights   08/24/19 0130 08/28/19 0520  Weight: 95.6 kg 96.4 kg    Examination:  General exam: Appears calm and comfortable  Respiratory system: Clear to auscultation. Respiratory effort normal. Cardiovascular system: S1 & S2 heard, RRR. No JVD, murmurs, rubs, gallops or clicks. No pedal edema. Gastrointestinal system: Abdomen is nondistended, soft and nontender. No organomegaly or masses felt. Normal bowel sounds heard. Central nervous system: Alert and oriented. No focal neurological deficits. Extremities: Warm well perfused trace edema. Skin: No rashes, lesions or ulcers Psychiatry: Judgement and insight appear normal. Mood & affect flat.     Data Reviewed: I have personally reviewed following labs and imaging studies  CBC: Recent Labs  Lab 08/26/19 0252 08/27/19 0539 08/28/19 0353  WBC 29.0* 25.7* 20.9*  HGB 10.7* 11.1* 11.6*  HCT 33.5* 34.7*  35.5*  MCV 97.1 96.9 95.9  PLT 284 243 [id number]   Basic Metabolic Panel: Recent Labs  Lab 08/25/19 0451 08/26/19 0252 08/27/19 0539 08/27/19 1714 08/28/19 0353  NA 140 139 141 143 146*  K 4.7 5.3* 5.3* 5.2* 4.4  CL 107 108 111 113* 112*  CO2 18* 17* 16* 17* 18*  GLUCOSE 268* 255*  210* 229* 141*  BUN 54* 65* 67* 66* 63*  CREATININE 1.96* 2.34* 2.23* 2.04* 2.15*  CALCIUM 8.6* 8.5* 8.2* 8.1* 8.3*   GFR: Estimated Creatinine Clearance: 33.5 mL/min (A) (by C-G formula based on SCr of 2.15 mg/dL (H)). Liver Function Tests: No results for input(s): AST, ALT, ALKPHOS, BILITOT, PROT, ALBUMIN in the last 168 hours. No results for input(s): LIPASE, AMYLASE in the last 168 hours. No results for input(s): AMMONIA in the last 168 hours. Coagulation Profile: No results for input(s): INR, PROTIME in the last 168 hours. Cardiac Enzymes: No results for input(s): CKTOTAL, CKMB, CKMBINDEX, TROPONINI in the last 168 hours. BNP (last 3 results) No results for input(s): PROBNP in the last 8760 hours. HbA1C: No results for input(s): HGBA1C in the last 72 hours. CBG: Recent Labs  Lab 08/27/19 0639 08/27/19 1152 08/27/19 1650 08/27/19 2052 08/28/19 0745  GLUCAP 185* 279* 202* 169* 141*   Lipid Profile: No results for input(s): CHOL, HDL, LDLCALC, TRIG, CHOLHDL, LDLDIRECT in the last 72 hours. Thyroid Function Tests: No results for input(s): TSH, T4TOTAL, FREET4, T3FREE, THYROIDAB in the last 72 hours. Anemia Panel: No results for input(s): VITAMINB12, FOLATE, FERRITIN, TIBC, IRON, RETICCTPCT in the last 72 hours. Sepsis Labs: Recent Labs  Lab 08/27/19 1310 08/28/19 1131  PROCALCITON 0.22  --   LATICACIDVEN 4.0* 1.9    Recent Results (from the past 240 hour(s))  SARS Coronavirus 2 by RT PCR (hospital order, performed in [facility] hospital lab) Nasopharyngeal Nasopharyngeal Swab     Status: None   Collection Time: 08/28/2019  2:59 PM   Specimen: Nasopharyngeal Swab  Result Value Ref  Range Status   SARS Coronavirus 2 NEGATIVE NEGATIVE Final    Comment: (NOTE) If result is NEGATIVE SARS-CoV-2 target nucleic acids are NOT DETECTED. The SARS-CoV-2 RNA is generally detectable in upper and lower  respiratory specimens during the acute phase of infection. The lowest  concentration of SARS-CoV-2 viral copies this assay can detect is 250  copies / mL. A negative result does not preclude SARS-CoV-2 infection  and should not be used as the sole basis for treatment or other  patient management decisions.  A negative result may occur with  improper specimen collection / handling, submission of specimen other  than nasopharyngeal swab, presence of viral mutation(s) within the  areas targeted by this assay, and inadequate number of viral copies  (<250 copies / mL). A negative result must be combined with clinical  observations, patient history, and epidemiological information. If result is POSITIVE SARS-CoV-2 target nucleic acids are DETECTED. The SARS-CoV-2 RNA is generally detectable in upper and lower  respiratory specimens dur ing the acute phase of infection.  Positive  results are indicative of active infection with SARS-CoV-2.  Clinical  correlation with patient history and other diagnostic information is  necessary to determine patient infection status.  Positive results do  not rule out bacterial infection or co-infection with other viruses. If result is PRESUMPTIVE POSTIVE SARS-CoV-2 nucleic acids MAY BE PRESENT.   A presumptive positive result was obtained on the submitted specimen  and confirmed on repeat testing.  While 2019 novel coronavirus  (SARS-CoV-2) nucleic acids may be present in the submitted sample  additional confirmatory testing may be necessary for epidemiological  and / or clinical management purposes  to differentiate between  SARS-CoV-2 and other Sarbecovirus currently known to infect humans.  If clinically indicated additional testing with an  alternate test  methodology [phone]) is advised. The SARS-CoV-2 RNA is  generally  detectable in upper and lower respiratory sp ecimens during the acute  phase of infection. The expected result is Negative. Fact Sheet for Patients:  StrictlyIdeas.no Fact Sheet for Healthcare Providers: BankingDealers.co.za This test is not yet approved or cleared by the [place] FDA and has been authorized for detection and/or diagnosis of SARS-CoV-2 by FDA under an Emergency Use Authorization (EUA).  This EUA will remain in effect (meaning this test can be used) for the duration of the COVID-19 declaration under Section 564(b)(1) of the Act, 21 U.S.C. section 360bbb-3(b)(1), unless the authorization is terminated or revoked sooner. Performed at [facility], [place] [address] [place], [place] [address]   Blood Culture (routine x 2)     Status: None   Collection Time: 08/20/2019  3:30 PM   Specimen: BLOOD RIGHT HAND  Result Value Ref Range Status   Specimen Description BLOOD RIGHT HAND  Final   Special Requests   Final    BOTTLES DRAWN AEROBIC AND ANAEROBIC Blood Culture results may not be optimal due to an inadequate volume of blood received in culture bottles   Culture   Final    NO GROWTH 5 DAYS Performed at [facility], [place] [address], [place], [place] [address]    Report Status 08/28/2019 FINAL  Final  Blood Culture (routine x 2)     Status: None   Collection Time: 09/13/2019  3:40 PM   Specimen: BLOOD LEFT HAND  Result Value Ref Range Status   Specimen Description BLOOD LEFT HAND  Final   Special Requests   Final    BOTTLES DRAWN AEROBIC AND ANAEROBIC Blood Culture results may not be optimal due to an inadequate volume of blood received in culture bottles   Culture   Final    NO GROWTH 5 DAYS Performed at [facility], [place] [address], [place], [place] [address]    Report Status 08/28/2019 FINAL  Final  Respiratory Panel  by PCR     Status: None   Collection Time: 08/19/2019  7:58 PM   Specimen: Nasopharyngeal Swab; Respiratory  Result Value Ref Range Status   Adenovirus NOT DETECTED NOT DETECTED Final   Coronavirus 229E NOT DETECTED NOT DETECTED Final    Comment: (NOTE) The Coronavirus on the Respiratory Panel, DOES NOT test for the novel  Coronavirus (2019 nCoV)    Coronavirus HKU1 NOT DETECTED NOT DETECTED Final   Coronavirus NL63 NOT DETECTED NOT DETECTED Final   Coronavirus OC43 NOT DETECTED NOT DETECTED Final   Metapneumovirus NOT DETECTED NOT DETECTED Final   Rhinovirus / Enterovirus NOT DETECTED NOT DETECTED Final   Influenza A NOT DETECTED NOT DETECTED Final   Influenza B NOT DETECTED NOT DETECTED Final   Parainfluenza Virus 1 NOT DETECTED NOT DETECTED Final   Parainfluenza Virus 2 NOT DETECTED NOT DETECTED Final   Parainfluenza Virus 3 NOT DETECTED NOT DETECTED Final   Parainfluenza Virus 4 NOT DETECTED NOT DETECTED Final   Respiratory Syncytial Virus NOT DETECTED NOT DETECTED Final   Bordetella pertussis NOT DETECTED NOT DETECTED Final   Chlamydophila pneumoniae NOT DETECTED NOT DETECTED Final   Mycoplasma pneumoniae NOT DETECTED NOT DETECTED Final    Comment: Performed at [facility], [place]. [address], [place], [place] [address]  SARS Coronavirus 2 by RT PCR (hospital order, performed in [facility] hospital lab) Nasopharyngeal Nasopharyngeal Swab     Status: None   Collection Time: 09/12/2019  7:58 PM   Specimen: Nasopharyngeal Swab  Result Value Ref Range Status   SARS Coronavirus 2 NEGATIVE NEGATIVE Final    Comment: (NOTE) If result is NEGATIVE SARS-CoV-2 target nucleic acids are NOT DETECTED. The SARS-CoV-2 RNA is generally detectable in upper and lower  respiratory specimens during the acute phase of infection. The lowest  concentration of SARS-CoV-2 viral copies this assay can detect is 250  copies / mL. A negative result does not preclude SARS-CoV-2 infection  and should not  be used as the sole basis for treatment or other  patient management decisions.  A negative result may occur with  improper specimen collection / handling, submission of specimen other  than nasopharyngeal swab, presence of viral mutation(s) within the  areas targeted by this assay, and inadequate number of viral copies  (<250 copies / mL). A negative result must be combined with clinical  observations, patient history, and epidemiological information. If result is POSITIVE SARS-CoV-2 target nucleic acids are DETECTED. The SARS-CoV-2 RNA is generally detectable in upper and lower  respiratory specimens dur ing the acute phase of infection.  Positive  results are indicative of active infection with SARS-CoV-2.  Clinical  correlation with patient history and other diagnostic information is  necessary to determine patient infection status.  Positive results do  not rule out bacterial infection or co-infection with other viruses. If result is PRESUMPTIVE POSTIVE SARS-CoV-2 nucleic acids MAY BE PRESENT.   A presumptive positive result was obtained on the submitted specimen  and confirmed on repeat testing.  While 2019 novel coronavirus  (SARS-CoV-2) nucleic acids may be present in the submitted sample  additional confirmatory testing may be necessary for epidemiological  and / or clinical management purposes  to differentiate between  SARS-CoV-2 and other Sarbecovirus currently known to infect humans.  If clinically indicated additional testing with an alternate test  methodology [phone]) is advised. The SARS-CoV-2 RNA is generally  detectable in upper and lower respiratory sp ecimens during the acute  phase of infection. The expected result is Negative. Fact Sheet for Patients:  StrictlyIdeas.no Fact Sheet for Healthcare Providers: BankingDealers.co.za This test is not yet approved or cleared by the [place] FDA and has been authorized  for detection and/or diagnosis of SARS-CoV-2 by FDA under an Emergency Use Authorization (EUA).  This EUA will remain in effect (meaning this test can be used) for the duration of the COVID-19 declaration under Section 564(b)(1) of the Act, 21 U.S.C. section 360bbb-3(b)(1), unless the authorization is terminated or revoked sooner. Performed at [facility], [place] [address], [place], [place] [address]   MRSA PCR Screening     Status: None   Collection Time: 08/27/19  6:54 PM   Specimen: Nasopharyngeal  Result Value Ref Range Status   MRSA by PCR NEGATIVE NEGATIVE Final    Comment:        The GeneXpert MRSA Assay (FDA approved for NASAL specimens only), is one component of a comprehensive MRSA colonization surveillance program. It is not intended to diagnose MRSA infection nor to guide or monitor treatment for MRSA infections. Performed at [facility], [place] [address] [place], [place] [address]          Radiology Studies: No results found.      Scheduled Meds:  pantoprazole  40 mg Oral Daily   Continuous Infusions:   LOS: 7 days    Time spent: 25 min    Nicolette Bang, MD [facility]  If 7PM-7AM, please contact night-coverage  08/31/2019, 1:13 PM

## 2019-09-01 NOTE — Progress Notes (Signed)
[facility]  Continue to follow for family interest in [facility]. Unfortunately [facility] is not able to offer a room today. CSW [name] has been updated. Will contact family and make them aware. Will continue to follow and provide availability updates.   Thank you,  Erling Conte, LCSW [phone]

## 2019-09-01 NOTE — Progress Notes (Signed)
[facility]  PROGRESS NOTE    Bryan Turner  [id number] DOB: 1941/02/17 DOA: 08/22/2019 PCP: Lawerance Cruel, MD   Brief Narrative:   Patient is a23 y.o.maleof asthma, DM-2, hard of hearing-who presented with progressively worsening shortness of breath-initially thought to have possible pneumonia. However developed fluid overload-further evaluation revealed significantly non-STEMI and decompensated systolic heart failure. Given poor functional status/debility and deconditioning-after extensive discussion with palliative care team-and prior physicians-he was transitioned to full comfort measures on 10/13. See below for further details.  10/17: increased pain ON   Assessment & Plan:   Principal Problem:   Respiratory distress Active Problems:   Pneumonia   Leukocytosis   Type 2 diabetes mellitus without complication (HCC)   Asthma, chronic, unspecified asthma severity, with acute exacerbation   CAP (community acquired pneumonia)   Hard of hearing   Diabetes mellitus without complication ([place])   Acute kidney injury ([place])   Non-ST elevation (NSTEMI) myocardial infarction ([place])   Congestive heart failure with left ventricular systolic dysfunction ([place])   Diabetes mellitus type 2 in nonobese ([place])   Acute hypoxic respiratory failuresecondary to aspiration pneumonia and decompensated systolic heart failure (EF 30-35% on 08/27/2019 by TTE)     - IV Lasix for comfort     - no abx     - comfort measures  Non-STEMI:     - Seen by cardiology: given frailty/poor functional status, his prognosis felt to be poor.      - Briefly placed on IV heparin/aspirin/statin-but after discussion with the palliative care team-has been transitioned to full comfort measures.  PAF:      - Rate controlled     - no long term anticoag d/t comfort measures  AKI on CKD 3:     - no further workup/labs d/t comfort measures  DM-2:     - no further glucose checks, intervention d/t comfort  measures  Asthma:     - inhalers for comfort.  Palliative care:     - now comfort measures; awaiting bed opening at [facility].   DVT prophylaxis: None, comfort measures Code Status: DNR Family Communication: With wife at bedside   Disposition Plan: To [facility] when open  Consultants:   Palliative Care  ROS:  Reports dyspnea, chest discomfort . Remainder 10-pt ROS is negative for all not previously mentioned.  Subjective: "There's a lot of people out there to know."  Objective: Vitals:   08/31/19 2315 09/01/19 0557 09/01/19 0623 09/01/19 1348  BP: 121/85 120/85 96/83 118/75  Pulse:   88 90  Resp:      Temp:   97.9 F (36.6 C) 98 F (36.7 C)  TempSrc:   Oral Oral  SpO2:   97% 98%  Weight:      Height:        Intake/Output Summary (Last 24 hours) at 09/01/2019 1529 Last data filed at 09/01/2019 0600 Gross per 24 hour  Intake 120 ml  Output 600 ml  Net -480 ml   Filed Weights   08/24/19 0130 08/28/19 0520  Weight: 95.6 kg 96.4 kg    Examination:  General: 78 y.o. male resting in bed in NAD Cardiovascular: RRR, +S1, S2, no m/g/r, equal pulses throughout Respiratory: upper airway transition; exp wheeze GI: BS+, NDNT, no masses noted, no organomegaly noted MSK: No e/c/c Neuro: alert to name, follows commands Psyc: Appropriate interaction and affect, calm/cooperative   Data Reviewed: I have personally reviewed following labs and imaging studies.  CBC: Recent Labs  Lab 08/26/19 0252 08/27/19 0539 08/28/19 0353  WBC 29.0* 25.7* 20.9*  HGB 10.7* 11.1* 11.6*  HCT 33.5* 34.7* 35.5*  MCV 97.1 96.9 95.9  PLT 284 243 [id number]   Basic Metabolic Panel: Recent Labs  Lab 08/26/19 0252 08/27/19 0539 08/27/19 1714 08/28/19 0353  NA 139 141 143 146*  K 5.3* 5.3* 5.2* 4.4  CL 108 111 113* 112*  CO2 17* 16* 17* 18*  GLUCOSE 255* 210* 229* 141*  BUN 65* 67* 66* 63*  CREATININE 2.34* 2.23* 2.04* 2.15*  CALCIUM 8.5* 8.2* 8.1* 8.3*   GFR: Estimated  Creatinine Clearance: 33.5 mL/min (A) (by C-G formula based on SCr of 2.15 mg/dL (H)). Liver Function Tests: No results for input(s): AST, ALT, ALKPHOS, BILITOT, PROT, ALBUMIN in the last 168 hours. No results for input(s): LIPASE, AMYLASE in the last 168 hours. No results for input(s): AMMONIA in the last 168 hours. Coagulation Profile: No results for input(s): INR, PROTIME in the last 168 hours. Cardiac Enzymes: No results for input(s): CKTOTAL, CKMB, CKMBINDEX, TROPONINI in the last 168 hours. BNP (last 3 results) No results for input(s): PROBNP in the last 8760 hours. HbA1C: No results for input(s): HGBA1C in the last 72 hours. CBG: Recent Labs  Lab 08/27/19 0639 08/27/19 1152 08/27/19 1650 08/27/19 2052 08/28/19 0745  GLUCAP 185* 279* 202* 169* 141*   Lipid Profile: No results for input(s): CHOL, HDL, LDLCALC, TRIG, CHOLHDL, LDLDIRECT in the last 72 hours. Thyroid Function Tests: No results for input(s): TSH, T4TOTAL, FREET4, T3FREE, THYROIDAB in the last 72 hours. Anemia Panel: No results for input(s): VITAMINB12, FOLATE, FERRITIN, TIBC, IRON, RETICCTPCT in the last 72 hours. Sepsis Labs: Recent Labs  Lab 08/27/19 1310 08/28/19 1131  PROCALCITON 0.22  --   LATICACIDVEN 4.0* 1.9    Recent Results (from the past 240 hour(s))  SARS Coronavirus 2 by RT PCR (hospital order, performed in [facility] hospital lab) Nasopharyngeal Nasopharyngeal Swab     Status: None   Collection Time: 09/10/2019  2:59 PM   Specimen: Nasopharyngeal Swab  Result Value Ref Range Status   SARS Coronavirus 2 NEGATIVE NEGATIVE Final    Comment: (NOTE) If result is NEGATIVE SARS-CoV-2 target nucleic acids are NOT DETECTED. The SARS-CoV-2 RNA is generally detectable in upper and lower  respiratory specimens during the acute phase of infection. The lowest  concentration of SARS-CoV-2 viral copies this assay can detect is 250  copies / mL. A negative result does not preclude SARS-CoV-2 infection   and should not be used as the sole basis for treatment or other  patient management decisions.  A negative result may occur with  improper specimen collection / handling, submission of specimen other  than nasopharyngeal swab, presence of viral mutation(s) within the  areas targeted by this assay, and inadequate number of viral copies  (<250 copies / mL). A negative result must be combined with clinical  observations, patient history, and epidemiological information. If result is POSITIVE SARS-CoV-2 target nucleic acids are DETECTED. The SARS-CoV-2 RNA is generally detectable in upper and lower  respiratory specimens dur ing the acute phase of infection.  Positive  results are indicative of active infection with SARS-CoV-2.  Clinical  correlation with patient history and other diagnostic information is  necessary to determine patient infection status.  Positive results do  not rule out bacterial infection or co-infection with other viruses. If result is PRESUMPTIVE POSTIVE SARS-CoV-2 nucleic acids MAY BE PRESENT.   A presumptive positive result was obtained on the  submitted specimen  and confirmed on repeat testing.  While 2019 novel coronavirus  (SARS-CoV-2) nucleic acids may be present in the submitted sample  additional confirmatory testing may be necessary for epidemiological  and / or clinical management purposes  to differentiate between  SARS-CoV-2 and other Sarbecovirus currently known to infect humans.  If clinically indicated additional testing with an alternate test  methodology [phone]) is advised. The SARS-CoV-2 RNA is generally  detectable in upper and lower respiratory sp ecimens during the acute  phase of infection. The expected result is Negative. Fact Sheet for Patients:  StrictlyIdeas.no Fact Sheet for Healthcare Providers: BankingDealers.co.za This test is not yet approved or cleared by the [place] FDA and has  been authorized for detection and/or diagnosis of SARS-CoV-2 by FDA under an Emergency Use Authorization (EUA).  This EUA will remain in effect (meaning this test can be used) for the duration of the COVID-19 declaration under Section 564(b)(1) of the Act, 21 U.S.C. section 360bbb-3(b)(1), unless the authorization is terminated or revoked sooner. Performed at [facility], [place] [address] [place], [place] [address]   Blood Culture (routine x 2)     Status: None   Collection Time: 08/31/2019  3:30 PM   Specimen: BLOOD RIGHT HAND  Result Value Ref Range Status   Specimen Description BLOOD RIGHT HAND  Final   Special Requests   Final    BOTTLES DRAWN AEROBIC AND ANAEROBIC Blood Culture results may not be optimal due to an inadequate volume of blood received in culture bottles   Culture   Final    NO GROWTH 5 DAYS Performed at [facility], [place] [address], [place], [place] [address]    Report Status 08/28/2019 FINAL  Final  Blood Culture (routine x 2)     Status: None   Collection Time: 09/14/2019  3:40 PM   Specimen: BLOOD LEFT HAND  Result Value Ref Range Status   Specimen Description BLOOD LEFT HAND  Final   Special Requests   Final    BOTTLES DRAWN AEROBIC AND ANAEROBIC Blood Culture results may not be optimal due to an inadequate volume of blood received in culture bottles   Culture   Final    NO GROWTH 5 DAYS Performed at [facility], [place] [address] [place], [place] [address]    Report Status 08/28/2019 FINAL  Final  Respiratory Panel by PCR     Status: None   Collection Time: 09/05/2019  7:58 PM   Specimen: Nasopharyngeal Swab; Respiratory  Result Value Ref Range Status   Adenovirus NOT DETECTED NOT DETECTED Final   Coronavirus 229E NOT DETECTED NOT DETECTED Final    Comment: (NOTE) The Coronavirus on the Respiratory Panel, DOES NOT test for the novel  Coronavirus (2019 nCoV)    Coronavirus HKU1 NOT DETECTED NOT DETECTED Final   Coronavirus NL63 NOT  DETECTED NOT DETECTED Final   Coronavirus OC43 NOT DETECTED NOT DETECTED Final   Metapneumovirus NOT DETECTED NOT DETECTED Final   Rhinovirus / Enterovirus NOT DETECTED NOT DETECTED Final   Influenza A NOT DETECTED NOT DETECTED Final   Influenza B NOT DETECTED NOT DETECTED Final   Parainfluenza Virus 1 NOT DETECTED NOT DETECTED Final   Parainfluenza Virus 2 NOT DETECTED NOT DETECTED Final   Parainfluenza Virus 3 NOT DETECTED NOT DETECTED Final   Parainfluenza Virus 4 NOT DETECTED NOT DETECTED Final   Respiratory Syncytial Virus NOT DETECTED NOT DETECTED Final   Bordetella pertussis NOT DETECTED NOT  DETECTED Final   Chlamydophila pneumoniae NOT DETECTED NOT DETECTED Final   Mycoplasma pneumoniae NOT DETECTED NOT DETECTED Final    Comment: Performed at [facility], [place] [address], [place], [place] [address]  SARS Coronavirus 2 by RT PCR (hospital order, performed in [facility] hospital lab) Nasopharyngeal Nasopharyngeal Swab     Status: None   Collection Time: 08/20/2019  7:58 PM   Specimen: Nasopharyngeal Swab  Result Value Ref Range Status   SARS Coronavirus 2 NEGATIVE NEGATIVE Final    Comment: (NOTE) If result is NEGATIVE SARS-CoV-2 target nucleic acids are NOT DETECTED. The SARS-CoV-2 RNA is generally detectable in upper and lower  respiratory specimens during the acute phase of infection. The lowest  concentration of SARS-CoV-2 viral copies this assay can detect is 250  copies / mL. A negative result does not preclude SARS-CoV-2 infection  and should not be used as the sole basis for treatment or other  patient management decisions.  A negative result may occur with  improper specimen collection / handling, submission of specimen other  than nasopharyngeal swab, presence of viral mutation(s) within the  areas targeted by this assay, and inadequate number of viral copies  (<250 copies / mL). A negative result must be combined with clinical  observations, patient history, and  epidemiological information. If result is POSITIVE SARS-CoV-2 target nucleic acids are DETECTED. The SARS-CoV-2 RNA is generally detectable in upper and lower  respiratory specimens dur ing the acute phase of infection.  Positive  results are indicative of active infection with SARS-CoV-2.  Clinical  correlation with patient history and other diagnostic information is  necessary to determine patient infection status.  Positive results do  not rule out bacterial infection or co-infection with other viruses. If result is PRESUMPTIVE POSTIVE SARS-CoV-2 nucleic acids MAY BE PRESENT.   A presumptive positive result was obtained on the submitted specimen  and confirmed on repeat testing.  While 2019 novel coronavirus  (SARS-CoV-2) nucleic acids may be present in the submitted sample  additional confirmatory testing may be necessary for epidemiological  and / or clinical management purposes  to differentiate between  SARS-CoV-2 and other Sarbecovirus currently known to infect humans.  If clinically indicated additional testing with an alternate test  methodology [phone]) is advised. The SARS-CoV-2 RNA is generally  detectable in upper and lower respiratory sp ecimens during the acute  phase of infection. The expected result is Negative. Fact Sheet for Patients:  StrictlyIdeas.no Fact Sheet for Healthcare Providers: BankingDealers.co.za This test is not yet approved or cleared by the [place] FDA and has been authorized for detection and/or diagnosis of SARS-CoV-2 by FDA under an Emergency Use Authorization (EUA).  This EUA will remain in effect (meaning this test can be used) for the duration of the COVID-19 declaration under Section 564(b)(1) of the Act, 21 U.S.C. section 360bbb-3(b)(1), unless the authorization is terminated or revoked sooner. Performed at [facility], [place] [address] [place], [place] [address]   MRSA PCR  Screening     Status: None   Collection Time: 08/27/19  6:54 PM   Specimen: Nasopharyngeal  Result Value Ref Range Status   MRSA by PCR NEGATIVE NEGATIVE Final    Comment:        The GeneXpert MRSA Assay (FDA approved for NASAL specimens only), is one component of a comprehensive MRSA colonization surveillance program. It is not intended to diagnose MRSA infection nor to guide or monitor treatment for MRSA infections. Performed at  [facility], [place] [address], [place], [place] [address]       Radiology Studies: No results found.   Scheduled Meds: . pantoprazole  40 mg Oral Daily   Continuous Infusions:   LOS: 8 days    Time spent: 15 minutes spent in the coordination of care today.    Jonnie Finner, DO [facility] Pager [phone]  If 7PM-7AM, please contact night-coverage [email or web address] Password [email or web address] 09/01/2019, 3:29 PM

## 2019-09-02 MED ORDER — FENTANYL BOLUS VIA INFUSION
50.0000 ug | INTRAVENOUS | Status: DC | PRN
  Filled 2019-09-02: qty 75

## 2019-09-02 MED ORDER — GLYCOPYRROLATE 0.2 MG/ML IJ SOLN
0.4000 mg | INTRAMUSCULAR | Status: DC
  Administered 2019-09-02 – 2019-09-03 (×3): 0.4 mg via INTRAVENOUS
  Filled 2019-09-02 (×3): qty 2

## 2019-09-02 MED ORDER — LORAZEPAM 2 MG/ML IJ SOLN
1.0000 mg | INTRAMUSCULAR | Status: DC
  Administered 2019-09-02 – 2019-09-03 (×3): 1 mg via INTRAVENOUS
  Filled 2019-09-02 (×3): qty 1

## 2019-09-02 MED ORDER — FENTANYL 2500MCG IN NS 250ML (10MCG/ML) PREMIX INFUSION
50.0000 ug/h | INTRAVENOUS | Status: DC
  Administered 2019-09-02: 18:00:00 50 ug/h via INTRAVENOUS
  Filled 2019-09-02 (×2): qty 250

## 2019-09-02 MED ORDER — SCOPOLAMINE 1 MG/3DAYS TD PT72
1.0000 | MEDICATED_PATCH | TRANSDERMAL | Status: DC
  Administered 2019-09-02: 16:00:00 1.5 mg via TRANSDERMAL
  Filled 2019-09-02: qty 1

## 2019-09-02 MED ORDER — GLYCOPYRROLATE 0.2 MG/ML IJ SOLN
0.3000 mg | INTRAMUSCULAR | Status: DC
  Administered 2019-09-02: 13:00:00 0.3 mg via INTRAVENOUS
  Filled 2019-09-02: qty 2

## 2019-09-02 MED ORDER — LORAZEPAM 2 MG/ML IJ SOLN
0.5000 mg | INTRAMUSCULAR | Status: DC
  Administered 2019-09-02: 13:00:00 1 mg via INTRAVENOUS
  Filled 2019-09-02: qty 1

## 2019-09-02 NOTE — Progress Notes (Signed)
Daily Progress Note   Patient Name: Bryan Turner       Date: 09/02/2019 DOB: 04-Feb-1941  Age: 78 y.o. MRN#: [id number] Attending Physician: Jonnie Finner, DO Primary Care Physician: Lawerance Cruel, MD Admit Date: 09/02/2019  Reason for Consultation/Follow-up: Non pain symptom management, Pain control and Psychosocial/spiritual support  Subjective: Somnolent. He does easily awaken to verbal stimuli. Speaks in a more weak and whispering tone compared to previous assessments. Denies pain or shortness of breath at this time. States he had a hard time last night with shortness of breath and congestion. Appetite remains poor. He is only taking sips from a spoon when requesting.   Wife and daughter at the bedside. Wife concerned with episode of congestion during the night. She reports she and daughter received a phone call and patient was in severe distress, audible secretions, and struggling to breath. She reports RN administered PRN medications and provided some suction for relief which seemed to help. She reports patient stated he does not want to go through that again and would prefer to receive medication to hopefully relieved his distress and not allow him to suffer. Support given. We discussed at length his available medications. I again discussed the use of morphine. Wife remains concern about his codeine allergy and possibility of a similar response with morphine. I educated her on the use of morphine and benadryl in case patient showed signs of reaction. She verbalized understanding and agreement to consider if he continues to struggle. We discussed scheduling medications to assist with continued comfort. Family verbalized agreement and appreciation.   Bryan Turner tearful sharing she wanted  him to be comfortable and not suffer. She is aware that patient is pending transfer to [facility] once a bed becomes available with awareness he could potentially pass away while hospitalized. Support provided to her and daughter.   All questions answered.    Length of Stay: 9  Current Medications: Scheduled Meds:  . glycopyrrolate  0.3 mg Intravenous Q4H  . LORazepam  0.5-1 mg Intravenous Q4H  . scopolamine  1 patch Transdermal Q72H    Continuous Infusions:   PRN Meds: acetaminophen, albuterol, antiseptic oral rinse, dextromethorphan-guaiFENesin, fentaNYL (SUBLIMAZE) injection, furosemide, mometasone-formoterol, ondansetron (ZOFRAN) IV, polyvinyl alcohol, Resource ThickenUp Clear  Physical Exam         -Somnolent,  easily awaken to verbal stimuli, much more weaker today, NAD -bilateral rhonchi -follows command  Vital Signs: BP 110/72 (BP Location: Right Arm)   Pulse 87   Temp 98.8 F (37.1 C) (Oral)   Resp (!) 23   Ht 6' (1.829 m)   Wt 93.2 kg   SpO2 98%   BMI 27.87 kg/m  SpO2: SpO2: 98 % O2 Device: O2 Device: Nasal Cannula O2 Flow Rate: O2 Flow Rate (L/min): 4 L/min  Intake/output summary:   Intake/Output Summary (Last 24 hours) at 09/02/2019 1146 Last data filed at 09/02/2019 0900 Gross per 24 hour  Intake 160 ml  Output -  Net 160 ml   LBM: Last BM Date: 08/30/19 Baseline Weight: Weight: 95.6 kg Most recent weight: Weight: 93.2 kg       Palliative Assessment/Data:Comfort      Patient Active Problem List   Diagnosis Date Noted  . Non-ST elevation (NSTEMI) myocardial infarction ([place])   . Congestive heart failure with left ventricular systolic dysfunction ([place])   . Diabetes mellitus type 2 in nonobese (HCC)   . CAP (community acquired pneumonia) 08/24/2019  . Acute kidney injury ([place]) 08/24/2019  . Respiratory distress 08/24/2019  . Hard of hearing   . Diabetes mellitus without complication ([place])   . Pneumonia 09/08/2019  . Leukocytosis  08/18/2019  . Type 2 diabetes mellitus without complication ([place]) [id number]  . Asthma, chronic, unspecified asthma severity, with acute exacerbation 09/05/2019  . Cholecystitis 03/28/2014  . Cholelithiasis and acute cholecystitis without obstruction 03/28/2014    Palliative Care Assessment & Plan   Recommendations/Plan:  Continue comfort care with pending transfer to [facility] once bed becomes available. Family aware patient could pass away while hospitalized.  Ativan scheduled for continued comfort/decrease anxiety  Robinul scheduled for excessive secretions  No escalation of oxygen. 2L or less is for comfort only!  PMT will continue to support and follow   Goals of Care and Additional Recommendations:  Limitations on Scope of Treatment: Full Comfort Care  Code Status:    Code Status Orders  (From admission, onward)         Start     Ordered   08/28/19 1242  Do not attempt resuscitation (DNR)  Continuous    Question Answer Comment  In the event of cardiac or respiratory ARREST Do not call a "code blue"   In the event of cardiac or respiratory ARREST Do not perform Intubation, CPR, defibrillation or ACLS   In the event of cardiac or respiratory ARREST Use medication by any route, position, wound care, and other measures to relive pain and suffering. May use oxygen, suction and manual treatment of airway obstruction as needed for comfort.      08/28/19 1249        Code Status History    Date Active Date Inactive Code Status Order ID Comments User Context   08/25/2019 1733 08/28/2019 1250 DNR [id number]  Aline August, MD ED   03/28/2014 1325 03/31/2014 1351 Full Code [id number]  Earnstine Regal, PA-C Inpatient   Advance Care Planning Activity     Prognosis:   POOR-(2 weeks or less)   Discharge Planning:  Hospice facility once bed becomes available with possibility of hospital death.  Care plan was discussed with patient's family and RN.   Thank you for  allowing the Palliative Medicine Team to assist in the care of this patient.  Total Time: 45 min.   Greater than 50%  of this time was spent counseling and  coordinating care related to the above assessment and plan.  Alda Lea, AGPCNP-BC Palliative Medicine Team   Please contact Palliative Medicine Team phone at [phone] for questions and concerns.

## 2019-09-02 NOTE — Progress Notes (Signed)
PALLIATIVE NOTE:  Received a call from wife and daughter with concerns of increased pain and discomfort and timing. Family feels the ativan is effective for his anxiety however, states patient does wake up grimacing, moaning, and appearing in pain. Family is concerned patient is suffering and also concerned with how he will do over night.   I again discussed use of morphine however, family remains somewhat hesitant. I educated family on the use of a continuous drip for comfort and hopefully allowing patient to not appear to be suffering. Wife tearful and verbalized her wishes to proceed with a fentanyl drip.   Daughter concerned about care over night and requesting to remain with her father if does not appear to be in a more comfortable state before they plan to leave. Daughter aware that she may stay over night with patient for support and should be able to leave and take her mother home and return.   Family appreciative of support and care. Family has my contact information for any further needs or concerns.   Total Time: 20 min.   Greater than 50%  of this time was spent counseling and coordinating care related to the above assessment and plan.  Alda Lea, AGPCNP-BC Palliative Medicine Team

## 2019-09-02 NOTE — Progress Notes (Signed)
Bryan Turner [facility]  PROGRESS NOTE    Bryan Turner  [id number] DOB: [birth year] DOA: 09/02/2019 PCP: Lawerance Cruel, MD   Brief Narrative:   Patient is a29 y.o.maleof asthma, DM-2, hard of hearing-who presented with progressively worsening shortness of breath-initially thought to have possible pneumonia. However developed fluid overload-further evaluation revealed significantly non-STEMI and decompensated systolic heart failure. Given poor functional status/debility and deconditioning-after extensive discussion with palliative care team-and prior physicians-he was transitioned to full comfort measures on 10/13. See below for further details.  10/17: increased pain ON 10/18: C/o lack of appetite.   Assessment & Plan:   Principal Problem:   Respiratory distress Active Problems:   Pneumonia   Leukocytosis   Type 2 diabetes mellitus without complication (HCC)   Asthma, chronic, unspecified asthma severity, with acute exacerbation   CAP (community acquired pneumonia)   Hard of hearing   Diabetes mellitus without complication ([place])   Acute kidney injury ([place])   Non-ST elevation (NSTEMI) myocardial infarction ([place])   Congestive heart failure with left ventricular systolic dysfunction (HCC)   Diabetes mellitus type 2 in nonobese ([place])  Acute hypoxic respiratory failuresecondary to aspiration pneumonia and decompensated systolic heart failure (EF 30-35% on 08/27/2019 by TTE)     - IV Lasix for comfort     - no abx     - comfort measures     - no changes for today; continue comfort measures  Non-STEMI:     - Seen by cardiology: given frailty/poor functional status, his prognosis felt to be poor.      - Briefly placed on IV heparin/aspirin/statin-but after discussion with the palliative care team-has been transitioned to full comfort measures.  PAF:      - Rate controlled     - no long term anticoag d/t comfort measures  AKI on CKD 3:     - no further workup/labs d/t comfort  measures  DM-2:     - no further glucose checks, intervention d/t comfort measures  Asthma:     - inhalers for comfort.  Palliative care:     - now comfort measures; awaiting bed opening at [facility].   Awaiting open bed at [facility]. Increase pain meds as necessary. Continue comfort care.   DVT prophylaxis: None, comfort measures Code Status: DNR Disposition Plan: To [facility] when open  Consultants:   Palliative Care  ROS:  Reports dyspnea, lack of appetite. Remainder 10-pt ROS is negative for all not previously mentioned.  Subjective: "I don't want to eat much."  Objective: Vitals:   09/01/19 0623 09/01/19 1348 09/01/19 2049 09/02/19 0553  BP: 96/83 118/75  110/72  Pulse: 88 90 89 87  Resp:   18 (!) 23  Temp: 97.9 F (36.6 C) 98 F (36.7 C)  98.8 F (37.1 C)  TempSrc: Oral Oral  Oral  SpO2: 97% 98% 97% 98%  Weight:    93.2 kg  Height:        Intake/Output Summary (Last 24 hours) at 09/02/2019 1212 Last data filed at 09/02/2019 0900 Gross per 24 hour  Intake 160 ml  Output -  Net 160 ml   Filed Weights   08/24/19 0130 08/28/19 0520 09/02/19 0553  Weight: 95.6 kg 96.4 kg 93.2 kg    Examination:  General: 78 y.o. male resting in bed in NAD Cardiovascular: RRR, +S1, S2, no m/g/r, equal pulses throughout Respiratory: upper airway transmission; b/l rhonchi GI: BS+, NDNT, no masses noted, no organomegaly noted MSK: No e/c/c Neuro:  alert to name, follows commands Psyc: Appropriate interaction and affect, calm/cooperative   Data Reviewed: I have personally reviewed following labs and imaging studies.  CBC: Recent Labs  Lab 08/27/19 0539 08/28/19 0353  WBC 25.7* 20.9*  HGB 11.1* 11.6*  HCT 34.7* 35.5*  MCV 96.9 95.9  PLT 243 [id number]   Basic Metabolic Panel: Recent Labs  Lab 08/27/19 0539 08/27/19 1714 08/28/19 0353  NA 141 143 146*  K 5.3* 5.2* 4.4  CL 111 113* 112*  CO2 16* 17* 18*  GLUCOSE 210* 229* 141*  BUN 67* 66* 63*   CREATININE 2.23* 2.04* 2.15*  CALCIUM 8.2* 8.1* 8.3*   GFR: Estimated Creatinine Clearance: 33 mL/min (A) (by C-G formula based on SCr of 2.15 mg/dL (H)). Liver Function Tests: No results for input(s): AST, ALT, ALKPHOS, BILITOT, PROT, ALBUMIN in the last 168 hours. No results for input(s): LIPASE, AMYLASE in the last 168 hours. No results for input(s): AMMONIA in the last 168 hours. Coagulation Profile: No results for input(s): INR, PROTIME in the last 168 hours. Cardiac Enzymes: No results for input(s): CKTOTAL, CKMB, CKMBINDEX, TROPONINI in the last 168 hours. BNP (last 3 results) No results for input(s): PROBNP in the last 8760 hours. HbA1C: No results for input(s): HGBA1C in the last 72 hours. CBG: Recent Labs  Lab 08/27/19 0639 08/27/19 1152 08/27/19 1650 08/27/19 2052 08/28/19 0745  GLUCAP 185* 279* 202* 169* 141*   Lipid Profile: No results for input(s): CHOL, HDL, LDLCALC, TRIG, CHOLHDL, LDLDIRECT in the last 72 hours. Thyroid Function Tests: No results for input(s): TSH, T4TOTAL, FREET4, T3FREE, THYROIDAB in the last 72 hours. Anemia Panel: No results for input(s): VITAMINB12, FOLATE, FERRITIN, TIBC, IRON, RETICCTPCT in the last 72 hours. Sepsis Labs: Recent Labs  Lab 08/27/19 1310 08/28/19 1131  PROCALCITON 0.22  --   LATICACIDVEN 4.0* 1.9    Recent Results (from the past 240 hour(s))  SARS Coronavirus 2 by RT PCR (hospital order, performed in [facility] hospital lab) Nasopharyngeal Nasopharyngeal Swab     Status: None   Collection Time: 09/07/2019  2:59 PM   Specimen: Nasopharyngeal Swab  Result Value Ref Range Status   SARS Coronavirus 2 NEGATIVE NEGATIVE Final    Comment: (NOTE) If result is NEGATIVE SARS-CoV-2 target nucleic acids are NOT DETECTED. The SARS-CoV-2 RNA is generally detectable in upper and lower  respiratory specimens during the acute phase of infection. The lowest  concentration of SARS-CoV-2 viral copies this assay can detect is 250   copies / mL. A negative result does not preclude SARS-CoV-2 infection  and should not be used as the sole basis for treatment or other  patient management decisions.  A negative result may occur with  improper specimen collection / handling, submission of specimen other  than nasopharyngeal swab, presence of viral mutation(s) within the  areas targeted by this assay, and inadequate number of viral copies  (<250 copies / mL). A negative result must be combined with clinical  observations, patient history, and epidemiological information. If result is POSITIVE SARS-CoV-2 target nucleic acids are DETECTED. The SARS-CoV-2 RNA is generally detectable in upper and lower  respiratory specimens dur ing the acute phase of infection.  Positive  results are indicative of active infection with SARS-CoV-2.  Clinical  correlation with patient history and other diagnostic information is  necessary to determine patient infection status.  Positive results do  not rule out bacterial infection or co-infection with other viruses. If result is PRESUMPTIVE POSTIVE SARS-CoV-2 nucleic acids MAY  BE PRESENT.   A presumptive positive result was obtained on the submitted specimen  and confirmed on repeat testing.  While 2019 novel coronavirus  (SARS-CoV-2) nucleic acids may be present in the submitted sample  additional confirmatory testing may be necessary for epidemiological  and / or clinical management purposes  to differentiate between  SARS-CoV-2 and other Sarbecovirus currently known to infect humans.  If clinically indicated additional testing with an alternate test  methodology [phone]) is advised. The SARS-CoV-2 RNA is generally  detectable in upper and lower respiratory sp ecimens during the acute  phase of infection. The expected result is Negative. Fact Sheet for Patients:  StrictlyIdeas.no Fact Sheet for Healthcare Providers: BankingDealers.co.za  This test is not yet approved or cleared by the [place] FDA and has been authorized for detection and/or diagnosis of SARS-CoV-2 by FDA under an Emergency Use Authorization (EUA).  This EUA will remain in effect (meaning this test can be used) for the duration of the COVID-19 declaration under Section 564(b)(1) of the Act, 21 U.S.C. section 360bbb-3(b)(1), unless the authorization is terminated or revoked sooner. Performed at [facility], [place] [address], [place], [place] [address]   Blood Culture (routine x 2)     Status: None   Collection Time: 09/06/2019  3:30 PM   Specimen: BLOOD RIGHT HAND  Result Value Ref Range Status   Specimen Description BLOOD RIGHT HAND  Final   Special Requests   Final    BOTTLES DRAWN AEROBIC AND ANAEROBIC Blood Culture results may not be optimal due to an inadequate volume of blood received in culture bottles   Culture   Final    NO GROWTH 5 DAYS Performed at [facility], [place] [address], [place], [place] [address]    Report Status 08/28/2019 FINAL  Final  Blood Culture (routine x 2)     Status: None   Collection Time: 09/01/2019  3:40 PM   Specimen: BLOOD LEFT HAND  Result Value Ref Range Status   Specimen Description BLOOD LEFT HAND  Final   Special Requests   Final    BOTTLES DRAWN AEROBIC AND ANAEROBIC Blood Culture results may not be optimal due to an inadequate volume of blood received in culture bottles   Culture   Final    NO GROWTH 5 DAYS Performed at [facility], [place] [address] [place], [place] [address]    Report Status 08/28/2019 FINAL  Final  Respiratory Panel by PCR     Status: None   Collection Time: 08/29/2019  7:58 PM   Specimen: Nasopharyngeal Swab; Respiratory  Result Value Ref Range Status   Adenovirus NOT DETECTED NOT DETECTED Final   Coronavirus 229E NOT DETECTED NOT DETECTED Final    Comment: (NOTE) The Coronavirus on the Respiratory Panel, DOES NOT test for the novel  Coronavirus (2019 nCoV)     Coronavirus HKU1 NOT DETECTED NOT DETECTED Final   Coronavirus NL63 NOT DETECTED NOT DETECTED Final   Coronavirus OC43 NOT DETECTED NOT DETECTED Final   Metapneumovirus NOT DETECTED NOT DETECTED Final   Rhinovirus / Enterovirus NOT DETECTED NOT DETECTED Final   Influenza A NOT DETECTED NOT DETECTED Final   Influenza B NOT DETECTED NOT DETECTED Final   Parainfluenza Virus 1 NOT DETECTED NOT DETECTED Final   Parainfluenza Virus 2 NOT DETECTED NOT DETECTED Final   Parainfluenza Virus 3 NOT DETECTED NOT DETECTED Final   Parainfluenza Virus 4 NOT DETECTED NOT DETECTED Final   Respiratory Syncytial Virus  NOT DETECTED NOT DETECTED Final   Bordetella pertussis NOT DETECTED NOT DETECTED Final   Chlamydophila pneumoniae NOT DETECTED NOT DETECTED Final   Mycoplasma pneumoniae NOT DETECTED NOT DETECTED Final    Comment: Performed at [facility], [place] [address], [place], [place] [address]  SARS Coronavirus 2 by RT PCR (hospital order, performed in [facility] hospital lab) Nasopharyngeal Nasopharyngeal Swab     Status: None   Collection Time: 09/13/2019  7:58 PM   Specimen: Nasopharyngeal Swab  Result Value Ref Range Status   SARS Coronavirus 2 NEGATIVE NEGATIVE Final    Comment: (NOTE) If result is NEGATIVE SARS-CoV-2 target nucleic acids are NOT DETECTED. The SARS-CoV-2 RNA is generally detectable in upper and lower  respiratory specimens during the acute phase of infection. The lowest  concentration of SARS-CoV-2 viral copies this assay can detect is 250  copies / mL. A negative result does not preclude SARS-CoV-2 infection  and should not be used as the sole basis for treatment or other  patient management decisions.  A negative result may occur with  improper specimen collection / handling, submission of specimen other  than nasopharyngeal swab, presence of viral mutation(s) within the  areas targeted by this assay, and inadequate number of viral copies  (<250 copies / mL). A negative  result must be combined with clinical  observations, patient history, and epidemiological information. If result is POSITIVE SARS-CoV-2 target nucleic acids are DETECTED. The SARS-CoV-2 RNA is generally detectable in upper and lower  respiratory specimens dur ing the acute phase of infection.  Positive  results are indicative of active infection with SARS-CoV-2.  Clinical  correlation with patient history and other diagnostic information is  necessary to determine patient infection status.  Positive results do  not rule out bacterial infection or co-infection with other viruses. If result is PRESUMPTIVE POSTIVE SARS-CoV-2 nucleic acids MAY BE PRESENT.   A presumptive positive result was obtained on the submitted specimen  and confirmed on repeat testing.  While 2019 novel coronavirus  (SARS-CoV-2) nucleic acids may be present in the submitted sample  additional confirmatory testing may be necessary for epidemiological  and / or clinical management purposes  to differentiate between  SARS-CoV-2 and other Sarbecovirus currently known to infect humans.  If clinically indicated additional testing with an alternate test  methodology [phone]) is advised. The SARS-CoV-2 RNA is generally  detectable in upper and lower respiratory sp ecimens during the acute  phase of infection. The expected result is Negative. Fact Sheet for Patients:  StrictlyIdeas.no Fact Sheet for Healthcare Providers: BankingDealers.co.za This test is not yet approved or cleared by the [place] FDA and has been authorized for detection and/or diagnosis of SARS-CoV-2 by FDA under an Emergency Use Authorization (EUA).  This EUA will remain in effect (meaning this test can be used) for the duration of the COVID-19 declaration under Section 564(b)(1) of the Act, 21 U.S.C. section 360bbb-3(b)(1), unless the authorization is terminated or revoked sooner. Performed at [facility], [place] [address], [place], [place] [address]   MRSA PCR Screening     Status: None   Collection Time: 08/27/19  6:54 PM   Specimen: Nasopharyngeal  Result Value Ref Range Status   MRSA by PCR NEGATIVE NEGATIVE Final    Comment:        The GeneXpert MRSA Assay (FDA approved for NASAL specimens only), is one component of a comprehensive MRSA colonization surveillance program. It is not intended to diagnose MRSA  infection nor to guide or monitor treatment for MRSA infections. Performed at [facility], [place] [address] [place], [place] [address]       Radiology Studies: No results found.   Scheduled Meds: . glycopyrrolate  0.3 mg Intravenous Q4H  . LORazepam  0.5-1 mg Intravenous Q4H  . scopolamine  1 patch Transdermal Q72H   Continuous Infusions:   LOS: 9 days    Time spent: 15 minutes spent in the coordination of care today.   Jonnie Finner, DO [facility] Pager [phone]  If 7PM-7AM, please contact night-coverage [email or web address] Password [email or web address] 09/02/2019, 12:12 PM

## 2019-09-16 NOTE — Death Summary Note (Signed)
.   Death Summary  Bryan Turner [id number] DOB: [birth year] DOA: 30-Aug-2019  PCP: Lawerance Cruel, MD  Admit date: 08/30/2019 Date of Death: Sep 10, 2019 Time of Death: 0533 hrs Notification: Lawerance Cruel, MD notified of death of 09-10-2019   History of present illness:  Bryan Turner is a 78 y.o. male with a history of asthma, diabetes mellitus type 2 Bryan Turner presented with complaint of dyspnea.  Patient is a41 y.o.maleof asthma, DM-2, hard of hearing-who presented with progressively worsening shortness of breath-initially thought to have possible pneumonia. However developed fluid overload-further evaluation revealed significantly non-STEMI and decompensated systolic heart failure. Given poor functional status/debility and deconditioning-after extensive discussion with palliative care team-and prior physicians-he was transitioned to full comfort measures on 10/13.   Final Diagnoses:  Acute hypoxic respiratory failure Aspiration pneumonia  Decompensated systolic heart failure (EF 30-35% on 08/27/2019 by TTE) Non-STEMI: PAF:  AKI on CKD 3: DM-2: Asthma:  Patient/family made decision to transfer to full comfort care measures. He was placed on appropriate pain/anxiety regimen. He passed while awaiting transfer to [facility] on 09/10/2019 at 0533hrs.    The results of significant diagnostics from this hospitalization (including imaging, microbiology, ancillary and laboratory) are listed below for reference.    Significant Diagnostic Studies: Dg Chest 2 View  Result Date: 08/28/2019 CLINICAL DATA:  Pt states he recently had a heart attack. Pt denies chest pain at time of imaging. Pt arrived to ED with chest pain, SOB, and A-fib x5 days ago. Hx of DM, asthma. EXAM: CHEST - 2 VIEW COMPARISON:  Chest radiograph 08/25/2019 FINDINGS: Stable cardiomediastinal contours. Persistent bilateral right-greater-than-left streaky parahilar opacities suspicious for infection  or edema. No pneumothorax. Bilateral small/moderate pleural effusions. No acute finding in the visualized skeleton. IMPRESSION: Persistent bilateral right-greater-than-left parahilar opacities, and bilateral pleural effusions, suspicious for infection and/or edema. Electronically Signed   By: Audie Pinto M.D.   On: 08/28/2019 13:36   Dg Chest 2 View  Result Date: 08/25/2019 CLINICAL DATA:  Respiratory distress: likely multifactorial I.e. asthma exacerbation or ?CAP /aspiration or chemical pneumonitis. - COVID 19 neg x2. EXAM: CHEST - 2 VIEW COMPARISON:  Chest radiograph August 30, 2019 FINDINGS: Stable cardiomediastinal contours with normal heart size. There are bilateral right greater than left interstitial opacities similar to prior, possibly representing infection or edema. Small pleural effusions. No acute finding in the visualized skeleton. IMPRESSION: Bilateral right greater than left interstitial opacities and small effusions similar to prior could represent edema or infection. Electronically Signed   By: Audie Pinto M.D.   On: 08/25/2019 15:43   Dg Chest 2 View  Result Date: 08/30/2019 CLINICAL DATA:  Shortness of breath EXAM: CHEST - 2 VIEW COMPARISON:  04/02/2014 FINDINGS: Cardiomegaly. Bilateral interstitial pulmonary opacity, most conspicuous in the perihilar right lung. There may be trace pleural effusions. Disc degenerative disease of the thoracic spine. IMPRESSION: Cardiomegaly. Bilateral interstitial pulmonary opacity, most conspicuous in the perihilar right lung. There may be trace pleural effusions. Findings are consistent with edema or infection. Electronically Signed   By: Eddie Candle M.D.   On: 30-Aug-2019 14:50   Ct Chest Wo Contrast  Result Date: 08/26/2019 CLINICAL DATA:  Worsening shortness of breath starting 5 days ago. Dry cough. Evaluate for aspiration. History of asthma diabetes. EXAM: CT CHEST WITHOUT CONTRAST TECHNIQUE: Multidetector CT imaging of the chest was  performed following the standard protocol without IV contrast. COMPARISON:  08/25/2019 chest radiograph.  No prior CT. FINDINGS: Mild motion degradation. Cardiovascular: Aortic and  branch vessel atherosclerosis. Tortuous thoracic aorta. Borderline cardiomegaly. Multivessel coronary artery atherosclerosis. Mediastinum/Nodes: No middle mediastinal adenopathy. Hilar regions poorly evaluated without intravenous contrast. Normal caliber of the esophagus. Lungs/Pleura: Moderate right and small to moderate left pleural effusion. Presumed secretions throughout the trachea. Patent airways, with compression of lower lobe regions. Bibasilar dependent consolidation. Minimal biapical septal thickening. Bilateral upper lobe peribronchovascular ground-glass and nodular airspace disease. Upper Abdomen: Cholecystectomy. Normal imaged portions of the spleen, stomach, adrenal glands. Mild renal cortical thinning bilaterally. Pancreatic atrophy. Musculoskeletal: No acute osseous abnormality. IMPRESSION: 1. Right larger than left pleural effusions. Adjacent dependent bibasilar opacities are favored to represent atelectasis. 2. Upper lung ground-glass and nodular airspace disease is favored to represent infection, including atypical etiologies. No posterior predominance within the upper lobes to strongly suggest aspiration. 3. Coronary artery atherosclerosis. Aortic Atherosclerosis (ICD10-I70.0). 4. Minimal septal thickening at the apices, which given pleural fluid, is suspicious for interstitial edema. 5. Mild motion degradation. Electronically Signed   By: Abigail Miyamoto M.D.   On: 08/26/2019 18:03   US Renal  Result Date: 08/26/2019 CLINICAL DATA:  Acute kidney injury. EXAM: RENAL / URINARY TRACT ULTRASOUND COMPLETE COMPARISON:  Abdominal ultrasound 03/28/2014 FINDINGS: Right Kidney: Renal measurements: 10.6 x 5.2 by 5.1 cm = volume: 148 mL. Diffuse mild cortical thinning. Echogenicity within normal limits. No mass or  hydronephrosis visualized. Left Kidney: Renal measurements: 10.9 x 5.0 x 5.2 cm = volume: 147 mL. Diffuse cortical thinning. Poorly visualized due to shadowing bowel gas. No definite hydronephrosis or mass. Bladder: Not visualized. Other: Incidentally noted bilateral pleural effusions. IMPRESSION: 1. Poor visualization of the left kidney due to shadowing bowel gas. Bilateral renal cortical thinning. No definite hydronephrosis or mass. 2.  Incidentally noted bilateral pleural effusions. Electronically Signed   By: Audie Pinto M.D.   On: 08/26/2019 14:04   Dg Swallowing Func-speech Pathology  Result Date: 08/24/2019 Objective Swallowing Evaluation: Type of Study: MBS-Modified Barium Swallow Study  Patient Details Name: Bryan Turner MRN: [id number] Date of Birth: Nov 24, 1940 Today's Date: 08/24/2019 Time: SLP Start Time (ACUTE ONLY): 12 -SLP Stop Time (ACUTE ONLY): 1409 SLP Time Calculation (min) (ACUTE ONLY): 14 min Past Medical History: Past Medical History: Diagnosis Date  Asthma   Diabetes mellitus without complication ([place])   Hard of hearing   wears hearing aides  Macular degeneration   Plantar fasciitis  Past Surgical History: Past Surgical History: Procedure Laterality Date  CHOLECYSTECTOMY N/A 03/28/2014  Procedure: LAPAROSCOPIC CHOLECYSTECTOMY WITH INTRAOPERATIVE CHOLANGIOGRAM;  Surgeon: [name], MD;  Location: [facility];  Service: General;  Laterality: N/A;  EYE SURGERY    HEMORRHOID SURGERY   HPI: 78 yo admitted with SOB with PNA. PMHx: asthma, DM, macular degeneration  Subjective: alert, upright in chair with spouse at bedside Assessment / Plan / Recommendation CHL IP CLINICAL IMPRESSIONS 08/24/2019 Clinical Impression Pt presents with a mild to moderate pharyngeal dysphagia of unknown etiology. Deficits c/b reduced base of tongue retraction, decreased laryngeal elevation, reduced timing and efficiency of laryngeal vestibule closure, decreased glottic closure, and diminished  sensation. This allowed for: - frequent intermittent silent penetration of thin liquids before the swallow, trace silent aspiration after the swallow - gross silent penetration and aspiration of thin liquids via straw use before the swallow.  Pt with mild vallecular and pyriform sinus residuals that decreased with second swallows. Recommend nectar thick liquids and regular consistencies with medicines in puree and safe swallow precautions. Pt okay for water in between meals following oral care. Educated pt and  spouse regarding recommendations and findings. SLP to follow up for further management.  SLP Visit Diagnosis Dysphagia, pharyngeal phase (R13.13) Attention and concentration deficit following -- Frontal lobe and executive function deficit following -- Impact on safety and function Moderate aspiration risk   CHL IP TREATMENT RECOMMENDATION 08/24/2019 Treatment Recommendations Therapy as outlined in treatment plan below   Prognosis 08/24/2019 Prognosis for Safe Diet Advancement Good Barriers to Reach Goals -- Barriers/Prognosis Comment -- CHL IP DIET RECOMMENDATION 08/24/2019 SLP Diet Recommendations Nectar thick liquid;Regular solids Liquid Administration via Cup;No straw Medication Administration Whole meds with puree Compensations Slow rate;Small sips/bites;Multiple dry swallows after each bite/sip;Clear throat intermittently Postural Changes Seated upright at 90 degrees;Remain semi-upright after after feeds/meals (Comment)   CHL IP OTHER RECOMMENDATIONS 08/24/2019 Recommended Consults -- Oral Care Recommendations Oral care BID Other Recommendations Order thickener from pharmacy   CHL IP FOLLOW UP RECOMMENDATIONS 08/24/2019 Follow up Recommendations Outpatient SLP   CHL IP FREQUENCY AND DURATION 08/24/2019 Speech Therapy Frequency (ACUTE ONLY) min 2x/week Treatment Duration 1 week      CHL IP ORAL PHASE 08/24/2019 Oral Phase WFL Oral - Pudding Teaspoon -- Oral - Pudding Cup -- Oral - Honey Teaspoon -- Oral - Honey  Cup -- Oral - Nectar Teaspoon -- Oral - Nectar Cup -- Oral - Nectar Straw -- Oral - Thin Teaspoon -- Oral - Thin Cup -- Oral - Thin Straw -- Oral - Puree -- Oral - Mech Soft -- Oral - Regular -- Oral - Multi-Consistency -- Oral - Pill -- Oral Phase - Comment --  CHL IP PHARYNGEAL PHASE 08/24/2019 Pharyngeal Phase Impaired Pharyngeal- Pudding Teaspoon -- Pharyngeal -- Pharyngeal- Pudding Cup -- Pharyngeal -- Pharyngeal- Honey Teaspoon -- Pharyngeal -- Pharyngeal- Honey Cup -- Pharyngeal -- Pharyngeal- Nectar Teaspoon -- Pharyngeal -- Pharyngeal- Nectar Cup Reduced tongue base retraction;Reduced laryngeal elevation;Pharyngeal residue - valleculae;Pharyngeal residue - pyriform Pharyngeal -- Pharyngeal- Nectar Straw -- Pharyngeal -- Pharyngeal- Thin Teaspoon -- Pharyngeal Material does not enter airway Pharyngeal- Thin Cup Reduced airway/laryngeal closure;Reduced epiglottic inversion;Trace aspiration;Penetration/Aspiration before swallow;Penetration/Apiration after swallow;Reduced tongue base retraction;Reduced laryngeal elevation Pharyngeal Material enters airway, CONTACTS cords and not ejected out;Material enters airway, remains ABOVE vocal cords and not ejected out;Material enters airway, passes BELOW cords without attempt by patient to eject out (silent aspiration) Pharyngeal- Thin Straw Moderate aspiration;Reduced epiglottic inversion;Reduced airway/laryngeal closure;Reduced tongue base retraction;Reduced laryngeal elevation;Penetration/Aspiration before swallow Pharyngeal Material enters airway, passes BELOW cords without attempt by patient to eject out (silent aspiration) Pharyngeal- Puree Reduced laryngeal elevation;Reduced tongue base retraction;Pharyngeal residue - valleculae Pharyngeal -- Pharyngeal- Mechanical Soft -- Pharyngeal -- Pharyngeal- Regular Reduced tongue base retraction;Pharyngeal residue - valleculae Pharyngeal -- Pharyngeal- Multi-consistency -- Pharyngeal -- Pharyngeal- Pill Reduced tongue base  retraction;Reduced laryngeal elevation Pharyngeal -- Pharyngeal Comment --  CHL IP CERVICAL ESOPHAGEAL PHASE 08/24/2019 Cervical Esophageal Phase WFL Pudding Teaspoon -- Pudding Cup -- Honey Teaspoon -- Honey Cup -- Nectar Teaspoon -- Nectar Cup -- Nectar Straw -- Thin Teaspoon -- Thin Cup -- Thin Straw -- Puree -- Mechanical Soft -- Regular -- Multi-consistency -- Pill -- Cervical Esophageal Comment -- Chelsea E Hartness MA, CCC-SLP Acute [facility] Services 08/24/2019, 2:47 PM               Microbiology: Recent Results (from the past 240 hour(s))  MRSA PCR Screening     Status: None   Collection Time: 08/27/19  6:54 PM   Specimen: Nasopharyngeal  Result Value Ref Range Status   MRSA by PCR NEGATIVE NEGATIVE Final    Comment:  The GeneXpert MRSA Assay (FDA approved for NASAL specimens only), is one component of a comprehensive MRSA colonization surveillance program. It is not intended to diagnose MRSA infection nor to guide or monitor treatment for MRSA infections. Performed at [facility], [place] [address], [place], [place] [address]      Labs: Basic Metabolic Panel: Recent Labs  Lab 08/27/19 1714 08/28/19 0353  NA 143 146*  K 5.2* 4.4  CL 113* 112*  CO2 17* 18*  GLUCOSE 229* 141*  BUN 66* 63*  CREATININE 2.04* 2.15*  CALCIUM 8.1* 8.3*   Liver Function Tests: No results for input(s): AST, ALT, ALKPHOS, BILITOT, PROT, ALBUMIN in the last 168 hours. No results for input(s): LIPASE, AMYLASE in the last 168 hours. No results for input(s): AMMONIA in the last 168 hours. CBC: Recent Labs  Lab 08/28/19 0353  WBC 20.9*  HGB 11.6*  HCT 35.5*  MCV 95.9  PLT 247   Cardiac Enzymes: No results for input(s): CKTOTAL, CKMB, CKMBINDEX, TROPONINI in the last 168 hours. D-Dimer No results for input(s): DDIMER in the last 72 hours. BNP: Invalid input(s): POCBNP CBG: Recent Labs  Lab 08/27/19 1650 08/27/19 2052 08/28/19 0745  GLUCAP 202* 169* 141*    Anemia work up No results for input(s): VITAMINB12, FOLATE, FERRITIN, TIBC, IRON, RETICCTPCT in the last 72 hours. Urinalysis    Component Value Date/Time   COLORURINE YELLOW 08/26/2019 1724   APPEARANCEUR HAZY (A) 08/26/2019 1724   LABSPEC 1.028 08/26/2019 1724   PHURINE 5.0 08/26/2019 1724   GLUCOSEU NEGATIVE 08/26/2019 1724   HGBUR MODERATE (A) 08/26/2019 1724   BILIRUBINUR NEGATIVE 08/26/2019 1724   KETONESUR NEGATIVE 08/26/2019 1724   PROTEINUR 30 (A) 08/26/2019 1724   UROBILINOGEN 1.0 03/30/2014 1035   NITRITE NEGATIVE 08/26/2019 1724   LEUKOCYTESUR NEGATIVE 08/26/2019 1724   Sepsis Labs Invalid input(s): PROCALCITONIN,  WBC,  LACTICIDVEN     SIGNED:  Jonnie Finner, DO  [facility] 20-Sep-2019, 4:20 PM Pager   If 7PM-7AM, please contact night-coverage [email or web address] Password [email or web address]

## 2019-09-16 NOTE — Progress Notes (Signed)
Fentanyl gtt wasted with Alver Fisher, RN in stericycle after patient deceased. 237ml bag wasted. Alver Fisher, RN and Vernie Shanks, RN. Cyril Mourning, RN charge nurse aware.

## 2019-09-16 DEATH — deceased
